# Patient Record
Sex: Female | Born: 1965 | Race: Black or African American | Hispanic: No | Smoking: Never smoker
Health system: Southern US, Community
[De-identification: ages and names within clinical notes are randomized; demographics above are authoritative.]

## PROBLEM LIST (undated history)

## (undated) DIAGNOSIS — E119 Type 2 diabetes mellitus without complications: Secondary | ICD-10-CM

## (undated) DIAGNOSIS — M1990 Unspecified osteoarthritis, unspecified site: Secondary | ICD-10-CM

## (undated) DIAGNOSIS — R600 Localized edema: Secondary | ICD-10-CM

## (undated) DIAGNOSIS — M25569 Pain in unspecified knee: Secondary | ICD-10-CM

## (undated) DIAGNOSIS — M2550 Pain in unspecified joint: Secondary | ICD-10-CM

## (undated) DIAGNOSIS — I10 Essential (primary) hypertension: Secondary | ICD-10-CM

## (undated) DIAGNOSIS — M25559 Pain in unspecified hip: Secondary | ICD-10-CM

## (undated) DIAGNOSIS — M549 Dorsalgia, unspecified: Secondary | ICD-10-CM

## (undated) DIAGNOSIS — E785 Hyperlipidemia, unspecified: Secondary | ICD-10-CM

## (undated) HISTORY — DX: Pain in unspecified hip: M25.559

## (undated) HISTORY — DX: Pain in unspecified knee: M25.569

## (undated) HISTORY — DX: Dorsalgia, unspecified: M54.9

## (undated) HISTORY — PX: APPENDECTOMY: SHX54

## (undated) HISTORY — PX: VAGINAL HYSTERECTOMY: SUR661

## (undated) HISTORY — PX: ABDOMINAL HYSTERECTOMY: SHX81

## (undated) HISTORY — DX: Pain in unspecified joint: M25.50

## (undated) HISTORY — PX: ANKLE FUSION: SHX881

## (undated) HISTORY — DX: Unspecified osteoarthritis, unspecified site: M19.90

## (undated) HISTORY — PX: TOTAL ABDOMINAL HYSTERECTOMY: SHX209

## (undated) HISTORY — DX: Localized edema: R60.0

---

## 2005-01-20 ENCOUNTER — Ambulatory Visit (HOSPITAL_COMMUNITY): Admission: RE | Admit: 2005-01-20 | Discharge: 2005-01-20 | Payer: Self-pay | Admitting: Family Medicine

## 2005-02-12 ENCOUNTER — Ambulatory Visit (HOSPITAL_COMMUNITY): Admission: RE | Admit: 2005-02-12 | Discharge: 2005-02-12 | Payer: Self-pay | Admitting: Family Medicine

## 2007-03-12 ENCOUNTER — Ambulatory Visit (HOSPITAL_COMMUNITY): Admission: RE | Admit: 2007-03-12 | Discharge: 2007-03-12 | Payer: Self-pay | Admitting: Family Medicine

## 2007-04-14 ENCOUNTER — Ambulatory Visit (HOSPITAL_COMMUNITY): Admission: RE | Admit: 2007-04-14 | Discharge: 2007-04-14 | Payer: Self-pay | Admitting: Family Medicine

## 2009-12-27 ENCOUNTER — Ambulatory Visit (HOSPITAL_COMMUNITY): Admission: RE | Admit: 2009-12-27 | Discharge: 2009-12-27 | Payer: Self-pay | Admitting: Family Medicine

## 2010-12-29 ENCOUNTER — Emergency Department (HOSPITAL_COMMUNITY)
Admission: EM | Admit: 2010-12-29 | Discharge: 2010-12-29 | Disposition: A | Discharge status: Home / Self Care | Attending: Emergency Medicine | Admitting: Emergency Medicine

## 2010-12-29 DIAGNOSIS — E785 Hyperlipidemia, unspecified: Secondary | ICD-10-CM | POA: Insufficient documentation

## 2010-12-29 DIAGNOSIS — L0501 Pilonidal cyst with abscess: Secondary | ICD-10-CM | POA: Insufficient documentation

## 2010-12-29 DIAGNOSIS — Z79899 Other long term (current) drug therapy: Secondary | ICD-10-CM | POA: Insufficient documentation

## 2011-02-26 ENCOUNTER — Other Ambulatory Visit (HOSPITAL_COMMUNITY): Payer: Self-pay | Admitting: Family Medicine

## 2011-02-26 DIAGNOSIS — Z139 Encounter for screening, unspecified: Secondary | ICD-10-CM

## 2011-03-03 ENCOUNTER — Ambulatory Visit (HOSPITAL_COMMUNITY)
Admission: RE | Admit: 2011-03-03 | Discharge: 2011-03-03 | Disposition: A | Discharge status: Home / Self Care | Source: Ambulatory Visit | Attending: Family Medicine | Admitting: Family Medicine

## 2011-03-03 DIAGNOSIS — Z1231 Encounter for screening mammogram for malignant neoplasm of breast: Secondary | ICD-10-CM | POA: Insufficient documentation

## 2011-03-03 DIAGNOSIS — Z139 Encounter for screening, unspecified: Secondary | ICD-10-CM

## 2013-03-01 ENCOUNTER — Other Ambulatory Visit (HOSPITAL_COMMUNITY): Payer: Self-pay | Admitting: Family Medicine

## 2013-03-01 DIAGNOSIS — Z139 Encounter for screening, unspecified: Secondary | ICD-10-CM

## 2013-03-07 ENCOUNTER — Ambulatory Visit (HOSPITAL_COMMUNITY)
Admission: RE | Admit: 2013-03-07 | Discharge: 2013-03-07 | Disposition: A | Discharge status: Home / Self Care | Source: Ambulatory Visit | Attending: Family Medicine | Admitting: Family Medicine

## 2013-03-07 DIAGNOSIS — Z139 Encounter for screening, unspecified: Secondary | ICD-10-CM

## 2013-03-07 DIAGNOSIS — Z1231 Encounter for screening mammogram for malignant neoplasm of breast: Secondary | ICD-10-CM | POA: Insufficient documentation

## 2014-06-04 ENCOUNTER — Encounter (HOSPITAL_COMMUNITY): Payer: Self-pay | Admitting: Emergency Medicine

## 2014-06-04 ENCOUNTER — Emergency Department (HOSPITAL_COMMUNITY)
Admission: EM | Admit: 2014-06-04 | Discharge: 2014-06-04 | Disposition: A | Discharge status: Home / Self Care | Attending: Emergency Medicine | Admitting: Emergency Medicine

## 2014-06-04 DIAGNOSIS — L03311 Cellulitis of abdominal wall: Secondary | ICD-10-CM

## 2014-06-04 DIAGNOSIS — Z79899 Other long term (current) drug therapy: Secondary | ICD-10-CM | POA: Insufficient documentation

## 2014-06-04 DIAGNOSIS — R110 Nausea: Secondary | ICD-10-CM | POA: Diagnosis not present

## 2014-06-04 DIAGNOSIS — Z792 Long term (current) use of antibiotics: Secondary | ICD-10-CM | POA: Insufficient documentation

## 2014-06-04 DIAGNOSIS — N644 Mastodynia: Secondary | ICD-10-CM | POA: Diagnosis present

## 2014-06-04 DIAGNOSIS — I10 Essential (primary) hypertension: Secondary | ICD-10-CM | POA: Insufficient documentation

## 2014-06-04 HISTORY — DX: Essential (primary) hypertension: I10

## 2014-06-04 MED ORDER — OXYCODONE-ACETAMINOPHEN 5-325 MG PO TABS: 1.0000 | ORAL_TABLET | ORAL | Status: DC | PRN

## 2014-06-04 MED ORDER — IBUPROFEN 800 MG PO TABS
800.0000 mg | ORAL_TABLET | Freq: Once | ORAL | Status: AC
  Administered 2014-06-04: 800 mg via ORAL
  Filled 2014-06-04: qty 1

## 2014-06-04 MED ORDER — DOXYCYCLINE HYCLATE 100 MG PO CAPS: 100.0000 mg | ORAL_CAPSULE | Freq: Two times a day (BID) | ORAL | Status: DC

## 2014-06-04 NOTE — ED Notes (Signed)
Pt c/o bump to right breast, pt states the area is red and painful

## 2014-06-04 NOTE — Discharge Instructions (Signed)

## 2014-06-04 NOTE — ED Provider Notes (Signed)
CSN: [id number]     Arrival date & time 06/04/14  [id number] History   First MD Initiated Contact with Patient 06/04/14 0541     Chief Complaint  Patient presents with  . Abscess      Patient is a 48 y.o. female presenting with abscess. The history is provided by the patient.  Abscess Abscess location: right breast. Abscess quality: induration, painful and redness   Duration:  1 week Progression:  Worsening Pain details:    Severity:  Moderate   Timing:  Constant   Progression:  Worsening Chronicity:  New Context: not diabetes   Relieved by:  Nothing Exacerbated by: palpation. Associated symptoms: nausea   Associated symptoms: no vomiting   Patient reports abscess just under right breast for past week No nipple discharge or discharge from abscess are reported She reports h/o abscess in past that required surgical I&D She denies h/o diabetes and reports having labs checked last week by PCP that did not reveal diabetes    Past Medical History  Diagnosis Date  . Hypertension    Past Surgical History  Procedure Laterality Date  . Abdominal hysterectomy    . Ankle fusion Right    No family history on file. History  Substance Use Topics  . Smoking status: Never Smoker   . Smokeless tobacco: Not on file  . Alcohol Use: No   OB History   Grav Para Term Preterm Abortions TAB SAB Ect Mult Living                 Review of Systems  Gastrointestinal: Positive for nausea. Negative for vomiting.  Skin: Positive for color change.      Allergies  Review of patient's allergies indicates no known allergies.  Home Medications   Prior to Admission medications   Medication Sig Start Date End Date Taking? Authorizing Provider  valsartan-hydrochlorothiazide (DIOVAN-HCT) 160-12.5 MG per tablet Take 1 tablet by mouth daily.   Yes Historical Provider, MD  doxycycline (VIBRAMYCIN) 100 MG capsule Take 1 capsule (100 mg total) by mouth 2 (two) times daily. 06/04/14   Joya Gaskinsonald W  Egon Dittus, MD  oxyCODONE-acetaminophen (PERCOCET/ROXICET) 5-325 MG per tablet Take 1 tablet by mouth every 4 (four) hours as needed for severe pain. 06/04/14   Joya Gaskinsonald W Matias Thurman, MD   BP 127/97  Pulse 120  Temp(Src) 99 F (37.2 C)  Resp 22  Ht 5\' 6"  (1.676 m)  Wt 120 lb (54.432 kg)  BMI 19.38 kg/m2  SpO2 98% Physical Exam CONSTITUTIONAL: Well developed/well nourished HEAD: Normocephalic/atraumatic EYES: EOMI ENMT: Mucous membranes moist NECK: supple no meningeal signs CV: S1/S2 noted, no murmurs/rubs/gallops noted LUNGS: Lungs are clear to auscultation bilaterally, no apparent distress ABDOMEN: soft, nontender, no rebound or guarding NEURO: Pt is awake/alert, moves all extremitiesx4 EXTREMITIES: pulses normal, full ROM SKIN: area of induration/erthema in the area below right breast in right upper quadrant.  No crepitus.  No fluctuance.  No drainage.  No erythematous streaking noted. - female chaperone present for exam PSYCH: no abnormalities of mood noted  ED Course  Procedures   EMERGENCY DEPARTMENT US SOFT TISSUE INTERPRETATION "Study: Limited Ultrasound of the noted body part in comments below"  INDICATIONS: Soft tissue infection views of the body part are obtained with a multi-frequency linear probe  PERFORMED BY:  Myself  IMAGES ARCHIVED?: Yes  SIDE:Right   BODY PART:Abdominal wall  FINDINGS: No abcess noted  LIMITATIONS:  Body Habitus  INTERPRETATION:  No abcess noted  COMMENT:  Female chaperone present  for exam  Will start on oral antibiotics, pain control, advised warm compresses and may need to return for drainage but at this time no convincing signs of abscess to drain Pt is well appearing/nontoxic and appropriate for outpatient management    MDM   Final diagnoses:  Cellulitis of abdominal wall    Nursing notes including past medical history and social history reviewed and considered in documentation     Joya Gaskinsonald W Shanta Hartner, MD 06/04/14  [phone]

## 2015-03-21 ENCOUNTER — Other Ambulatory Visit (HOSPITAL_COMMUNITY): Payer: Self-pay | Admitting: Family Medicine

## 2015-03-21 DIAGNOSIS — Z1231 Encounter for screening mammogram for malignant neoplasm of breast: Secondary | ICD-10-CM

## 2015-03-28 ENCOUNTER — Ambulatory Visit (HOSPITAL_COMMUNITY)
Admission: RE | Admit: 2015-03-28 | Discharge: 2015-03-28 | Disposition: A | Discharge status: Home / Self Care | Source: Ambulatory Visit | Attending: Family Medicine | Admitting: Family Medicine

## 2015-03-28 DIAGNOSIS — Z1231 Encounter for screening mammogram for malignant neoplasm of breast: Secondary | ICD-10-CM | POA: Diagnosis not present

## 2016-03-11 ENCOUNTER — Other Ambulatory Visit (HOSPITAL_COMMUNITY): Payer: Self-pay | Admitting: Family Medicine

## 2016-03-11 DIAGNOSIS — Z1231 Encounter for screening mammogram for malignant neoplasm of breast: Secondary | ICD-10-CM

## 2016-03-20 ENCOUNTER — Emergency Department (HOSPITAL_COMMUNITY)

## 2016-03-20 ENCOUNTER — Encounter (HOSPITAL_COMMUNITY): Payer: Self-pay | Admitting: Emergency Medicine

## 2016-03-20 ENCOUNTER — Inpatient Hospital Stay (HOSPITAL_COMMUNITY)
Admission: EM | Admit: 2016-03-20 | Discharge: 2016-03-24 | DRG: 418 | Disposition: A | Discharge status: Home / Self Care | Attending: Family Medicine | Admitting: Family Medicine

## 2016-03-20 DIAGNOSIS — I10 Essential (primary) hypertension: Secondary | ICD-10-CM | POA: Diagnosis not present

## 2016-03-20 DIAGNOSIS — D72829 Elevated white blood cell count, unspecified: Secondary | ICD-10-CM | POA: Diagnosis not present

## 2016-03-20 DIAGNOSIS — Z7984 Long term (current) use of oral hypoglycemic drugs: Secondary | ICD-10-CM | POA: Diagnosis not present

## 2016-03-20 DIAGNOSIS — K819 Cholecystitis, unspecified: Secondary | ICD-10-CM | POA: Diagnosis present

## 2016-03-20 DIAGNOSIS — E0801 Diabetes mellitus due to underlying condition with hyperosmolarity with coma: Secondary | ICD-10-CM

## 2016-03-20 DIAGNOSIS — E1165 Type 2 diabetes mellitus with hyperglycemia: Secondary | ICD-10-CM | POA: Diagnosis not present

## 2016-03-20 DIAGNOSIS — Z794 Long term (current) use of insulin: Secondary | ICD-10-CM

## 2016-03-20 DIAGNOSIS — E1159 Type 2 diabetes mellitus with other circulatory complications: Secondary | ICD-10-CM | POA: Diagnosis present

## 2016-03-20 DIAGNOSIS — E785 Hyperlipidemia, unspecified: Secondary | ICD-10-CM | POA: Diagnosis present

## 2016-03-20 DIAGNOSIS — K8000 Calculus of gallbladder with acute cholecystitis without obstruction: Secondary | ICD-10-CM | POA: Diagnosis not present

## 2016-03-20 DIAGNOSIS — K219 Gastro-esophageal reflux disease without esophagitis: Secondary | ICD-10-CM | POA: Diagnosis present

## 2016-03-20 DIAGNOSIS — Z6841 Body Mass Index (BMI) 40.0 and over, adult: Secondary | ICD-10-CM

## 2016-03-20 DIAGNOSIS — E0800 Diabetes mellitus due to underlying condition with hyperosmolarity without nonketotic hyperglycemic-hyperosmolar coma (NKHHC): Secondary | ICD-10-CM

## 2016-03-20 DIAGNOSIS — E119 Type 2 diabetes mellitus without complications: Secondary | ICD-10-CM

## 2016-03-20 DIAGNOSIS — K810 Acute cholecystitis: Secondary | ICD-10-CM | POA: Diagnosis present

## 2016-03-20 HISTORY — DX: Type 2 diabetes mellitus without complications: E11.9

## 2016-03-20 HISTORY — DX: Hyperlipidemia, unspecified: E78.5

## 2016-03-20 LAB — CBC WITH DIFFERENTIAL/PLATELET
BASOS ABS: 0 10*3/uL (ref 0.0–0.1)
BASOS PCT: 0 %
EOS PCT: 0 %
Eosinophils Absolute: 0 10*3/uL (ref 0.0–0.7)
HCT: 36.4 % (ref 36.0–46.0)
Hemoglobin: 11.9 g/dL — ABNORMAL LOW (ref 12.0–15.0)
LYMPHS PCT: 11 %
Lymphs Abs: 1.7 10*3/uL (ref 0.7–4.0)
MCH: 30.3 pg (ref 26.0–34.0)
MCHC: 32.7 g/dL (ref 30.0–36.0)
MCV: 92.6 fL (ref 78.0–100.0)
Monocytes Absolute: 1.4 10*3/uL — ABNORMAL HIGH (ref 0.1–1.0)
Monocytes Relative: 9 %
NEUTROS ABS: 12.1 10*3/uL — AB (ref 1.7–7.7)
Neutrophils Relative %: 80 %
PLATELETS: 198 10*3/uL (ref 150–400)
RBC: 3.93 MIL/uL (ref 3.87–5.11)
RDW: 13.4 % (ref 11.5–15.5)
WBC: 15.3 10*3/uL — AB (ref 4.0–10.5)

## 2016-03-20 LAB — GLUCOSE, CAPILLARY: GLUCOSE-CAPILLARY: 200 mg/dL — AB (ref 65–99)

## 2016-03-20 LAB — COMPREHENSIVE METABOLIC PANEL
ALT: 19 U/L (ref 14–54)
AST: 17 U/L (ref 15–41)
Albumin: 3.9 g/dL (ref 3.5–5.0)
Alkaline Phosphatase: 48 U/L (ref 38–126)
Anion gap: 9 (ref 5–15)
BUN: 9 mg/dL (ref 6–20)
CHLORIDE: 96 mmol/L — AB (ref 101–111)
CO2: 25 mmol/L (ref 22–32)
CREATININE: 0.6 mg/dL (ref 0.44–1.00)
Calcium: 8.6 mg/dL — ABNORMAL LOW (ref 8.9–10.3)
Glucose, Bld: 218 mg/dL — ABNORMAL HIGH (ref 65–99)
POTASSIUM: 3.2 mmol/L — AB (ref 3.5–5.1)
Sodium: 130 mmol/L — ABNORMAL LOW (ref 135–145)
TOTAL PROTEIN: 8 g/dL (ref 6.5–8.1)
Total Bilirubin: 0.9 mg/dL (ref 0.3–1.2)

## 2016-03-20 LAB — LIPASE, BLOOD: LIPASE: 17 U/L (ref 11–51)

## 2016-03-20 MED ORDER — ONDANSETRON HCL 4 MG/2ML IJ SOLN: 4.0000 mg | Freq: Four times a day (QID) | INTRAMUSCULAR | Status: DC | PRN

## 2016-03-20 MED ORDER — POTASSIUM CHLORIDE 10 MEQ/100ML IV SOLN
INTRAVENOUS | Status: AC
  Administered 2016-03-21: 10 meq
  Filled 2016-03-20: qty 100

## 2016-03-20 MED ORDER — PIPERACILLIN-TAZOBACTAM 3.375 G IVPB 30 MIN
3.3750 g | Freq: Once | INTRAVENOUS | Status: AC
  Administered 2016-03-20: 3.375 g via INTRAVENOUS
  Filled 2016-03-20: qty 50

## 2016-03-20 MED ORDER — POTASSIUM CHLORIDE 10 MEQ/100ML IV SOLN
10.0000 meq | INTRAVENOUS | Status: DC
  Administered 2016-03-20 (×2): 10 meq via INTRAVENOUS
  Filled 2016-03-20: qty 100

## 2016-03-20 MED ORDER — PIPERACILLIN-TAZOBACTAM 3.375 G IVPB
3.3750 g | Freq: Three times a day (TID) | INTRAVENOUS | Status: DC
  Administered 2016-03-21 – 2016-03-24 (×11): 3.375 g via INTRAVENOUS
  Filled 2016-03-20 (×11): qty 50

## 2016-03-20 MED ORDER — POTASSIUM CHLORIDE IN NACL 20-0.9 MEQ/L-% IV SOLN: INTRAVENOUS | Status: AC

## 2016-03-20 MED ORDER — ONDANSETRON HCL 4 MG PO TABS
4.0000 mg | ORAL_TABLET | Freq: Four times a day (QID) | ORAL | Status: DC | PRN
  Administered 2016-03-24: 4 mg via ORAL
  Filled 2016-03-20: qty 1

## 2016-03-20 MED ORDER — ENOXAPARIN SODIUM 40 MG/0.4ML ~~LOC~~ SOLN
40.0000 mg | Freq: Once | SUBCUTANEOUS | Status: AC
  Administered 2016-03-21: 40 mg via SUBCUTANEOUS
  Filled 2016-03-20: qty 0.4

## 2016-03-20 MED ORDER — SODIUM CHLORIDE 0.9 % IV SOLN
INTRAVENOUS | Status: AC
  Administered 2016-03-21: 03:00:00 via INTRAVENOUS

## 2016-03-20 MED ORDER — KETOROLAC TROMETHAMINE 30 MG/ML IJ SOLN
30.0000 mg | Freq: Four times a day (QID) | INTRAMUSCULAR | Status: DC | PRN
  Filled 2016-03-20: qty 1

## 2016-03-20 MED ORDER — INSULIN ASPART 100 UNIT/ML ~~LOC~~ SOLN
0.0000 [IU] | SUBCUTANEOUS | Status: DC
  Administered 2016-03-20: 2 [IU] via SUBCUTANEOUS
  Administered 2016-03-21 (×2): 3 [IU] via SUBCUTANEOUS

## 2016-03-20 NOTE — ED Notes (Signed)
Patient transported to Ultrasound 

## 2016-03-20 NOTE — Progress Notes (Signed)
ANTIBIOTIC CONSULT NOTE-Preliminary  Pharmacy Consult for Zosyn Indication: Intra-abdominal Infection   No Known Allergies  Patient Measurements: Height: 5\' 6"  (167.6 cm) Weight: 260 lb (117.9 kg) IBW/kg (Calculated) : 59.3  Vital Signs: Temp: 100.8 F (38.2 C) (08/10 2210) Temp Source: Oral (08/10 2210) BP: 133/79 (08/10 2210) Pulse Rate: 93 (08/10 2210)  Labs:  Recent Labs  03/20/16 1840  WBC 15.3*  HGB 11.9*  PLT 198  CREATININE 0.60    Estimated Creatinine Clearance: 111.1 mL/min (by C-G formula based on SCr of 0.8 mg/dL).  No results for input(s): VANCOTROUGH, VANCOPEAK, VANCORANDOM, GENTTROUGH, GENTPEAK, GENTRANDOM, TOBRATROUGH, TOBRAPEAK, TOBRARND, AMIKACINPEAK, AMIKACINTROU, AMIKACIN in the last 72 hours.   Microbiology: No results found for this or any previous visit (from the past 720 hour(s)).  Medical History: Past Medical History:  Diagnosis Date  . Diabetes mellitus without complication (HCC)   . Hyperlipidemia   . Hypertension    Medications:  Prescriptions Prior to Admission  Medication Sig Dispense Refill Last Dose  . aluminum hydroxide-magnesium carbonate (GAVISCON) 95-358 MG/15ML SUSP Take 30 mLs by mouth as needed for indigestion or heartburn.   03/20/2016  . metFORMIN (GLUCOPHAGE) 500 MG tablet Take 500 mg by mouth 2 (two) times daily with a meal.   03/20/2016  . omeprazole (PRILOSEC) 20 MG capsule Take 20 mg by mouth daily.   03/20/2016  . rosuvastatin (CRESTOR) 10 MG tablet Take 10 mg by mouth daily.   03/20/2016  . valsartan-hydrochlorothiazide (DIOVAN-HCT) 160-12.5 MG per tablet Take 1 tablet by mouth daily.   03/20/2016    Assessment: Okay for Protocol, initial dose given in ED.  Surgery likely planned for 03/21/2016.  Goal of Therapy:  Eradicate infection.   Plan:  Preliminary review of pertinent patient information completed.  Protocol will be initiated with Zosyn 3.375gm IV every 8 hours. Follow-up micro data, labs, vitals. Jeani HawkingAnnie  [facility] clinical pharmacist will complete review during morning rounds to assess patient and finalize treatment regimen.  Mady GemmaHayes, Lorcan Shelp R, [facility] HospitalRPH 03/20/2016,11:01 PM

## 2016-03-20 NOTE — ED Notes (Signed)
Report called to Candice [id number], RN.  Pt is stable and ready for transport.

## 2016-03-20 NOTE — H&P (Signed)
History and Physical    Terri [name] ZOX:[id number] DOB: 01-23-1966 DOA: 03/20/2016  PCP: Isabella StallingNDIEGO,RICHARD M, MD  Patient coming from: home  Chief Complaint: abdominal pain  HPI: Terri [name] is a 50 y.o. female with medical history significant of NIDDM, HTN comes in with 2 days of progressive worsening ruq abdominal pain that radiates to her back worse postprandially with associated vomiting today.  Pt thought it was her GERD but it kept getting worse.  Denies any fevers. No diarrhea.  Still has gallbladder.  Pt found to have acute cholecystitis.   Review of Systems: As per HPI otherwise 10 point review of systems negative.   Past Medical History:  Diagnosis Date  . Hypertension     Past Surgical History:  Procedure Laterality Date  . ABDOMINAL HYSTERECTOMY    . ANKLE FUSION Right      reports that she has never smoked. She has never used smokeless tobacco. She reports that she does not drink alcohol or use drugs.  No Known Allergies  No family history on file.  Prior to Admission medications   Medication Sig Start Date End Date Taking? Authorizing Provider  aluminum hydroxide-magnesium carbonate (GAVISCON) 95-358 MG/15ML SUSP Take 30 mLs by mouth as needed for indigestion or heartburn.   Yes Historical Provider, MD  metFORMIN (GLUCOPHAGE) 500 MG tablet Take 500 mg by mouth 2 (two) times daily with a meal.   Yes Historical Provider, MD  omeprazole (PRILOSEC) 20 MG capsule Take 20 mg by mouth daily.   Yes Historical Provider, MD  rosuvastatin (CRESTOR) 10 MG tablet Take 10 mg by mouth daily.   Yes Historical Provider, MD  valsartan-hydrochlorothiazide (DIOVAN-HCT) 160-12.5 MG per tablet Take 1 tablet by mouth daily.   Yes Historical Provider, MD    Physical Exam: Vitals:   03/20/16 1752 03/20/16 1840 03/20/16 1900 03/20/16 2014  BP: 149/93 142/85 155/90 150/88  Pulse: 100 110 89 97  Resp: 20 18  16   Temp: 99.4 F (37.4 C)     TempSrc: Oral     SpO2:  98% 95% 99% 98%  Weight: 117.9 kg (260 lb)     Height: 5\' 6"  (1.676 m)         Constitutional: NAD, calm, comfortable Vitals:   03/20/16 1752 03/20/16 1840 03/20/16 1900 03/20/16 2014  BP: 149/93 142/85 155/90 150/88  Pulse: 100 110 89 97  Resp: 20 18  16   Temp: 99.4 F (37.4 C)     TempSrc: Oral     SpO2: 98% 95% 99% 98%  Weight: 117.9 kg (260 lb)     Height: 5\' 6"  (1.676 m)      Eyes: PERRL, lids and conjunctivae normal ENMT: Mucous membranes are moist. Posterior pharynx clear of any exudate or lesions.Normal dentition.  Neck: normal, supple, no masses, no thyromegaly Respiratory: clear to auscultation bilaterally, no wheezing, no crackles. Normal respiratory effort. No accessory muscle use.  Cardiovascular: Regular rate and rhythm, no murmurs / rubs / gallops. No extremity edema. 2+ pedal pulses. No carotid bruits.  Abdomen: ruq tenderness, no masses palpated. No hepatosplenomegaly. Bowel sounds positive.  Musculoskeletal: no clubbing / cyanosis. No joint deformity upper and lower extremities. Good ROM, no contractures. Normal muscle tone.  Skin: no rashes, lesions, ulcers. No induration Neurologic: CN 2-12 grossly intact. Sensation intact, DTR normal. Strength 5/5 in all 4.  Psychiatric: Normal judgment and insight. Alert and oriented x 3. Normal mood.    Labs on Admission: I have personally reviewed following  labs and imaging studies  CBC:  Recent Labs Lab 03/20/16 1840  WBC 15.3*  NEUTROABS 12.1*  HGB 11.9*  HCT 36.4  MCV 92.6  PLT 198   Basic Metabolic Panel:  Recent Labs Lab 03/20/16 1840  NA 130*  K 3.2*  CL 96*  CO2 25  GLUCOSE 218*  BUN 9  CREATININE 0.60  CALCIUM 8.6*   GFR: Estimated Creatinine Clearance: 111.1 mL/min (by C-G formula based on SCr of 0.8 mg/dL). Liver Function Tests:  Recent Labs Lab 03/20/16 1840  AST 17  ALT 19  ALKPHOS 48  BILITOT 0.9  PROT 8.0  ALBUMIN 3.9    Recent Labs Lab 03/20/16 1840  LIPASE 17     Radiological Exams on Admission: Dg Chest 2 View  Result Date: 03/20/2016 CLINICAL DATA:  Right thoracic pain.  Heartburn. EXAM: CHEST  2 VIEW COMPARISON:  None. FINDINGS: The heart size and mediastinal contours are within normal limits. Both lungs are clear. The visualized skeletal structures are unremarkable. IMPRESSION: No active cardiopulmonary disease. Electronically Signed   By: Ted Mcalpine M.D.   On: 03/20/2016 19:11   US Abdomen Limited  Result Date: 03/20/2016 CLINICAL DATA:  Nausea for 4 days. EXAM: US ABDOMEN LIMITED - RIGHT UPPER QUADRANT COMPARISON:  None. FINDINGS: Gallbladder: Multiple small stones are identified in the gallbladder measuring up to 0.6 cm. There is pericholecystic fluid and gallbladder wall thickening. Sonographer reports negative Murphy's sign. There may also be a 0.8 cm gallbladder polyp. Common bile duct: Diameter: 0.7 cm Liver: The liver demonstrates increased echogenicity without focal lesion. IMPRESSION: Although sonographer of reports negative Murphy's sign, stones, wall thickening and pericholecystic fluid are compatible with acute cholecystitis. Fatty infiltration of the liver. The common bile duct is at the upper limits of normal measuring 0.7 cm but no stone is seen within the duct. Electronically Signed   By: Drusilla Kanner M.D.   On: 03/20/2016 18:35    Assessment/Plan 50 yo female with acute cholecystitis  Principal Problem:   Cholecystitis- dr Lovell Sheehan called and requested medicine admit, keep npo for surgery in the am.  Can likely go home after surgery tomorrow if all goes well.  Ivf.  Prn zofran and toradol ordered.  Zosyn.  abd exam benign and nonacute at this time.  Active Problems:   Hypertension- stable   DM (diabetes mellitus) (HCC)- hold metformin, ssi   obs on medical   DVT prophylaxis:   scd Code Status:  Full code  DAVID,RACHAL A MD [facility]  If 7PM-7AM, please contact  night-coverage [email or web address] Password [email or web address]  03/20/2016, 8:52 PM

## 2016-03-20 NOTE — ED Provider Notes (Addendum)
[facility]-EMERGENCY DEPT Provider Note   CSN: [id number] Arrival date & time: 03/20/16  1746  First Provider Contact:  First MD Initiated Contact with Patient 03/20/16 1755        History   Chief Complaint Chief Complaint  Patient presents with  . Gastroesophageal Reflux  . Back Pain    right    HPI Terri PewBarbara M Bossler is a 50 y.o. female.  HPI Patient presents with upper abdominal pain and vomiting 2 days. States it started after eating a fairly steak. Numerous episodes of vomiting yesterday. Patient states she has not had very much oral intake today. Denies current nausea. She does have upper abdominal pain but this is improved. She also is having right sided thoracic back pain started after vomiting. Denies cough or shortness of breath. Previous history of gastroesophageal reflux disease. She is currently on PPI. States she takes it regularly. Has had appendectomy in the past. No new lower extremity swelling or pain. No urinary symptoms. Past Medical History:  Diagnosis Date  . Hypertension     There are no active problems to display for this patient.   Past Surgical History:  Procedure Laterality Date  . ABDOMINAL HYSTERECTOMY    . ANKLE FUSION Right     OB History    No data available       Home Medications    Prior to Admission medications   Medication Sig Start Date End Date Taking? Authorizing Provider  aluminum hydroxide-magnesium carbonate (GAVISCON) 95-358 MG/15ML SUSP Take 30 mLs by mouth as needed for indigestion or heartburn.   Yes Historical Provider, MD  metFORMIN (GLUCOPHAGE) 500 MG tablet Take 500 mg by mouth 2 (two) times daily with a meal.   Yes Historical Provider, MD  omeprazole (PRILOSEC) 20 MG capsule Take 20 mg by mouth daily.   Yes Historical Provider, MD  rosuvastatin (CRESTOR) 10 MG tablet Take 10 mg by mouth daily.   Yes Historical Provider, MD  valsartan-hydrochlorothiazide (DIOVAN-HCT) 160-12.5 MG per tablet Take 1 tablet by mouth daily.    Yes Historical Provider, MD    Family History No family history on file.  Social History Social History  Substance Use Topics  . Smoking status: Never Smoker  . Smokeless tobacco: Never Used  . Alcohol use No     Allergies   Review of patient's allergies indicates no known allergies.   Review of Systems Review of Systems  Constitutional: Negative for chills and fever.  Respiratory: Negative for cough and shortness of breath.   Cardiovascular: Negative for chest pain.  Gastrointestinal: Positive for abdominal pain, nausea and vomiting. Negative for blood in stool, constipation and diarrhea.  Genitourinary: Negative for dysuria, flank pain, frequency and hematuria.  Musculoskeletal: Positive for back pain and myalgias. Negative for neck pain and neck stiffness.  Skin: Negative for rash and wound.  Neurological: Negative for dizziness, weakness, light-headedness, numbness and headaches.  All other systems reviewed and are negative.    Physical Exam Updated Vital Signs BP 155/90   Pulse 89   Temp 99.4 F (37.4 C) (Oral)   Resp 18   Ht 5\' 6"  (1.676 m)   Wt 260 lb (117.9 kg)   SpO2 99%   BMI 41.97 kg/m   Physical Exam  Constitutional: She is oriented to person, place, and time. She appears well-developed and well-nourished.  HENT:  Head: Normocephalic and atraumatic.  Mouth/Throat: Oropharynx is clear and moist.  Eyes: EOM are normal. Pupils are equal, round, and reactive to light.  Neck: Normal range of motion. Neck supple.  Cardiovascular: Normal rate and regular rhythm.   Pulmonary/Chest: Effort normal and breath sounds normal.  Abdominal: Soft. Bowel sounds are normal. There is tenderness (tender to palpation in the epigastric and right upper quadrants.). There is no rebound and no guarding.  Musculoskeletal: Normal range of motion. She exhibits no edema or tenderness.  No midline thoracic or lumbar tenderness. No CVA tenderness bilaterally.  Neurological: She  is alert and oriented to person, place, and time.  Results showed is without deficit. Sensation is fully intact.  Skin: Skin is warm and dry. No rash noted. No erythema.  Psychiatric: She has a normal mood and affect. Her behavior is normal.  Nursing note and vitals reviewed.    ED Treatments / Results  Labs (all labs ordered are listed, but only abnormal results are displayed) Labs Reviewed  COMPREHENSIVE METABOLIC PANEL - Abnormal; Notable for the following:       Result Value   Sodium 130 (*)    Potassium 3.2 (*)    Chloride 96 (*)    Glucose, Bld 218 (*)    Calcium 8.6 (*)    All other components within normal limits  CBC WITH DIFFERENTIAL/PLATELET - Abnormal; Notable for the following:    WBC 15.3 (*)    Hemoglobin 11.9 (*)    Neutro Abs 12.1 (*)    Monocytes Absolute 1.4 (*)    All other components within normal limits  LIPASE, BLOOD    EKG  EKG Interpretation None       Radiology Dg Chest 2 View  Result Date: 03/20/2016 CLINICAL DATA:  Right thoracic pain.  Heartburn. EXAM: CHEST  2 VIEW COMPARISON:  None. FINDINGS: The heart size and mediastinal contours are within normal limits. Both lungs are clear. The visualized skeletal structures are unremarkable. IMPRESSION: No active cardiopulmonary disease. Electronically Signed   By: Ted Mcalpine M.D.   On: 03/20/2016 19:11   US Abdomen Limited  Result Date: 03/20/2016 CLINICAL DATA:  Nausea for 4 days. EXAM: US ABDOMEN LIMITED - RIGHT UPPER QUADRANT COMPARISON:  None. FINDINGS: Gallbladder: Multiple small stones are identified in the gallbladder measuring up to 0.6 cm. There is pericholecystic fluid and gallbladder wall thickening. Sonographer reports negative Murphy's sign. There may also be a 0.8 cm gallbladder polyp. Common bile duct: Diameter: 0.7 cm Liver: The liver demonstrates increased echogenicity without focal lesion. IMPRESSION: Although sonographer of reports negative Murphy's sign, stones, wall  thickening and pericholecystic fluid are compatible with acute cholecystitis. Fatty infiltration of the liver. The common bile duct is at the upper limits of normal measuring 0.7 cm but no stone is seen within the duct. Electronically Signed   By: Drusilla Kanner M.D.   On: 03/20/2016 18:35    Procedures Procedures (including critical care time)  Medications Ordered in ED Medications  piperacillin-tazobactam (ZOSYN) IVPB 3.375 g (not administered)     Initial Impression / Assessment and Plan / ED Course  I have reviewed the triage vital signs and the nursing notes.  Pertinent labs & imaging results that were available during my care of the patient were reviewed by me and considered in my medical decision making (see chart for details).  Clinical Course  Patient's pain is controlled in the emergency department. No further vomiting. Discussed with Dr. Lovell Sheehan. Advised starting Zosyn and given patient nothing by mouth after midnight. Discussed with Dr. Onalee Hua will admit to MedSurg bed.   Final Clinical Impressions(s) / ED Diagnoses  Final diagnoses:  Cholecystitis    New Prescriptions New Prescriptions   No medications on file     Loren Racer, MD 03/20/16 1953    Loren Racer, MD 03/20/16 [id number]

## 2016-03-20 NOTE — ED Triage Notes (Signed)
Having heartburn yesterday but not currently having burning.  C/o right lung pain, rates pain 5/10.

## 2016-03-21 ENCOUNTER — Observation Stay (HOSPITAL_COMMUNITY): Admitting: Anesthesiology

## 2016-03-21 ENCOUNTER — Encounter (HOSPITAL_COMMUNITY): Payer: Self-pay | Admitting: Anesthesiology

## 2016-03-21 ENCOUNTER — Encounter (HOSPITAL_COMMUNITY)
Admission: EM | Disposition: A | Discharge status: Home / Self Care | Payer: Self-pay | Source: Home / Self Care | Attending: Family Medicine

## 2016-03-21 DIAGNOSIS — Z7984 Long term (current) use of oral hypoglycemic drugs: Secondary | ICD-10-CM | POA: Diagnosis not present

## 2016-03-21 DIAGNOSIS — I10 Essential (primary) hypertension: Secondary | ICD-10-CM | POA: Diagnosis present

## 2016-03-21 DIAGNOSIS — K810 Acute cholecystitis: Secondary | ICD-10-CM | POA: Diagnosis present

## 2016-03-21 DIAGNOSIS — E1165 Type 2 diabetes mellitus with hyperglycemia: Secondary | ICD-10-CM | POA: Diagnosis present

## 2016-03-21 DIAGNOSIS — K8000 Calculus of gallbladder with acute cholecystitis without obstruction: Secondary | ICD-10-CM | POA: Diagnosis present

## 2016-03-21 DIAGNOSIS — K219 Gastro-esophageal reflux disease without esophagitis: Secondary | ICD-10-CM | POA: Diagnosis present

## 2016-03-21 DIAGNOSIS — D72829 Elevated white blood cell count, unspecified: Secondary | ICD-10-CM | POA: Diagnosis present

## 2016-03-21 DIAGNOSIS — K819 Cholecystitis, unspecified: Secondary | ICD-10-CM | POA: Diagnosis present

## 2016-03-21 DIAGNOSIS — Z6841 Body Mass Index (BMI) 40.0 and over, adult: Secondary | ICD-10-CM | POA: Diagnosis not present

## 2016-03-21 DIAGNOSIS — E785 Hyperlipidemia, unspecified: Secondary | ICD-10-CM | POA: Diagnosis present

## 2016-03-21 HISTORY — PX: CHOLECYSTECTOMY: SHX55

## 2016-03-21 LAB — BASIC METABOLIC PANEL
Anion gap: 12 (ref 5–15)
BUN: 9 mg/dL (ref 6–20)
CHLORIDE: 96 mmol/L — AB (ref 101–111)
CO2: 27 mmol/L (ref 22–32)
CREATININE: 0.67 mg/dL (ref 0.44–1.00)
Calcium: 8.8 mg/dL — ABNORMAL LOW (ref 8.9–10.3)
GFR calc Af Amer: >60 mL/min (ref >60)
GFR calc non Af Amer: >60 mL/min (ref >60)
GLUCOSE: 226 mg/dL — AB (ref 65–99)
POTASSIUM: 3.5 mmol/L (ref 3.5–5.1)
Sodium: 135 mmol/L (ref 135–145)

## 2016-03-21 LAB — CBC
HEMATOCRIT: 34.7 % — AB (ref 36.0–46.0)
Hemoglobin: 11.3 g/dL — ABNORMAL LOW (ref 12.0–15.0)
MCH: 30.4 pg (ref 26.0–34.0)
MCHC: 32.6 g/dL (ref 30.0–36.0)
MCV: 93.3 fL (ref 78.0–100.0)
Platelets: 198 10*3/uL (ref 150–400)
RBC: 3.72 MIL/uL — ABNORMAL LOW (ref 3.87–5.11)
RDW: 13.7 % (ref 11.5–15.5)
WBC: 15.1 10*3/uL — ABNORMAL HIGH (ref 4.0–10.5)

## 2016-03-21 LAB — GLUCOSE, CAPILLARY
Glucose-Capillary: 232 mg/dL — ABNORMAL HIGH (ref 65–99)
Glucose-Capillary: 217 mg/dL — ABNORMAL HIGH (ref 65–99)
Glucose-Capillary: 190 mg/dL — ABNORMAL HIGH (ref 65–99)
Glucose-Capillary: 242 mg/dL — ABNORMAL HIGH (ref 65–99)
Glucose-Capillary: 243 mg/dL — ABNORMAL HIGH (ref 65–99)
Glucose-Capillary: 247 mg/dL — ABNORMAL HIGH (ref 65–99)

## 2016-03-21 LAB — HEMOGLOBIN AND HEMATOCRIT, BLOOD
HCT: 34.3 % — ABNORMAL LOW (ref 36.0–46.0)
HEMOGLOBIN: 10.7 g/dL — AB (ref 12.0–15.0)

## 2016-03-21 SURGERY — LAPAROSCOPIC CHOLECYSTECTOMY
Anesthesia: General

## 2016-03-21 MED ORDER — SIMETHICONE 80 MG PO CHEW: 40.0000 mg | CHEWABLE_TABLET | Freq: Four times a day (QID) | ORAL | Status: DC | PRN

## 2016-03-21 MED ORDER — ARTIFICIAL TEARS OP OINT
TOPICAL_OINTMENT | OPHTHALMIC | Status: AC
  Filled 2016-03-21: qty 7

## 2016-03-21 MED ORDER — GLYCOPYRROLATE 0.2 MG/ML IJ SOLN
INTRAMUSCULAR | Status: DC | PRN
  Administered 2016-03-21: 0.6 mg via INTRAVENOUS

## 2016-03-21 MED ORDER — GLYCOPYRROLATE 0.2 MG/ML IJ SOLN
INTRAMUSCULAR | Status: AC
  Filled 2016-03-21: qty 1

## 2016-03-21 MED ORDER — DIPHENHYDRAMINE HCL 50 MG/ML IJ SOLN: 25.0000 mg | Freq: Four times a day (QID) | INTRAMUSCULAR | Status: DC | PRN

## 2016-03-21 MED ORDER — METOPROLOL TARTRATE 5 MG/5ML IV SOLN
INTRAVENOUS | Status: AC
  Filled 2016-03-21: qty 5

## 2016-03-21 MED ORDER — ACETAMINOPHEN 325 MG PO TABS
650.0000 mg | ORAL_TABLET | Freq: Four times a day (QID) | ORAL | Status: DC | PRN
  Administered 2016-03-22 – 2016-03-23 (×2): 650 mg via ORAL
  Filled 2016-03-21 (×2): qty 2

## 2016-03-21 MED ORDER — METOPROLOL TARTRATE 5 MG/5ML IV SOLN
INTRAVENOUS | Status: DC | PRN
  Administered 2016-03-21 (×3): 1 mg via INTRAVENOUS
  Administered 2016-03-21: 2 mg via INTRAVENOUS

## 2016-03-21 MED ORDER — PROPOFOL 10 MG/ML IV BOLUS
INTRAVENOUS | Status: AC
  Filled 2016-03-21: qty 20

## 2016-03-21 MED ORDER — DIPHENHYDRAMINE HCL 25 MG PO CAPS: 25.0000 mg | ORAL_CAPSULE | Freq: Four times a day (QID) | ORAL | Status: DC | PRN

## 2016-03-21 MED ORDER — POVIDONE-IODINE 10 % OINT PACKET
TOPICAL_OINTMENT | CUTANEOUS | Status: DC | PRN
  Administered 2016-03-21: 1 via TOPICAL

## 2016-03-21 MED ORDER — ROCURONIUM BROMIDE 50 MG/5ML IV SOLN
INTRAVENOUS | Status: AC
  Filled 2016-03-21: qty 1

## 2016-03-21 MED ORDER — LIDOCAINE HCL (PF) 1 % IJ SOLN
INTRAMUSCULAR | Status: AC
  Filled 2016-03-21: qty 5

## 2016-03-21 MED ORDER — SEVOFLURANE IN SOLN
RESPIRATORY_TRACT | Status: AC
  Filled 2016-03-21: qty 250

## 2016-03-21 MED ORDER — FENTANYL CITRATE (PF) 100 MCG/2ML IJ SOLN
INTRAMUSCULAR | Status: AC
  Filled 2016-03-21: qty 2

## 2016-03-21 MED ORDER — CHLORHEXIDINE GLUCONATE CLOTH 2 % EX PADS
6.0000 | MEDICATED_PAD | Freq: Once | CUTANEOUS | Status: AC
  Administered 2016-03-21: 6 via TOPICAL

## 2016-03-21 MED ORDER — HYDROMORPHONE HCL 1 MG/ML IJ SOLN: 0.2500 mg | INTRAMUSCULAR | Status: DC | PRN

## 2016-03-21 MED ORDER — NEOSTIGMINE METHYLSULFATE 10 MG/10ML IV SOLN
INTRAVENOUS | Status: AC
  Filled 2016-03-21: qty 1

## 2016-03-21 MED ORDER — MIDAZOLAM HCL 2 MG/2ML IJ SOLN
INTRAMUSCULAR | Status: AC
  Filled 2016-03-21: qty 2

## 2016-03-21 MED ORDER — KETOROLAC TROMETHAMINE 30 MG/ML IJ SOLN
30.0000 mg | Freq: Once | INTRAMUSCULAR | Status: AC
  Administered 2016-03-21: 30 mg via INTRAVENOUS

## 2016-03-21 MED ORDER — DEXAMETHASONE SODIUM PHOSPHATE 4 MG/ML IJ SOLN
4.0000 mg | Freq: Once | INTRAMUSCULAR | Status: AC
  Administered 2016-03-21: 4 mg via INTRAVENOUS

## 2016-03-21 MED ORDER — BUPIVACAINE HCL (PF) 0.5 % IJ SOLN
INTRAMUSCULAR | Status: AC
  Filled 2016-03-21: qty 30

## 2016-03-21 MED ORDER — PROPOFOL 10 MG/ML IV BOLUS
INTRAVENOUS | Status: DC | PRN
  Administered 2016-03-21: 200 mg via INTRAVENOUS

## 2016-03-21 MED ORDER — NEOSTIGMINE METHYLSULFATE 10 MG/10ML IV SOLN
INTRAVENOUS | Status: DC | PRN
  Administered 2016-03-21: 4 mg via INTRAVENOUS

## 2016-03-21 MED ORDER — ACETAMINOPHEN 650 MG RE SUPP: 650.0000 mg | Freq: Four times a day (QID) | RECTAL | Status: DC | PRN

## 2016-03-21 MED ORDER — VALSARTAN-HYDROCHLOROTHIAZIDE 160-12.5 MG PO TABS: 1.0000 | ORAL_TABLET | Freq: Every day | ORAL | Status: DC

## 2016-03-21 MED ORDER — DEXAMETHASONE SODIUM PHOSPHATE 4 MG/ML IJ SOLN
INTRAMUSCULAR | Status: AC
  Filled 2016-03-21: qty 1

## 2016-03-21 MED ORDER — HEMOSTATIC AGENTS (NO CHARGE) OPTIME
TOPICAL | Status: DC | PRN
  Administered 2016-03-21 (×2): 1 via TOPICAL

## 2016-03-21 MED ORDER — LORAZEPAM 2 MG/ML IJ SOLN: 1.0000 mg | INTRAMUSCULAR | Status: DC | PRN

## 2016-03-21 MED ORDER — ONDANSETRON HCL 4 MG/2ML IJ SOLN
INTRAMUSCULAR | Status: AC
  Filled 2016-03-21: qty 2

## 2016-03-21 MED ORDER — POVIDONE-IODINE 10 % EX OINT
TOPICAL_OINTMENT | CUTANEOUS | Status: AC
  Filled 2016-03-21: qty 1

## 2016-03-21 MED ORDER — SODIUM CHLORIDE 0.9 % IR SOLN
Status: DC | PRN
  Administered 2016-03-21 (×2): 3000 mL
  Administered 2016-03-21: 1000 mL

## 2016-03-21 MED ORDER — ROCURONIUM 10MG/ML (10ML) SYRINGE FOR MEDFUSION PUMP - OPTIME
INTRAVENOUS | Status: DC | PRN
  Administered 2016-03-21 (×2): 10 mg via INTRAVENOUS
  Administered 2016-03-21: 22 mg via INTRAVENOUS
  Administered 2016-03-21: 8 mg via INTRAVENOUS
  Administered 2016-03-21 (×2): 10 mg via INTRAVENOUS

## 2016-03-21 MED ORDER — INSULIN ASPART 100 UNIT/ML ~~LOC~~ SOLN: 0.0000 [IU] | SUBCUTANEOUS | Status: DC

## 2016-03-21 MED ORDER — GLYCOPYRROLATE 0.2 MG/ML IJ SOLN
INTRAMUSCULAR | Status: AC
  Filled 2016-03-21: qty 4

## 2016-03-21 MED ORDER — INSULIN ASPART 100 UNIT/ML ~~LOC~~ SOLN
0.0000 [IU] | SUBCUTANEOUS | Status: DC
  Administered 2016-03-21 – 2016-03-22 (×2): 3 [IU] via SUBCUTANEOUS
  Administered 2016-03-22: 5 [IU] via SUBCUTANEOUS
  Administered 2016-03-22: 3 [IU] via SUBCUTANEOUS

## 2016-03-21 MED ORDER — HYDROMORPHONE HCL 1 MG/ML IJ SOLN
1.0000 mg | INTRAMUSCULAR | Status: DC | PRN
  Administered 2016-03-22 – 2016-03-23 (×2): 1 mg via INTRAVENOUS
  Filled 2016-03-21 (×3): qty 1

## 2016-03-21 MED ORDER — SUCCINYLCHOLINE CHLORIDE 20 MG/ML IJ SOLN
INTRAMUSCULAR | Status: AC
  Filled 2016-03-21: qty 1

## 2016-03-21 MED ORDER — MIDAZOLAM HCL 2 MG/2ML IJ SOLN
1.0000 mg | INTRAMUSCULAR | Status: DC | PRN
  Administered 2016-03-21: 2 mg via INTRAVENOUS

## 2016-03-21 MED ORDER — BUPIVACAINE HCL (PF) 0.5 % IJ SOLN
INTRAMUSCULAR | Status: DC | PRN
  Administered 2016-03-21: 10 mL

## 2016-03-21 MED ORDER — ENOXAPARIN SODIUM 40 MG/0.4ML ~~LOC~~ SOLN
40.0000 mg | SUBCUTANEOUS | Status: DC
  Administered 2016-03-22 – 2016-03-23 (×2): 40 mg via SUBCUTANEOUS
  Filled 2016-03-21 (×3): qty 0.4

## 2016-03-21 MED ORDER — POTASSIUM CHLORIDE 10 MEQ/100ML IV SOLN
INTRAVENOUS | Status: AC
Start: 2016-03-21 — End: 2016-03-21
  Filled 2016-03-21: qty 100

## 2016-03-21 MED ORDER — SUCCINYLCHOLINE 20MG/ML (10ML) SYRINGE FOR MEDFUSION PUMP - OPTIME
INTRAMUSCULAR | Status: DC | PRN
  Administered 2016-03-21: 120 mg via INTRAVENOUS

## 2016-03-21 MED ORDER — FENTANYL CITRATE (PF) 250 MCG/5ML IJ SOLN
INTRAMUSCULAR | Status: AC
  Filled 2016-03-21: qty 5

## 2016-03-21 MED ORDER — CHLORHEXIDINE GLUCONATE CLOTH 2 % EX PADS: 6.0000 | MEDICATED_PAD | Freq: Once | CUTANEOUS | Status: DC

## 2016-03-21 MED ORDER — LIDOCAINE HCL (CARDIAC) 10 MG/ML IV SOLN
INTRAVENOUS | Status: DC | PRN
  Administered 2016-03-21: 40 mg via INTRAVENOUS

## 2016-03-21 MED ORDER — OXYCODONE-ACETAMINOPHEN 5-325 MG PO TABS: 1.0000 | ORAL_TABLET | ORAL | Status: DC | PRN

## 2016-03-21 MED ORDER — IRBESARTAN 150 MG PO TABS
150.0000 mg | ORAL_TABLET | Freq: Every day | ORAL | Status: DC
  Administered 2016-03-22 – 2016-03-24 (×3): 150 mg via ORAL
  Filled 2016-03-21 (×3): qty 1

## 2016-03-21 MED ORDER — ONDANSETRON HCL 4 MG/2ML IJ SOLN
4.0000 mg | Freq: Once | INTRAMUSCULAR | Status: AC
  Administered 2016-03-21: 4 mg via INTRAVENOUS

## 2016-03-21 MED ORDER — HYDROCHLOROTHIAZIDE 12.5 MG PO CAPS
12.5000 mg | ORAL_CAPSULE | Freq: Every day | ORAL | Status: DC
  Administered 2016-03-22 – 2016-03-24 (×3): 12.5 mg via ORAL
  Filled 2016-03-21 (×3): qty 1

## 2016-03-21 MED ORDER — FENTANYL CITRATE (PF) 100 MCG/2ML IJ SOLN
INTRAMUSCULAR | Status: DC | PRN
  Administered 2016-03-21 (×4): 50 ug via INTRAVENOUS
  Administered 2016-03-21: 100 ug via INTRAVENOUS
  Administered 2016-03-21 (×5): 50 ug via INTRAVENOUS

## 2016-03-21 MED ORDER — LACTATED RINGERS IV SOLN
INTRAVENOUS | Status: DC
  Administered 2016-03-21 (×4): via INTRAVENOUS

## 2016-03-21 SURGICAL SUPPLY — 55 items
APL SRG 38 LTWT LNG FL B (MISCELLANEOUS) ×1
APPLICATOR ARISTA FLEXITIP XL (MISCELLANEOUS) ×1 IMPLANT
APPLIER CLIP LAPSCP 10X32 DD (CLIP) ×2 IMPLANT
BAG HAMPER (MISCELLANEOUS) ×2 IMPLANT
BAG SPEC RTRVL LRG 6X4 10 (ENDOMECHANICALS) ×2
CATH ROBINSON RED A/P 16FR (CATHETERS) ×1 IMPLANT
CHLORAPREP W/TINT 26ML (MISCELLANEOUS) ×2 IMPLANT
CLOTH BEACON ORANGE TIMEOUT ST (SAFETY) ×2 IMPLANT
COVER LIGHT HANDLE STERIS (MISCELLANEOUS) ×4 IMPLANT
CUTTER FLEX LINEAR 45M (STAPLE) ×1 IMPLANT
DECANTER SPIKE VIAL GLASS SM (MISCELLANEOUS) ×2 IMPLANT
ELECT REM PT RETURN 9FT ADLT (ELECTROSURGICAL) ×2
ELECTRODE REM PT RTRN 9FT ADLT (ELECTROSURGICAL) ×1 IMPLANT
EVACUATOR DRAINAGE 10X20 100CC (DRAIN) IMPLANT
EVACUATOR SILICONE 100CC (DRAIN) ×2
FILTER SMOKE EVAC LAPAROSHD (FILTER) ×2 IMPLANT
FORMALIN 10 PREFIL 120ML (MISCELLANEOUS) ×2 IMPLANT
GAUZE SPONGE 4X4 12PLY STRL (GAUZE/BANDAGES/DRESSINGS) ×1 IMPLANT
GLOVE BIOGEL PI IND STRL 7.0 (GLOVE) ×1 IMPLANT
GLOVE BIOGEL PI INDICATOR 7.0 (GLOVE) ×3
GLOVE ECLIPSE 6.5 STRL STRAW (GLOVE) ×2 IMPLANT
GLOVE SURG SS PI 7.5 STRL IVOR (GLOVE) ×2 IMPLANT
GOWN STRL REUS W/ TWL XL LVL3 (GOWN DISPOSABLE) ×1 IMPLANT
GOWN STRL REUS W/TWL LRG LVL3 (GOWN DISPOSABLE) ×4 IMPLANT
GOWN STRL REUS W/TWL XL LVL3 (GOWN DISPOSABLE) ×2
HEMOSTAT ARISTA ABSORB 3G PWDR (MISCELLANEOUS) ×1 IMPLANT
HEMOSTAT SNOW SURGICEL 2X4 (HEMOSTASIS) ×3 IMPLANT
INST SET LAPROSCOPIC AP (KITS) ×2 IMPLANT
IV NS IRRIG 3000ML ARTHROMATIC (IV SOLUTION) ×2 IMPLANT
KIT ROOM TURNOVER APOR (KITS) ×2 IMPLANT
MANIFOLD NEPTUNE II (INSTRUMENTS) ×2 IMPLANT
NDL INSUFFLATION 14GA 120MM (NEEDLE) ×1 IMPLANT
NEEDLE INSUFFLATION 14GA 120MM (NEEDLE) ×2 IMPLANT
NS IRRIG 1000ML POUR BTL (IV SOLUTION) ×2 IMPLANT
PACK LAP CHOLE LZT030E (CUSTOM PROCEDURE TRAY) ×2 IMPLANT
PAD ARMBOARD 7.5X6 YLW CONV (MISCELLANEOUS) ×2 IMPLANT
PENCIL HANDSWITCHING (ELECTRODE) ×1 IMPLANT
POUCH SPECIMEN RETRIEVAL 10MM (ENDOMECHANICALS) ×3 IMPLANT
RELOAD STAPLE 45 3.5 BLU ETS (ENDOMECHANICALS) IMPLANT
RELOAD STAPLE TA45 3.5 REG BLU (ENDOMECHANICALS) ×4 IMPLANT
SET BASIN LINEN APH (SET/KITS/TRAYS/PACK) ×2 IMPLANT
SET TUBE IRRIG SUCTION NO TIP (IRRIGATION / IRRIGATOR) ×1 IMPLANT
SLEEVE ENDOPATH XCEL 5M (ENDOMECHANICALS) ×2 IMPLANT
SPONGE DRAIN TRACH 4X4 STRL 2S (GAUZE/BANDAGES/DRESSINGS) ×1 IMPLANT
SPONGE LAP 18X18 X RAY DECT (DISPOSABLE) ×1 IMPLANT
STAPLER VISISTAT (STAPLE) ×2 IMPLANT
SUT ETHILON 3 0 FSL (SUTURE) ×1 IMPLANT
SUT VICRYL 0 UR6 27IN ABS (SUTURE) ×2 IMPLANT
TROCAR ENDO BLADELESS 11MM (ENDOMECHANICALS) ×2 IMPLANT
TROCAR XCEL NON-BLD 5MMX100MML (ENDOMECHANICALS) ×2 IMPLANT
TROCAR XCEL UNIV SLVE 11M 100M (ENDOMECHANICALS) ×2 IMPLANT
TUBE CONNECTING 12X1/4 (SUCTIONS) ×2 IMPLANT
TUBING INSUFFLATION (TUBING) ×2 IMPLANT
WARMER LAPAROSCOPE (MISCELLANEOUS) ×2 IMPLANT
YANKAUER SUCT BULB TIP 10FT TU (MISCELLANEOUS) ×1 IMPLANT

## 2016-03-21 NOTE — Progress Notes (Signed)
Inpatient Diabetes Program Recommendations  AACE/ADA: New Consensus Statement on Inpatient Glycemic Control (2015)  Target Ranges:  Prepandial:   less than 140 mg/dL      Peak postprandial:   less than 180 mg/dL (1-2 hours)      Critically ill patients:  140 - 180 mg/dL   Results for Nance PewDICKERSON, Dulcey M (MRN [id number]) as of 03/21/2016 08:16  Ref. Range 03/20/2016 22:57 03/21/2016 04:27 03/21/2016 07:37  Glucose-Capillary Latest Ref Range: 65 - 99 mg/dL 409200 (H)  Novolog 2 units 232 (H)  Novolog 3 units 217 (H)   Review of Glycemic Control  Diabetes history: DM2 Outpatient Diabetes medications: Metformin 500 mg BID Current orders for Inpatient glycemic control: Novolog 0-9 units Q4H  Inpatient Diabetes Program Recommendations: Insulin - Basal: Please consider ordering low dose basal insulin. Recommend starting with Lantus 10 units Q24H. HgbA1C: Please add an A1C on to blood in lab to evaluate glycemic control over the past 2-3 months.  Thanks, Orlando PennerMarie Mailynn Everly, RN, MSN, CDE Diabetes Coordinator Inpatient Diabetes Program [phone] (Team Pager from 8am to 5pm) [phone] ([facility] office) [phone] [facility] office) [phone] [facility] office)

## 2016-03-21 NOTE — Care Management Note (Signed)
Case Management Note  Patient Details  Name: Nance PewBarbara M Streeper MRN: [id number] Date of Birth: 03-Feb-1966  Pt reviewed for needs. She is admitted for cholecystitis and is undergoing lap chole today. Pt is from home, lives alone and is ind with ADL's. Pt has PCP, Oval Linseyichard Dondiego, and insurance with drug coverage. Anticipate pt will return home with self care in next 24 hrs. No CM needs anticipated.   Expected Discharge Date:    03/22/2016              Expected Discharge Plan:  Home/Self Care  In-House Referral:  NA  Discharge planning Services  CM Consult  Post Acute Care Choice:  NA Choice offered to:  NA  DME Arranged:    DME Agency:     HH Arranged:    HH Agency:     Status of Service:  Completed, signed off  If discussed at [employer] Length of Stay Meetings, dates discussed:    Additional Comments:  Malcolm MetroChildress, Paetyn Pietrzak Demske, RN 03/21/2016, 2:54 PM

## 2016-03-21 NOTE — Transfer of Care (Signed)
Immediate Anesthesia Transfer of Care Note  Patient: Terri PewBarbara M Wubben  Procedure(s) Performed: Procedure(s): LAPAROSCOPIC CHOLECYSTECTOMY (N/A)  Patient Location: PACU  Anesthesia Type:General  Level of Consciousness: awake and alert   Airway & Oxygen Therapy: Patient Spontanous Breathing and Patient connected to face mask oxygen  Post-op Assessment: Report given to RN  Post vital signs: Reviewed and stable  Last Vitals:  Vitals:   03/21/16 1220 03/21/16 1225  BP: 131/74 (!) 141/75  Pulse:    Resp: (!) 21 (!) 21  Temp:      Last Pain:  Vitals:   03/21/16 1210  TempSrc:   PainSc: Asleep      Patients Stated Pain Goal: 2 (03/21/16 1030)  Complications: No apparent anesthesia complications

## 2016-03-21 NOTE — Progress Notes (Signed)
Pt back from OR. VSS. Incision intact. Lesly Dukesachel J Everett, RN

## 2016-03-21 NOTE — Consult Note (Signed)
Reason for Consult: Cholecystitis, cholelithiasis Referring Physician: Dr. Lorriane Shire  Terri [name] is an 50 y.o. female.  HPI: Patient is a 50 year old black female who presented emergency room with worsening right upper quadrant abdominal pain, nausea, vomiting after having a fatty meal. This has been going on for several days. This was her first episode. She denies any fever, chills, or jaundice. The pain was persistent which required emergency room visits and IV pain medications. Ultrasound the gallbladder reveals cholecystitis with cholelithiasis. Common bile duct show no evidence of choledocholithiasis. Her white blood cell count was noted to be elevated, though she was admitted to the hospital for further evaluation and treatment.  Past Medical History:  Diagnosis Date  . Diabetes mellitus without complication ([place])   . Hyperlipidemia   . Hypertension     Past Surgical History:  Procedure Laterality Date  . ABDOMINAL HYSTERECTOMY    . ANKLE FUSION Right     No family history on file.  Social History:  reports that she has never smoked. She has never used smokeless tobacco. She reports that she does not drink alcohol or use drugs.  Allergies: No Known Allergies  Medications:  Scheduled: . sodium chloride   Intravenous STAT  . Chlorhexidine Gluconate Cloth  6 each Topical Once  . enoxaparin (LOVENOX) injection  40 mg Subcutaneous Once  . insulin aspart  0-9 Units Subcutaneous Q4H  . piperacillin-tazobactam (ZOSYN)  IV  3.375 g Intravenous Q8H  . potassium chloride       Continuous: . 0.9 % NaCl with KCl 20 mEq / L      Results for orders placed or performed during the hospital encounter of 03/20/16 (from the past 48 hour(s))  Comprehensive metabolic panel     Status: Abnormal   Collection Time: 03/20/16  6:40 PM  Result Value Ref Range   Sodium 130 (L) 135 - 145 mmol/L   Potassium 3.2 (L) 3.5 - 5.1 mmol/L   Chloride 96 (L) 101 - 111 mmol/L   CO2 25 22 - 32  mmol/L   Glucose, Bld 218 (H) 65 - 99 mg/dL   BUN 9 6 - 20 mg/dL   Creatinine, Ser 0.60 0.44 - 1.00 mg/dL   Calcium 8.6 (L) 8.9 - 10.3 mg/dL   Total Protein 8.0 6.5 - 8.1 g/dL   Albumin 3.9 3.5 - 5.0 g/dL   AST 17 15 - 41 U/L   ALT 19 14 - 54 U/L   Alkaline Phosphatase 48 38 - 126 U/L   Total Bilirubin 0.9 0.3 - 1.2 mg/dL   GFR calc non Af Amer >60 >60 mL/min   GFR calc Af Amer >60 >60 mL/min    Comment: (NOTE) The eGFR has been calculated using the CKD EPI equation. This calculation has not been validated in all clinical situations. eGFR's persistently <60 mL/min signify possible Chronic Kidney Disease.    Anion gap 9 5 - 15  Lipase, blood     Status: None   Collection Time: 03/20/16  6:40 PM  Result Value Ref Range   Lipase 17 11 - 51 U/L  CBC with Differential     Status: Abnormal   Collection Time: 03/20/16  6:40 PM  Result Value Ref Range   WBC 15.3 (H) 4.0 - 10.5 K/uL   RBC 3.93 3.87 - 5.11 MIL/uL   Hemoglobin 11.9 (L) 12.0 - 15.0 g/dL   HCT 36.4 36.0 - 46.0 %   MCV 92.6 78.0 - 100.0 fL   MCH  30.3 26.0 - 34.0 pg   MCHC 32.7 30.0 - 36.0 g/dL   RDW 23.7 16.2 - 17.1 %   Platelets 198 150 - 400 K/uL   Neutrophils Relative % 80 %   Neutro Abs 12.1 (H) 1.7 - 7.7 K/uL   Lymphocytes Relative 11 %   Lymphs Abs 1.7 0.7 - 4.0 K/uL   Monocytes Relative 9 %   Monocytes Absolute 1.4 (H) 0.1 - 1.0 K/uL   Eosinophils Relative 0 %   Eosinophils Absolute 0.0 0.0 - 0.7 K/uL   Basophils Relative 0 %   Basophils Absolute 0.0 0.0 - 0.1 K/uL  Glucose, capillary     Status: Abnormal   Collection Time: 03/20/16 10:57 PM  Result Value Ref Range   Glucose-Capillary 200 (H) 65 - 99 mg/dL   Comment 1 Notify RN    Comment 2 Document in Chart   Glucose, capillary     Status: Abnormal   Collection Time: 03/21/16  4:27 AM  Result Value Ref Range   Glucose-Capillary 232 (H) 65 - 99 mg/dL   Comment 1 Notify RN    Comment 2 Document in Chart   Basic metabolic panel     Status: Abnormal    Collection Time: 03/21/16  5:49 AM  Result Value Ref Range   Sodium 135 135 - 145 mmol/L   Potassium 3.5 3.5 - 5.1 mmol/L   Chloride 96 (L) 101 - 111 mmol/L   CO2 27 22 - 32 mmol/L   Glucose, Bld 226 (H) 65 - 99 mg/dL   BUN 9 6 - 20 mg/dL   Creatinine, Ser 2.68 0.44 - 1.00 mg/dL   Calcium 8.8 (L) 8.9 - 10.3 mg/dL   GFR calc non Af Amer >60 >60 mL/min   GFR calc Af Amer >60 >60 mL/min    Comment: (NOTE) The eGFR has been calculated using the CKD EPI equation. This calculation has not been validated in all clinical situations. eGFR's persistently <60 mL/min signify possible Chronic Kidney Disease.    Anion gap 12 5 - 15  CBC     Status: Abnormal   Collection Time: 03/21/16  5:49 AM  Result Value Ref Range   WBC 15.1 (H) 4.0 - 10.5 K/uL   RBC 3.72 (L) 3.87 - 5.11 MIL/uL   Hemoglobin 11.3 (L) 12.0 - 15.0 g/dL   HCT 08.2 (L) 26.3 - 26.4 %   MCV 93.3 78.0 - 100.0 fL   MCH 30.4 26.0 - 34.0 pg   MCHC 32.6 30.0 - 36.0 g/dL   RDW 76.4 15.9 - 76.8 %   Platelets 198 150 - 400 K/uL  Glucose, capillary     Status: Abnormal   Collection Time: 03/21/16  7:37 AM  Result Value Ref Range   Glucose-Capillary 217 (H) 65 - 99 mg/dL    Dg Chest 2 View  Result Date: 03/20/2016 CLINICAL DATA:  Right thoracic pain.  Heartburn. EXAM: CHEST  2 VIEW COMPARISON:  None. FINDINGS: The heart size and mediastinal contours are within normal limits. Both lungs are clear. The visualized skeletal structures are unremarkable. IMPRESSION: No active cardiopulmonary disease. Electronically Signed   By: Ted Mcalpine M.D.   On: 03/20/2016 19:11   US Abdomen Limited  Result Date: 03/20/2016 CLINICAL DATA:  Nausea for 4 days. EXAM: US ABDOMEN LIMITED - RIGHT UPPER QUADRANT COMPARISON:  None. FINDINGS: Gallbladder: Multiple small stones are identified in the gallbladder measuring up to 0.6 cm. There is pericholecystic fluid and gallbladder wall thickening. Sonographer reports  negative Murphy's sign. There may  also be a 0.8 cm gallbladder polyp. Common bile duct: Diameter: 0.7 cm Liver: The liver demonstrates increased echogenicity without focal lesion. IMPRESSION: Although sonographer of reports negative Murphy's sign, stones, wall thickening and pericholecystic fluid are compatible with acute cholecystitis. Fatty infiltration of the liver. The common bile duct is at the upper limits of normal measuring 0.7 cm but no stone is seen within the duct. Electronically Signed   By: Inge Rise M.D.   On: 03/20/2016 18:35    ROS:  Pertinent items noted in HPI and remainder of comprehensive ROS otherwise negative.  Blood pressure (!) 154/85, pulse 97, temperature 100.3 F (37.9 C), temperature source Oral, resp. rate 20, height _0  (1.676 m), weight 117.9 kg (260 lb), SpO2 95 %. Physical Exam: Pleasant black female in no acute distress. Head is normocephalic, atraumatic. Neck is supple without lymphadenopathy or carotid bruits. Lungs clear to auscultation with equal breath sounds bilaterally. Heart examination reveals regular rate and rhythm without S3, S4, murmurs. The abdomen is soft with tenderness noted in the right upper quadrant to deep palpation. No hepatosplenomegaly, masses appreciated, though examination is limited secondary to body habitus. No rigidity is noted.  Assessment/Plan: Impression: Acute cholecystitis, cholelithiasis Plan: Patient be taken to the operating room today for laparoscopic cholecystectomy. The risks and benefits of the procedure including bleeding, infection, hepatobiliary injury, the possibility of an open procedure were fully explained to the patient, who gave informed consent.  Chistine Dematteo A 03/21/2016, 10:11 AM

## 2016-03-21 NOTE — Progress Notes (Signed)
Appreciated admission notes and labs anesthesiology evaluation as well as surgical consultation. Patient is currently down undergoing cholecystectomy laparoscopically Terri [name]:[id number] DOB: 1966/02/11 DOA: 03/20/2016 PCP: Isabella StallingNDIEGO,Kyri Dai M, MD   Physical Exam: Blood pressure (!) 141/75, pulse 97, temperature 99.5 F (37.5 C), temperature source Oral, resp. rate (!) 21, height 5\' 6"  (1.676 [name]), weight 117.9 kg (260 lb), SpO2 95 %. Patient currently in operating room as I dictate this chart   Investigations:  No results found for this or any previous visit (from the past 240 hour(s)).   Basic Metabolic Panel:  Recent Labs  81/19/1407/05/28 1840 03/21/16 0549  NA 130* 135  K 3.2* 3.5  CL 96* 96*  CO2 25 27  GLUCOSE 218* 226*  BUN 9 9  CREATININE 0.60 0.67  CALCIUM 8.6* 8.8*   Liver Function Tests:  Recent Labs  03/20/16 1840  AST 17  ALT 19  ALKPHOS 48  BILITOT 0.9  PROT 8.0  ALBUMIN 3.9     CBC:  Recent Labs  03/20/16 1840 03/21/16 0549  WBC 15.3* 15.1*  NEUTROABS 12.1*  --   HGB 11.9* 11.3*  HCT 36.4 34.7*  MCV 92.6 93.3  PLT 198 198    Dg Chest 2 View  Result Date: 03/20/2016 CLINICAL DATA:  Right thoracic pain.  Heartburn. EXAM: CHEST  2 VIEW COMPARISON:  None. FINDINGS: The heart size and mediastinal contours are within normal limits. Both lungs are clear. The visualized skeletal structures are unremarkable. IMPRESSION: No active cardiopulmonary disease. Electronically Signed   By: Ted Mcalpineobrinka  Dimitrova [name].D.   On: 03/20/2016 19:11   [place] Abdomen Limited  Result Date: 03/20/2016 CLINICAL DATA:  Nausea for 4 days. EXAM: US ABDOMEN LIMITED - RIGHT UPPER QUADRANT COMPARISON:  None. FINDINGS: Gallbladder: Multiple small stones are identified in the gallbladder measuring up to 0.6 cm. There is pericholecystic fluid and gallbladder wall thickening. Sonographer reports negative Murphy's sign. There may also be a 0.8 cm gallbladder polyp. Common bile duct:  Diameter: 0.7 cm Liver: The liver demonstrates increased echogenicity without focal lesion. IMPRESSION: Although sonographer of reports negative Murphy's sign, stones, wall thickening and pericholecystic fluid are compatible with acute cholecystitis. Fatty infiltration of the liver. The common bile duct is at the upper limits of normal measuring 0.7 cm but no stone is seen within the duct. Electronically Signed   By: Drusilla Kannerhomas  Dalessio [name].D.   On: 03/20/2016 18:35      Medications:  Impression:  Principal Problem:   Cholecystitis Active Problems:   Essential hypertension   DM (diabetes mellitus) (HCC)     Plan: We will allow surgery to order postoperative analgesia will monitor sugars and place on sliding scale for glycemic control and monitor hemodynamics  Consultants: Surgery anesthesiology   Procedures   Antibiotics:           Time spent: 20 minutes   LOS: 0 days   Terri [name]   03/21/2016, 1:30 PM

## 2016-03-21 NOTE — Anesthesia Preprocedure Evaluation (Signed)
Anesthesia Evaluation  Patient identified by MRN, date of birth, ID band Patient awake    Reviewed: Allergy & Precautions, NPO status , Patient's Chart, lab work & pertinent test results  Airway Mallampati: II  TM Distance: >3 FB Neck ROM: Full    Dental  (+) Teeth Intact   Pulmonary neg pulmonary ROS,    breath sounds clear to auscultation       Cardiovascular hypertension, Pt. on medications  Rhythm:Regular Rate:Normal     Neuro/Psych    GI/Hepatic GERD  Controlled and Medicated,  Endo/Other  diabetes, Well Controlled, Type 2, Oral Hypoglycemic AgentsMorbid obesity  Renal/GU      Musculoskeletal   Abdominal   Peds  Hematology   Anesthesia Other Findings   Reproductive/Obstetrics                             Anesthesia Physical Anesthesia Plan  ASA: II  Anesthesia Plan: General   Post-op Pain Management:    Induction: Intravenous, Rapid sequence and Cricoid pressure planned  Airway Management Planned: Oral ETT  Additional Equipment:   Intra-op Plan:   Post-operative Plan: Extubation in OR  Informed Consent: I have reviewed the patients History and Physical, chart, labs and discussed the procedure including the risks, benefits and alternatives for the proposed anesthesia with the patient or authorized representative who has indicated his/her understanding and acceptance.     Plan Discussed with:   Anesthesia Plan Comments:         Anesthesia Quick Evaluation

## 2016-03-21 NOTE — Anesthesia Postprocedure Evaluation (Signed)
Anesthesia Post Note  Patient: Terri [name]  Procedure(s) Performed: Procedure(s) (LRB): LAPAROSCOPIC CHOLECYSTECTOMY (N/A)  Patient location during evaluation: PACU Anesthesia Type: General Level of consciousness: awake and alert and oriented Pain management: pain level controlled Vital Signs Assessment: post-procedure vital signs reviewed and stable Respiratory status: spontaneous breathing Cardiovascular status: stable Postop Assessment: no signs of nausea or vomiting Anesthetic complications: no Comments: [facility] called to Dr. Jayme CloudGonzalez.    Last Vitals:  Vitals:   03/21/16 1630 03/21/16 1645  BP:    Pulse: (!) 104   Resp:  (!) 26  Temp:      Last Pain:  Vitals:   03/21/16 1645  TempSrc:   PainSc: 0-No pain                 Keyonia Gluth

## 2016-03-21 NOTE — Op Note (Signed)
Patient:  Terri PewBarbara M Levee  DOB:  07/06/1966  MRN:  [id number]   Preop Diagnosis:  Acute cholecystitis, cholelithiasis  Postop Diagnosis:  Same, empyema of gallbladder  Procedure:  Laparoscopic cholecystectomy  Surgeon:  Franky MachoMark Shaquisha Wynn, M.D.  Anes:  Gen. endotracheal  Indications:  Patient is a 50 year old black female who presents with acute cholecystitis secondary to cholelithiasis. The risks and benefits of the procedure including bleeding, infection, hepatobiliary injury, and the possibility of an open procedure were fully explained to the patient, who gave informed consent.  Procedure note:  The patient was placed the supine position. After induction of general endotracheal anesthesia, the abdomen was prepped and draped using the usual sterile technique with DuraPrep. Surgical site confirmation was performed.  A supraumbilical incision was made down to the fascia. A Veress needle was introduced into the abdominal cavity and confirmation of placement was done using the saline drop test. The abdomen was then insufflated to 16 mmHg pressure. An 11 mm trocar was introduced into the abdominal cavity under direct visualization without difficulty. The patient was placed in reverse Trendelenburg position and additional 11 mm trocar was placed the epigastric region 5 mm trochars were placed the right upper quadrant and right flank regions. The liver was inspected and noted within normal limits. The gallbladder was significantly distended, inflamed, with evidence of gangrenous changes along the wall. An incision was made into the fundus of the gallbladder and an empyema was found. This was evacuated to help facilitate mobilization of the gallbladder. The gallbladder was then retracted in a dynamic fashion in order to provide a critical view of the triangle low. This was cleared anteriorly. In addition, a dome down approach was used in a retrograde fashion in order to divide full exposure of the  infundibulum. There was a significant amount of inflammation present. The juncture of the infundibulum and the cystic duct was identified. Care was taken to avoid the common bile duct. A standard Endo GIA was placed across the juncture of the infundibulum and the cystic duct and fired. The gallbladder was then freed away from its remaining attachments using Bovie electrocautery. There was spillage of multiple stones, which were gathered using the large suction equipment. The gallbladder fossa was inspected no abnormal bleeding or bile leakage was noted.  Arista and Surgicel were placed in the gallbladder fossa. A #10 flat Jackson-Pratt drain was placed in the subhepatic space and brought through the 5 mm lateral trocar site. It was secured to skin level using 3-0 nylon interrupted suture. The right upper quadrant was copiously irrigated with normal saline.  The gallbladder was removed through the epigastric trocar site using an Endo Catch bag. All fluid and air were then evacuated from the abdominal cavity prior to removal of the trochars.  All wounds were irrigated with normal saline. All wounds were injected with 0.5% Sensorcaine. The epigastric fascia as well as supraumbilical fascia were reapproximated using 0 Vicryl interrupted sutures. All skin incisions were closed using staples. Betadine ointment and dry sterile dressings were applied.  All tape and needle counts were correct at the end of the procedure. The patient was extubated in the operating room and transferred to PACU in stable condition.  Complications:  None  EBL:  150 mL  Specimen:  Gallbladder  Drains: Jackson-Pratt drain to subhepatic space

## 2016-03-21 NOTE — Anesthesia Procedure Notes (Signed)
Procedure Name: Intubation Date/Time: 03/21/2016 12:49 PM Performed by: Glynn OctaveANIEL, Lamona Eimer E Pre-anesthesia Checklist: Patient identified, Patient being monitored, Timeout performed, Emergency Drugs available and Suction available Patient Re-evaluated:Patient Re-evaluated prior to inductionOxygen Delivery Method: Circle system utilized Preoxygenation: Pre-oxygenation with 100% oxygen Intubation Type: IV induction, Rapid sequence and Cricoid Pressure applied Ventilation: Mask ventilation without difficulty Laryngoscope Size: Mac and 3 Grade View: Grade I Tube type: Oral Tube size: 7.0 mm Number of attempts: 1 Airway Equipment and Method: Stylet Placement Confirmation: ETT inserted through vocal cords under direct vision,  positive ETCO2 and breath sounds checked- equal and bilateral Secured at: 22 cm Tube secured with: Tape Dental Injury: Teeth and Oropharynx as per pre-operative assessment

## 2016-03-22 LAB — CBC
HEMATOCRIT: 30.7 % — AB (ref 36.0–46.0)
HEMOGLOBIN: 9.9 g/dL — AB (ref 12.0–15.0)
MCH: 30.3 pg (ref 26.0–34.0)
MCHC: 32.2 g/dL (ref 30.0–36.0)
MCV: 93.9 fL (ref 78.0–100.0)
Platelets: 214 10*3/uL (ref 150–400)
RBC: 3.27 MIL/uL — AB (ref 3.87–5.11)
RDW: 13.8 % (ref 11.5–15.5)
WBC: 13 10*3/uL — AB (ref 4.0–10.5)

## 2016-03-22 LAB — HEPATIC FUNCTION PANEL
ALT: 54 U/L (ref 14–54)
AST: 70 U/L — AB (ref 15–41)
Albumin: 3 g/dL — ABNORMAL LOW (ref 3.5–5.0)
Alkaline Phosphatase: 51 U/L (ref 38–126)
Bilirubin, Direct: 0.5 mg/dL (ref 0.1–0.5)
Indirect Bilirubin: 0.8 mg/dL (ref 0.3–0.9)
TOTAL PROTEIN: 6.7 g/dL (ref 6.5–8.1)
Total Bilirubin: 1.3 mg/dL — ABNORMAL HIGH (ref 0.3–1.2)

## 2016-03-22 LAB — GLUCOSE, CAPILLARY
GLUCOSE-CAPILLARY: 221 mg/dL — AB (ref 65–99)
GLUCOSE-CAPILLARY: 225 mg/dL — AB (ref 65–99)
GLUCOSE-CAPILLARY: 238 mg/dL — AB (ref 65–99)
GLUCOSE-CAPILLARY: 264 mg/dL — AB (ref 65–99)
Glucose-Capillary: 237 mg/dL — ABNORMAL HIGH (ref 65–99)
Glucose-Capillary: 239 mg/dL — ABNORMAL HIGH (ref 65–99)

## 2016-03-22 LAB — BASIC METABOLIC PANEL
ANION GAP: 9 (ref 5–15)
BUN: 11 mg/dL (ref 6–20)
CHLORIDE: 98 mmol/L — AB (ref 101–111)
CO2: 25 mmol/L (ref 22–32)
Calcium: 7.8 mg/dL — ABNORMAL LOW (ref 8.9–10.3)
Creatinine, Ser: 0.73 mg/dL (ref 0.44–1.00)
Glucose, Bld: 235 mg/dL — ABNORMAL HIGH (ref 65–99)
POTASSIUM: 3.5 mmol/L (ref 3.5–5.1)
SODIUM: 132 mmol/L — AB (ref 135–145)

## 2016-03-22 LAB — MAGNESIUM: MAGNESIUM: 1.8 mg/dL (ref 1.7–2.4)

## 2016-03-22 LAB — PHOSPHORUS: PHOSPHORUS: 2.3 mg/dL — AB (ref 2.5–4.6)

## 2016-03-22 MED ORDER — KCL IN DEXTROSE-NACL 20-5-0.45 MEQ/L-%-% IV SOLN
INTRAVENOUS | Status: DC
  Administered 2016-03-22: 12:00:00 via INTRAVENOUS

## 2016-03-22 MED ORDER — INSULIN ASPART 100 UNIT/ML ~~LOC~~ SOLN
0.0000 [IU] | Freq: Three times a day (TID) | SUBCUTANEOUS | Status: DC
  Administered 2016-03-22 (×2): 7 [IU] via SUBCUTANEOUS
  Administered 2016-03-23: 4 [IU] via SUBCUTANEOUS
  Administered 2016-03-23 (×2): 11 [IU] via SUBCUTANEOUS
  Administered 2016-03-24 (×2): 7 [IU] via SUBCUTANEOUS

## 2016-03-22 MED ORDER — METOPROLOL TARTRATE 25 MG PO TABS
25.0000 mg | ORAL_TABLET | Freq: Two times a day (BID) | ORAL | Status: DC
  Administered 2016-03-22 – 2016-03-24 (×5): 25 mg via ORAL
  Filled 2016-03-22 (×5): qty 1

## 2016-03-22 NOTE — Progress Notes (Signed)
Status post cholecystectomy postop day 2 patient was not insulin dependent prior to admission currently is requiring significant insulin for glycemic control will have to monitor this over the next 24 hours and calculate discharge dosage alert and oriented tolerating diet Terri [name] WUJ:[id number] DOB: 1966/02/26 DOA: 03/20/2016 PCP: Terri StallingNDIEGO,Terri [name], Terri [name]   Physical Exam: Blood pressure 134/69, pulse (!) 118, temperature 100 F (37.8 C), temperature source Oral, resp. rate 20, height 5\' 6"  (1.676 [name]), weight 117.9 kg (260 lb), SpO2 93 %. Lungs clear to A&P no rales wheeze rhonchi heart regular rhythm no murmurs goes rubs abdomen soft right upper quadrant discomfort bowel sounds normoactive   Investigations:  No results found for this or any previous visit (from the past 240 hour(s)).   Basic Metabolic Panel:  Recent Labs  95/62/1307/06/28 0549 03/22/16 0552  NA 135 132*  K 3.5 3.5  CL 96* 98*  CO2 27 25  GLUCOSE 226* 235*  BUN 9 11  CREATININE 0.67 0.73  CALCIUM 8.8* 7.8*  MG  --  1.8  PHOS  --  2.3*   Liver Function Tests:  Recent Labs  03/20/16 1840 03/22/16 0552  AST 17 70*  ALT 19 54  ALKPHOS 48 51  BILITOT 0.9 1.3*  PROT 8.0 6.7  ALBUMIN 3.9 3.0*     CBC:  Recent Labs  03/20/16 1840 03/21/16 0549 03/21/16 1624 03/22/16 0552  WBC 15.3* 15.1*  --  13.0*  NEUTROABS 12.1*  --   --   --   HGB 11.9* 11.3* 10.7* 9.9*  HCT 36.4 34.7* 34.3* 30.7*  MCV 92.6 93.3  --  93.9  PLT 198 198  --  214    Dg Chest 2 View  Result Date: 03/20/2016 CLINICAL DATA:  Right thoracic pain.  Heartburn. EXAM: CHEST  2 VIEW COMPARISON:  None. FINDINGS: The heart size and mediastinal contours are within normal limits. Both lungs are clear. The visualized skeletal structures are unremarkable. IMPRESSION: No active cardiopulmonary disease. Electronically Signed   By: Ted Mcalpineobrinka  Dimitrova [name].D.   On: 03/20/2016 19:11   [place] Abdomen Limited  Result Date: 03/20/2016 CLINICAL DATA:   Nausea for 4 days. EXAM: US ABDOMEN LIMITED - RIGHT UPPER QUADRANT COMPARISON:  None. FINDINGS: Gallbladder: Multiple small stones are identified in the gallbladder measuring up to 0.6 cm. There is pericholecystic fluid and gallbladder wall thickening. Sonographer reports negative Murphy's sign. There may also be a 0.8 cm gallbladder polyp. Common bile duct: Diameter: 0.7 cm Liver: The liver demonstrates increased echogenicity without focal lesion. IMPRESSION: Although sonographer of reports negative Murphy's sign, stones, wall thickening and pericholecystic fluid are compatible with acute cholecystitis. Fatty infiltration of the liver. The common bile duct is at the upper limits of normal measuring 0.7 cm but no stone is seen within the duct. Electronically Signed   By: Drusilla Kannerhomas  Dalessio [name].D.   On: 03/20/2016 18:35      Medications:   Impression:  Principal Problem:   Cholecystitis Active Problems:   Essential hypertension   DM (diabetes mellitus) (HCC)   Acute cholecystitis     Plan: Calculate total insulin requirements and prescribed insulin as an outpatient out of bed to chair 3 times a day  Consultants: Surgery   Procedures   Antibiotics:        Time spent: 30 minutes   LOS: 1 day   Terri [name]   03/22/2016, 11:33 AM

## 2016-03-22 NOTE — Progress Notes (Signed)
Pt's HR elevated at 120'-130's. Asymptomatic. Temp 100.2. Gave tylenol and MD notified regarding HR. MD gave verbal order for Lopressor 25mg  PO BID. RN will continue to monitor. Lesly Dukesachel J Everett, RN

## 2016-03-22 NOTE — Progress Notes (Signed)
Foley D/C. Pt educated about voiding and fluid intake. RN will continue to monitor. Lesly Dukesachel J Everett, RN

## 2016-03-22 NOTE — Anesthesia Postprocedure Evaluation (Addendum)
Anesthesia Post Note  Patient: Terri [name]  Procedure(s) Performed: Procedure(s) (LRB): LAPAROSCOPIC CHOLECYSTECTOMY (N/A)  Patient location during evaluation: Nursing Unit Anesthesia Type: General Level of consciousness: awake and alert and oriented Pain management: pain level controlled Vital Signs Assessment: post-procedure vital signs reviewed and stable Respiratory status: spontaneous breathing Cardiovascular status: stable Postop Assessment: no signs of nausea or vomiting Anesthetic complications: no Comments: Spoke with RN caring for [name].  They are addressing HR issues with pain meds and IS.  She stated she will be contacting hospitalist today to update him on progress.      Last Vitals:  Vitals:   03/22/16 0600 03/22/16 1353  BP: 134/69 132/71  Pulse: (!) 118 (!) 128  Resp: 20 20  Temp: 37.8 C 37.9 C    Last Pain:  Vitals:   03/22/16 1353  TempSrc: Oral  PainSc:                  Tyrik Stetzer

## 2016-03-22 NOTE — Addendum Note (Signed)
Addendum  created 03/22/16 1408 by Moshe SalisburyKaren E Derrall Hicks, CRNA   Sign clinical note

## 2016-03-22 NOTE — Progress Notes (Signed)
1 Day Post-Op  Subjective: Patient feels fatigued. Mild incisional pain that is well controlled.  Objective: Vital signs in last 24 hours: Temp:  [98.5 F (36.9 C)-100 F (37.8 C)] 100 F (37.8 C) (08/12 0600) Pulse Rate:  [104-126] 118 (08/12 0600) Resp:  [17-26] 20 (08/12 0600) BP: (122-161)/(69-108) 134/69 (08/12 0600) SpO2:  [93 %-100 %] 93 % (08/12 0600) Last BM Date: 03/20/16  Intake/Output from previous day: 08/11 0701 - 08/12 0700 In: 4080 [P.O.:480; I.V.:3500; IV Piggyback:100] Out: 1470 [Urine:1200; Drains:120; Blood:150] Intake/Output this shift: No intake/output data recorded.  General appearance: alert, appears stated age and no distress Head: Normocephalic, without obvious abnormality, atraumatic Eyes: No scleral icterus. Resp: clear to auscultation bilaterally Cardio: regular rate and rhythm, S1, S2 normal, no murmur, click, rub or gallop GI: Soft, dressings dry and intact. JP drainage serous in nature. No bile present.  Lab Results:   Recent Labs  03/21/16 0549 03/21/16 1624 03/22/16 0552  WBC 15.1*  --  13.0*  HGB 11.3* 10.7* 9.9*  HCT 34.7* 34.3* 30.7*  PLT 198  --  214   BMET  Recent Labs  03/21/16 0549 03/22/16 0552  NA 135 132*  K 3.5 3.5  CL 96* 98*  CO2 27 25  GLUCOSE 226* 235*  BUN 9 11  CREATININE 0.67 0.73  CALCIUM 8.8* 7.8*   PT/INR No results for input(s): LABPROT, INR in the last 72 hours.  Studies/Results: Dg Chest 2 View  Result Date: 03/20/2016 CLINICAL DATA:  Right thoracic pain.  Heartburn. EXAM: CHEST  2 VIEW COMPARISON:  None. FINDINGS: The heart size and mediastinal contours are within normal limits. Both lungs are clear. The visualized skeletal structures are unremarkable. IMPRESSION: No active cardiopulmonary disease. Electronically Signed   By: Ted Mcalpineobrinka  Dimitrova M.D.   On: 03/20/2016 19:11   [place] Abdomen Limited  Result Date: 03/20/2016 CLINICAL DATA:  Nausea for 4 days. EXAM: US ABDOMEN LIMITED - RIGHT UPPER  QUADRANT COMPARISON:  None. FINDINGS: Gallbladder: Multiple small stones are identified in the gallbladder measuring up to 0.6 cm. There is pericholecystic fluid and gallbladder wall thickening. Sonographer reports negative Murphy's sign. There may also be [name] 0.8 cm gallbladder polyp. Common bile duct: Diameter: 0.7 cm Liver: The liver demonstrates increased echogenicity without focal lesion. IMPRESSION: Although sonographer of reports negative Murphy's sign, stones, wall thickening and pericholecystic fluid are compatible with acute cholecystitis. Fatty infiltration of the liver. The common bile duct is at the upper limits of normal measuring 0.7 cm but no stone is seen within the duct. Electronically Signed   By: Drusilla Kannerhomas  Dalessio M.D.   On: 03/20/2016 18:35    Anti-infectives: Anti-infectives    Start     Dose/Rate Route Frequency Ordered Stop   03/21/16 0300  piperacillin-tazobactam (ZOSYN) IVPB 3.375 g     3.375 g 12.5 mL/hr over 240 Minutes Intravenous Every 8 hours 03/20/16 2301     03/20/16 1945  piperacillin-tazobactam (ZOSYN) IVPB 3.375 g     3.375 g 100 mL/hr over 30 Minutes Intravenous  Once 03/20/16 1944 03/20/16 2032      Assessment/Plan: s/p Procedure(s): LAPAROSCOPIC CHOLECYSTECTOMY Impression: Stable on postoperative day 1. Hemoglobin stable. Liver enzyme tests reviewed. Leukocytosis starting to resolve.  Lan: Will DC Foley. Advance diet as tolerated. Continue IV Zosyn.  LOS: 1 day    Terri [name] 03/22/2016

## 2016-03-22 NOTE — Progress Notes (Signed)
Pt ambulated in hall. 2L [place]. Tolerated well. HR 153. Asymptomatic. RN will continue to monitor. Lesly Dukesachel J Everett, RN

## 2016-03-22 NOTE — Addendum Note (Signed)
Addendum  created 03/22/16 1411 by Moshe SalisburyKaren E Tray Klayman, CRNA   Sign clinical note

## 2016-03-23 LAB — BASIC METABOLIC PANEL
ANION GAP: 10 (ref 5–15)
BUN: 11 mg/dL (ref 6–20)
CHLORIDE: 92 mmol/L — AB (ref 101–111)
CO2: 30 mmol/L (ref 22–32)
Calcium: 8 mg/dL — ABNORMAL LOW (ref 8.9–10.3)
Creatinine, Ser: 0.78 mg/dL (ref 0.44–1.00)
GFR calc Af Amer: >60 mL/min (ref >60)
GLUCOSE: 232 mg/dL — AB (ref 65–99)
POTASSIUM: 3.4 mmol/L — AB (ref 3.5–5.1)
Sodium: 132 mmol/L — ABNORMAL LOW (ref 135–145)

## 2016-03-23 LAB — CBC
HEMATOCRIT: 29.6 % — AB (ref 36.0–46.0)
HEMOGLOBIN: 9.5 g/dL — AB (ref 12.0–15.0)
MCH: 30.1 pg (ref 26.0–34.0)
MCHC: 32.1 g/dL (ref 30.0–36.0)
MCV: 93.7 fL (ref 78.0–100.0)
Platelets: 218 10*3/uL (ref 150–400)
RBC: 3.16 MIL/uL — ABNORMAL LOW (ref 3.87–5.11)
RDW: 13.8 % (ref 11.5–15.5)
WBC: 14.4 10*3/uL — AB (ref 4.0–10.5)

## 2016-03-23 LAB — GLUCOSE, CAPILLARY
GLUCOSE-CAPILLARY: 286 mg/dL — AB (ref 65–99)
GLUCOSE-CAPILLARY: 166 mg/dL — AB (ref 65–99)
GLUCOSE-CAPILLARY: 216 mg/dL — AB (ref 65–99)
Glucose-Capillary: 258 mg/dL — ABNORMAL HIGH (ref 65–99)

## 2016-03-23 LAB — HEPATIC FUNCTION PANEL
ALBUMIN: 2.9 g/dL — AB (ref 3.5–5.0)
ALK PHOS: 53 U/L (ref 38–126)
ALT: 36 U/L (ref 14–54)
AST: 22 U/L (ref 15–41)
BILIRUBIN INDIRECT: 0.5 mg/dL (ref 0.3–0.9)
Bilirubin, Direct: 0.3 mg/dL (ref 0.1–0.5)
TOTAL PROTEIN: 6.8 g/dL (ref 6.5–8.1)
Total Bilirubin: 0.8 mg/dL (ref 0.3–1.2)

## 2016-03-23 MED ORDER — METFORMIN HCL 500 MG PO TABS
500.0000 mg | ORAL_TABLET | Freq: Two times a day (BID) | ORAL | Status: DC
  Administered 2016-03-23 – 2016-03-24 (×3): 500 mg via ORAL
  Filled 2016-03-23 (×3): qty 1

## 2016-03-23 MED ORDER — MAGNESIUM HYDROXIDE 400 MG/5ML PO SUSP: 30.0000 mL | Freq: Three times a day (TID) | ORAL | Status: DC | PRN

## 2016-03-23 MED ORDER — KCL IN DEXTROSE-NACL 40-5-0.45 MEQ/L-%-% IV SOLN
INTRAVENOUS | Status: DC
  Administered 2016-03-23: 11:00:00 via INTRAVENOUS

## 2016-03-23 NOTE — Progress Notes (Signed)
2 Days Post-Op  Subjective: Patient feels much better. Mild incisional pain. Tolerating diet well. Is ambulating.  Objective: Vital signs in last 24 hours: Temp:  [98.5 F (36.9 C)-100.2 F (37.9 C)] 98.5 F (36.9 C) (08/13 0458) Pulse Rate:  [110-128] 110 (08/13 0458) Resp:  [20] 20 (08/13 0458) BP: (120-132)/(71-75) 120/75 (08/13 0458) SpO2:  [92 %-96 %] 92 % (08/13 0458) Last BM Date: 03/20/16  Intake/Output from previous day: 08/12 0701 - 08/13 0700 In: 1611.3 [I.V.:1361.3; IV Piggyback:250] Out: 1780 [Urine:1700; Drains:80] Intake/Output this shift: No intake/output data recorded.  General appearance: alert, cooperative and no distress GI: Soft, incisions healing well. JP drainage sanguinous in nature, decreasing. No bile noted.  Lab Results:   Recent Labs  03/22/16 0552 03/23/16 0555  WBC 13.0* 14.4*  HGB 9.9* 9.5*  HCT 30.7* 29.6*  PLT 214 218   BMET  Recent Labs  03/22/16 0552 03/23/16 0555  NA 132* 132*  K 3.5 3.4*  CL 98* 92*  CO2 25 30  GLUCOSE 235* 232*  BUN 11 11  CREATININE 0.73 0.78  CALCIUM 7.8* 8.0*   PT/INR No results for input(s): LABPROT, INR in the last 72 hours.  Studies/Results: No results found.  Anti-infectives: Anti-infectives    Start     Dose/Rate Route Frequency Ordered Stop   03/21/16 0300  piperacillin-tazobactam (ZOSYN) IVPB 3.375 g     3.375 g 12.5 mL/hr over 240 Minutes Intravenous Every 8 hours 03/20/16 2301     03/20/16 1945  piperacillin-tazobactam (ZOSYN) IVPB 3.375 g     3.375 g 100 mL/hr over 30 Minutes Intravenous  Once 03/20/16 1944 03/20/16 2032      Assessment/Plan: s/p Procedure(s): LAPAROSCOPIC CHOLECYSTECTOMY Impression: Stable on postoperative day 2. Mild sinus tachycardia is improving. She still does have a leukocytosis. Her blood sugars are still mildly elevated. Will add metformin. I have or increased her insulin coverage. Will add milk of magnesia. Adjust IV fluids. Anticipate discharge in  next 24-48 hours.  LOS: 2 days    Teodoro Jeffreys A 03/23/2016

## 2016-03-23 NOTE — Progress Notes (Signed)
Postop day 2 laparoscopic cholecystectomy she is still requiring insulin despite eating minimal of her regular diet she does have some nausea Zofran as ordered we'll calculate total long-acting insulin dosage to give her tomorrow assuming her dietary intake will improve and increase as an outpatient she was never insulin-dependent previously hemoglobin 9.5 expected blood loss potassium 3.4 will observe and repeat tomorrow Nance PewBarbara M Gracey OZH:[id number] DOB: October 25, 1965 DOA: 03/20/2016 PCP: Isabella StallingNDIEGO,Lavergne Hiltunen M, MD   Physical Exam: Blood pressure 120/75, pulse (!) 110, temperature 98.5 F (36.9 C), temperature source Oral, resp. rate 20, height 5\' 6"  (1.676 m), weight 117.9 kg (260 lb), SpO2 92 %. Lungs clear to A&P no rales wheeze rhonchi heart regular rhythm no murmurs gallops or rubs abdomen expected right upper quadrant discomfort bowel sounds normoactive   Investigations:  No results found for this or any previous visit (from the past 240 hour(s)).   Basic Metabolic Panel:  Recent Labs  62/95/2807/07/28 0552 03/23/16 0555  NA 132* 132*  K 3.5 3.4*  CL 98* 92*  CO2 25 30  GLUCOSE 235* 232*  BUN 11 11  CREATININE 0.73 0.78  CALCIUM 7.8* 8.0*  MG 1.8  --   PHOS 2.3*  --    Liver Function Tests:  Recent Labs  03/22/16 0552 03/23/16 0555  AST 70* 22  ALT 54 36  ALKPHOS 51 53  BILITOT 1.3* 0.8  PROT 6.7 6.8  ALBUMIN 3.0* 2.9*     CBC:  Recent Labs  03/20/16 1840  03/22/16 0552 03/23/16 0555  WBC 15.3*  < > 13.0* 14.4*  NEUTROABS 12.1*  --   --   --   HGB 11.9*  < > 9.9* 9.5*  HCT 36.4  < > 30.7* 29.6*  MCV 92.6  < > 93.9 93.7  PLT 198  < > 214 218  < > = values in this interval not displayed.  No results found.    Medications:   Impression:  Principal Problem:   Cholecystitis Active Problems:   Essential hypertension   DM (diabetes mellitus) (HCC)   Acute cholecystitis     Plan: Calculate Lantus dosage for discharge presumably tomorrow if okay with  surgery   Consultants: Surgery and anesthesiology   Procedures   Antibiotics: Zosyn          Time spent: 30 minutes   LOS: 2 days   Beatrice Ziehm M   03/23/2016, 12:50 PM

## 2016-03-24 LAB — CBC
HCT: 29.3 % — ABNORMAL LOW (ref 36.0–46.0)
HEMOGLOBIN: 9.4 g/dL — AB (ref 12.0–15.0)
MCH: 29.7 pg (ref 26.0–34.0)
MCHC: 32.1 g/dL (ref 30.0–36.0)
MCV: 92.4 fL (ref 78.0–100.0)
Platelets: 230 10*3/uL (ref 150–400)
RBC: 3.17 MIL/uL — AB (ref 3.87–5.11)
RDW: 13.9 % (ref 11.5–15.5)
WBC: 13.5 10*3/uL — ABNORMAL HIGH (ref 4.0–10.5)

## 2016-03-24 LAB — GLUCOSE, CAPILLARY
GLUCOSE-CAPILLARY: 241 mg/dL — AB (ref 65–99)
Glucose-Capillary: 247 mg/dL — ABNORMAL HIGH (ref 65–99)

## 2016-03-24 LAB — BASIC METABOLIC PANEL
ANION GAP: 11 (ref 5–15)
BUN: 12 mg/dL (ref 6–20)
CHLORIDE: 92 mmol/L — AB (ref 101–111)
CO2: 29 mmol/L (ref 22–32)
CREATININE: 0.77 mg/dL (ref 0.44–1.00)
Calcium: 7.9 mg/dL — ABNORMAL LOW (ref 8.9–10.3)
GFR calc non Af Amer: >60 mL/min (ref >60)
GLUCOSE: 243 mg/dL — AB (ref 65–99)
Potassium: 3.3 mmol/L — ABNORMAL LOW (ref 3.5–5.1)
Sodium: 132 mmol/L — ABNORMAL LOW (ref 135–145)

## 2016-03-24 LAB — HEPATIC FUNCTION PANEL
ALT: 27 U/L (ref 14–54)
AST: 19 U/L (ref 15–41)
Albumin: 2.8 g/dL — ABNORMAL LOW (ref 3.5–5.0)
Alkaline Phosphatase: 60 U/L (ref 38–126)
Bilirubin, Direct: 0.2 mg/dL (ref 0.1–0.5)
Indirect Bilirubin: 0.5 mg/dL (ref 0.3–0.9)
Total Bilirubin: 0.7 mg/dL (ref 0.3–1.2)
Total Protein: 7 g/dL (ref 6.5–8.1)

## 2016-03-24 MED ORDER — HYDROCODONE-ACETAMINOPHEN 10-325 MG PO TABS: 1.0000 | ORAL_TABLET | Freq: Four times a day (QID) | ORAL | 0 refills | Status: DC | PRN

## 2016-03-24 MED ORDER — INSULIN STARTER KIT- PEN NEEDLES (ENGLISH)
1.0000 | Freq: Once | Status: DC
  Filled 2016-03-24: qty 1

## 2016-03-24 MED ORDER — INSULIN ASPART 100 UNIT/ML ~~LOC~~ SOLN: 0.0000 [IU] | Freq: Three times a day (TID) | SUBCUTANEOUS | 11 refills | Status: DC

## 2016-03-24 MED ORDER — LIVING WELL WITH DIABETES BOOK
Freq: Once | Status: DC
  Filled 2016-03-24: qty 1

## 2016-03-24 NOTE — Progress Notes (Signed)
Patient being d/c home with prescriptions and instructions. IV cath removed and intact. No c/o pain at the site or at this time. Verbalizes understanding. Waiting on insulin kit before d/c home.

## 2016-03-24 NOTE — Progress Notes (Addendum)
Inpatient Diabetes Program Recommendations  AACE/ADA: New Consensus Statement on Inpatient Glycemic Control (2015)  Target Ranges:  Prepandial:   less than 140 mg/dL      Peak postprandial:   less than 180 mg/dL (1-2 hours)      Critically ill patients:  140 - 180 mg/dL   Lab Results  Component Value Date   GLUCAP 241 (H) 03/24/2016    Review of Glycemic ControlResults for Terri [name], Deira M (MRN [id number]) as of 03/24/2016 12:17  Ref. Range 03/23/2016 11:19 03/23/2016 16:39 03/23/2016 21:00 03/24/2016 07:33 03/24/2016 11:40  Glucose-Capillary Latest Ref Range: 65 - 99 mg/dL 409286 (H) 811166 (H) 914216 (H) 247 (H) 241 (H)   Diabetes history: Type 2 diabetes Outpatient Diabetes medications:  Metformin 500 mg bid  Current orders for Inpatient glycemic control:  Novolog resistant tid with meals and HS, Metformin 500 mg bid  Inpatient Diabetes Program Recommendations:   Note that CBG's continue to be elevated.  May consider adding Lantus 20 units daily for discharge.  Thanks, Beryl MeagerJenny Vila Dory, RN, BC-ADM Inpatient Diabetes Coordinator Pager [phone] (8a-5p)  [phone]  Addendum:  Spoke with patient by phone.  Discussed survival skills of diabetes including hypoglycemia and treatment, hyperglycemia, follow-up and monitoring.  Patient states that nurse has also been working with her on insulin administration and teaching.  Discussed the 2 types of insulin that she has been ordered including Lantus (long acting) and Novolog (to be given with meals based on blood sugar level).  Note patient also has f/u scheduled with outpatient diabetes educator.  Discussed with RN also.

## 2016-03-24 NOTE — Care Management Note (Signed)
Case Management Note  Patient Details  Name: Terri [name] MRN: [id number] Date of Birth: [birth year]  Expected Discharge Date:      03/24/2016            Expected Discharge Plan:  Home/Self Care  In-House Referral:  NA  Discharge planning Services  CM Consult  Post Acute Care Choice:  NA Choice offered to:  NA  DME Arranged:    DME Agency:     HH Arranged:    HH Agency:     Status of Service:  Completed, signed off  If discussed at [employer] Length of Stay Meetings, dates discussed:    Additional Comments: Anticipate DC home today. Pt will be going home with IV insulin. Pt has glucometer prior to admission. Pt has PCP and insurance with drug coverage. Pt states her only concern will be her diet and what she is supposed and not supposed to eat. Pt will be provided diet info with her DC instructions. Consults also placed for diabetes coordinator and dietician.   Malcolm Metrohildress, Melitza Metheny Demske, RN 03/24/2016, 11:43 AM

## 2016-03-24 NOTE — Discharge Summary (Signed)
Physician Discharge Summary  Nance PewBarbara M Jhaveri [id number] DOB: 11/04/1965 DOA: 03/20/2016  PCP: Isabella StallingNDIEGO,Tanekia Ryans M, MD  Admit date: 03/20/2016 Discharge date: 03/24/2016   Recommendations for Outpatient Follow-up:  Patient is advised to take 7030 insulin before meals 3 times a day reduce carbohydrates in general to ambulate at home and follow-up with Dr. Lovell SheehanJenkins surgery for removal of drain to follow-up in my office within one week's time to assess hemodynamic control and light glycemic control Discharge Diagnoses:  Principal Problem:   Cholecystitis Active Problems:   Essential hypertension   DM (diabetes mellitus) (HCC)   Acute cholecystitis   Discharge Condition: Good  Filed Weights   03/20/16 1752  Weight: 117.9 kg (260 lb)    History of present illness:  50 year old black female history of type 2 diabetes hypertension hyperlipidemia Center with a two-week history of off-and-on right upper quadrant tenderness she was found to have thickened gallbladder wall with sludge admitted placed on antibiotics empirically and cholecystectomy performed on the following morning cholecystectomy laparoscopically with a drainage tube in place was noted to her to have significant hyperglycemia postop she was placed on sliding scale insulin roof noticed that her insulin requirements were significantly high although new to insulin this was titrated upwards where she had 7030 insulin 20 units before meals 3 times a day 4 to achieve fair glycemic control once this was stabilized was hemodynamically stabilized and was felt by surgery myself was okay for her to be discharged with close follow-up by surgery and myself for the above issues that are mentioned  Hospital Course:  See history of present illness above  Procedures:  Laparoscopic cholecystectomy  Consultations:  Surgery  Discharge Instructions    No Known Allergies    The results of significant diagnostics from this  hospitalization (including imaging, microbiology, ancillary and laboratory) are listed below for reference.    Significant Diagnostic Studies: Dg Chest 2 View  Result Date: 03/20/2016 CLINICAL DATA:  Right thoracic pain.  Heartburn. EXAM: CHEST  2 VIEW COMPARISON:  None. FINDINGS: The heart size and mediastinal contours are within normal limits. Both lungs are clear. The visualized skeletal structures are unremarkable. IMPRESSION: No active cardiopulmonary disease. Electronically Signed   By: Ted Mcalpineobrinka  Dimitrova M.D.   On: 03/20/2016 19:11   [place] Abdomen Limited  Result Date: 03/20/2016 CLINICAL DATA:  Nausea for 4 days. EXAM: US ABDOMEN LIMITED - RIGHT UPPER QUADRANT COMPARISON:  None. FINDINGS: Gallbladder: Multiple small stones are identified in the gallbladder measuring up to 0.6 cm. There is pericholecystic fluid and gallbladder wall thickening. Sonographer reports negative Murphy's sign. There may also be a 0.8 cm gallbladder polyp. Common bile duct: Diameter: 0.7 cm Liver: The liver demonstrates increased echogenicity without focal lesion. IMPRESSION: Although sonographer of reports negative Murphy's sign, stones, wall thickening and pericholecystic fluid are compatible with acute cholecystitis. Fatty infiltration of the liver. The common bile duct is at the upper limits of normal measuring 0.7 cm but no stone is seen within the duct. Electronically Signed   By: Drusilla Kannerhomas  Dalessio M.D.   On: 03/20/2016 18:35    Microbiology: No results found for this or any previous visit (from the past 240 hour(s)).   Labs: Basic Metabolic Panel:  Recent Labs Lab 03/20/16 1840 03/21/16 0549 03/22/16 0552 03/23/16 0555 03/24/16 0650  NA 130* 135 132* 132* 132*  K 3.2* 3.5 3.5 3.4* 3.3*  CL 96* 96* 98* 92* 92*  CO2 25 27 25 30 29   GLUCOSE 218* 226* 235* 232*  243*  BUN 9 9 11 11 12   CREATININE 0.60 0.67 0.73 0.78 0.77  CALCIUM 8.6* 8.8* 7.8* 8.0* 7.9*  MG  --   --  1.8  --   --   PHOS  --   --   2.3*  --   --    Liver Function Tests:  Recent Labs Lab 03/20/16 1840 03/22/16 0552 03/23/16 0555 03/24/16 0650  AST 17 70* 22 19  ALT 19 54 36 27  ALKPHOS 48 51 53 60  BILITOT 0.9 1.3* 0.8 0.7  PROT 8.0 6.7 6.8 7.0  ALBUMIN 3.9 3.0* 2.9* 2.8*    Recent Labs Lab 03/20/16 1840  LIPASE 17   No results for input(s): AMMONIA in the last 168 hours. CBC:  Recent Labs Lab 03/20/16 1840 03/21/16 0549 03/21/16 1624 03/22/16 0552 03/23/16 0555 03/24/16 0650  WBC 15.3* 15.1*  --  13.0* 14.4* 13.5*  NEUTROABS 12.1*  --   --   --   --   --   HGB 11.9* 11.3* 10.7* 9.9* 9.5* 9.4*  HCT 36.4 34.7* 34.3* 30.7* 29.6* 29.3*  MCV 92.6 93.3  --  93.9 93.7 92.4  PLT 198 198  --  214 218 230   Cardiac Enzymes: No results for input(s): CKTOTAL, CKMB, CKMBINDEX, TROPONINI in the last 168 hours. BNP: BNP (last 3 results) No results for input(s): BNP in the last 8760 hours.  ProBNP (last 3 results) No results for input(s): PROBNP in the last 8760 hours.  CBG:  Recent Labs Lab 03/23/16 1119 03/23/16 1639 03/23/16 2100 03/24/16 0733 03/24/16 1140  GLUCAP 286* 166* 216* 247* 241*       Signed:  Jakson Delpilar M  [facility] Pager: [phone] 03/24/2016, 12:14 PM

## 2016-03-27 ENCOUNTER — Encounter (HOSPITAL_COMMUNITY): Payer: Self-pay | Admitting: General Surgery

## 2016-03-31 ENCOUNTER — Ambulatory Visit (HOSPITAL_COMMUNITY)

## 2016-05-15 ENCOUNTER — Encounter: Payer: Self-pay | Admitting: Nutrition

## 2016-05-15 ENCOUNTER — Encounter: Payer: BC Managed Care – PPO | Attending: Family Medicine | Admitting: Nutrition

## 2016-05-15 VITALS — Ht 66.0 in | Wt 256.0 lb

## 2016-05-15 DIAGNOSIS — IMO0002 Reserved for concepts with insufficient information to code with codable children: Secondary | ICD-10-CM

## 2016-05-15 DIAGNOSIS — E119 Type 2 diabetes mellitus without complications: Secondary | ICD-10-CM | POA: Diagnosis not present

## 2016-05-15 DIAGNOSIS — E1165 Type 2 diabetes mellitus with hyperglycemia: Secondary | ICD-10-CM

## 2016-05-15 DIAGNOSIS — E118 Type 2 diabetes mellitus with unspecified complications: Secondary | ICD-10-CM

## 2016-05-15 DIAGNOSIS — Z794 Long term (current) use of insulin: Secondary | ICD-10-CM

## 2016-05-15 NOTE — Patient Instructions (Signed)
Goals 1. Follow Plate Method 2. Increase fresh fruits and vegetables 3. Drink only water Cut out snacks between meals.---only veggies for snacks 4. Exercise 30 minutes 3 tiimes per week 5. Eat 2-3 carb choices per meal (30-45 grams- 6 .Lose 1-2 lbs per week Get A1C down to 7%.

## 2016-05-15 NOTE — Progress Notes (Signed)
Diabetes Self-Management Education  Visit Type: First/Initial  Appt. Start Time: 1530 Appt. End Time: 1630  05/15/2016  [name], identified by name and date of birth, is a 50 y.o. female with a diagnosis of Diabetes: Type 2. . LIves by herself. She is a Data processing manager. Eats three meals per day.. Testing 3-4 times per day. Takes 20 units of Lantus at night. 500 mg of Metformin BID. Marland Kitchen Had gallbladder out last month. Never met with RD/CDE before. FBS  110-140  Before lunch; 175 mg/dl and Before supper: 114-160's Bedtime:  A1C  8.5% ?  Starting to exercise.   Eats 2-3 meals per day. Tends to overeat at certain meals and snacks. Wants to get off insulin and lose weight.  Diet is excessive in CHO, protein, fat and low in fresh fruits, low carb vegetables and water intake.  ASSESSMENT  Height 5' 6" (1.676 m), weight 256 lb (116.1 kg). Body mass index is 41.32 kg/m.      Diabetes Self-Management Education - 05/15/16 1654      Initial Visit   Diabetes Type Type 2   Are you currently following a meal plan? No   Are you taking your medications as prescribed? Yes     Health Coping   How would you rate your overall health? Good     Psychosocial Assessment   Patient Belief/Attitude about Diabetes Motivated to manage diabetes   Self-care barriers None   Self-management support Friends;Family;Doctor's office   Other persons present Patient   Patient Concerns Nutrition/Meal planning;Medication;Monitoring;Healthy Lifestyle;Problem Solving;Glycemic Control;Weight Control   Special Needs None   Preferred Learning Style No preference indicated   Learning Readiness Ready   How often do you need to have someone help you when you read instructions, pamphlets, or other written materials from your doctor or pharmacy? 1 - Never   What is the last grade level you completed in school? 18     Pre-Education Assessment   Patient understands the diabetes disease and treatment process.  Needs Instruction   Patient understands incorporating nutritional management into lifestyle. Needs Instruction   Patient undertands incorporating physical activity into lifestyle. Needs Instruction   Patient understands using medications safely. Needs Instruction   Patient understands monitoring blood glucose, interpreting and using results Needs Instruction   Patient understands prevention, detection, and treatment of acute complications. Needs Instruction   Patient understands prevention, detection, and treatment of chronic complications. Needs Instruction   Patient understands how to develop strategies to address psychosocial issues. Needs Instruction   Patient understands how to develop strategies to promote health/change behavior. Needs Instruction     Complications   Last HgB A1C per patient/outside source 8.5 %   How often do you check your blood sugar? 3-4 times/day   Fasting Blood glucose range (mg/dL) 130-179   Postprandial Blood glucose range (mg/dL) 180-200   Number of hypoglycemic episodes per month 0   Number of hyperglycemic episodes per week 15   Can you tell when your blood sugar is high? Yes   What do you do if your blood sugar is high? nothing, or water   Have you had a dilated eye exam in the past 12 months? No   Have you had a dental exam in the past 12 months? Yes   Are you checking your feet? No     Dietary Intake   Breakfast Cinnamon toast or [place] bacon or left overs and juice or diet soda   Snack (morning) fruit, cookies/chips  Lunch pasta salad with chicken, cherry tomatoes, salad and water or crystals ligh   Snack (afternoon) misc   Dinner [employer], large fries   Snack (evening) misc   Beverage(s) diet soda, water, juice     Exercise   Exercise Type Light (walking / raking leaves)   How many days per week to you exercise? 3   How many minutes per day do you exercise? 20   Total minutes per week of exercise 60     Patient Education   Previous  Diabetes Education No   Disease state  Definition of diabetes, type 1 and 2, and the diagnosis of diabetes;Factors that contribute to the development of diabetes;Explored patient's options for treatment of their diabetes   Nutrition management  Role of diet in the treatment of diabetes and the relationship between the three main macronutrients and blood glucose level;Food label reading, portion sizes and measuring food.;Carbohydrate counting;Reviewed blood glucose goals for pre and post meals and how to evaluate the patients' food intake on their blood glucose level.;Meal timing in regards to the patients' current diabetes medication.;Information on hints to eating out and maintain blood glucose control.;Meal options for control of blood glucose level and chronic complications.   Physical activity and exercise  Role of exercise on diabetes management, blood pressure control and cardiac health.;Identified with patient nutritional and/or medication changes necessary with exercise.;Helped patient identify appropriate exercises in relation to his/her diabetes, diabetes complications and other health issue.   Medications Taught/reviewed insulin injection, site rotation, insulin storage and needle disposal.;Reviewed patients medication for diabetes, action, purpose, timing of dose and side effects.;Reviewed medication adjustment guidelines for hyperglycemia and sick days.   Monitoring Taught/evaluated SMBG meter.;Purpose and frequency of SMBG.;Taught/discussed recording of test results and interpretation of SMBG.;Identified appropriate SMBG and/or A1C goals.;Daily foot exams;Interpreting lab values - A1C, lipid, urine microalbumina.   Acute complications Discussed and identified patients' treatment of hyperglycemia.   Chronic complications Relationship between chronic complications and blood glucose control;Lipid levels, blood glucose control and heart disease;Nephropathy, what it is, prevention of, the use of ACE,  ARB's and early detection of through urine microalbumia.;Retinopathy and reason for yearly dilated eye exams;Assessed and discussed foot care and prevention of foot problems   Psychosocial adjustment Worked with patient to identify barriers to care and solutions;Role of stress on diabetes   Personal strategies to promote health Lifestyle issues that need to be addressed for better diabetes care;Helped patient develop diabetes management plan for (enter comment)     Individualized Goals (developed by patient)   Nutrition Follow meal plan discussed;General guidelines for healthy choices and portions discussed;Adjust meds/carbs with exercise as discussed   Physical Activity Exercise 3-5 times per week;30 minutes per day   Medications take my medication as prescribed   Monitoring  test my blood glucose as discussed;send in my blood glucose log as discussed   Reducing Risk examine blood glucose patterns;get labs drawn;increase portions of healthy fats;increase portions of nuts and seeds;do foot checks daily     Post-Education Assessment   Patient understands the diabetes disease and treatment process. Needs Review   Patient understands incorporating nutritional management into lifestyle. Needs Review   Patient undertands incorporating physical activity into lifestyle. Needs Review   Patient understands using medications safely. Needs Review   Patient understands monitoring blood glucose, interpreting and using results Needs Review   Patient understands prevention, detection, and treatment of acute complications. Needs Review   Patient understands prevention, detection, and treatment of chronic complications. Needs Review  Patient understands how to develop strategies to address psychosocial issues. Needs Review   Patient understands how to develop strategies to promote health/change behavior. Needs Review     Outcomes   Future DMSE 4-6 wks   Program Status Completed      Individualized Plan  for Diabetes Self-Management Training:   Learning Objective:  Patient will have a greater understanding of diabetes self-management. Patient education plan is to attend individual and/or group sessions per assessed needs and concerns.   Plan:   Patient Instructions  Goals 1. Follow Plate Method 2. Increase fresh fruits and vegetables 3. Drink only water Cut out snacks between meals.---only veggies for snacks 4. Exercise 30 minutes 3 tiimes per week 5. Eat 2-3 carb choices per meal (30-45 grams- 6 .Lose 1-2 lbs per week Get A1C down to 7%.    Expected Outcomes:     Education material provided: Living Well with Diabetes, Food label handouts, A1C conversion sheet, Meal plan card and My Plate  If problems or questions, patient to contact team via:  Phone and Email  Future DSME appointment: 4-6 wks

## 2016-05-27 ENCOUNTER — Telehealth: Payer: Self-pay | Admitting: Nutrition

## 2016-05-27 NOTE — Telephone Encounter (Signed)
TC to pt to discuss BS log that she dropped off. BS are better. Encouraged her to take bedtime insulin with BS over 100 at bedtime. If less than 100, advised to eat some fruit and protein and retest and make sure BS is> 100 and then take her bedtime insulin. She verbalized understanding. Will see at her next appointment.

## 2016-06-17 ENCOUNTER — Encounter: Payer: BC Managed Care – PPO | Attending: Family Medicine | Admitting: Nutrition

## 2016-06-17 ENCOUNTER — Ambulatory Visit: Payer: BC Managed Care – PPO | Admitting: Nutrition

## 2016-06-17 VITALS — Ht 66.0 in | Wt 249.0 lb

## 2016-06-17 DIAGNOSIS — E1165 Type 2 diabetes mellitus with hyperglycemia: Secondary | ICD-10-CM

## 2016-06-17 DIAGNOSIS — IMO0002 Reserved for concepts with insufficient information to code with codable children: Secondary | ICD-10-CM

## 2016-06-17 DIAGNOSIS — Z794 Long term (current) use of insulin: Secondary | ICD-10-CM

## 2016-06-17 DIAGNOSIS — E118 Type 2 diabetes mellitus with unspecified complications: Secondary | ICD-10-CM

## 2016-06-17 DIAGNOSIS — E119 Type 2 diabetes mellitus without complications: Secondary | ICD-10-CM | POA: Diagnosis not present

## 2016-06-17 NOTE — Progress Notes (Signed)
Diabetes Self-Management Education  Visit Type:  Follow-up  Appt. Start Time: 930 Appt. End Time: 1000 06/18/2016  [name], identified by name and date of birth, is a 50 y.o. female with a diagnosis of Diabetes: Type 2.   She has made a lot of changes with eating more fresh fruits and vegetables, cutting out processed foods, eating meals on time and trying to incorporate exercise. She is trying to eat meals on time and avoid snacking between meals. Making progress.  A1C was 9.5% in 03/2016. Going to do blood work today. 30 day avg meeter 141 mg/dl. Log sheets and meter brought. BS much better.    Dr. Janna Archondiego reduced her lantus to 16 today and still taking 500 mg of Metformin BID. She woud benefit from maximizing her Metformin.  ASSESSMENT  Height 5\' 6"  (1.676 m), weight 249 lb (112.9 kg). Body mass index is 40.19 kg/m.       Diabetes Self-Management Education - 06/17/16 0938      Health Coping   How would you rate your overall health? Good     Psychosocial Assessment   Patient Belief/Attitude about Diabetes Motivated to manage diabetes   Self-care barriers None   Self-management support Family   Patient Concerns Nutrition/Meal planning;Healthy Lifestyle;Glycemic Control;Weight Control   Special Needs None   Preferred Learning Style No preference indicated     Pre-Education Assessment   Patient understands the diabetes disease and treatment process. Needs Review   Patient understands incorporating nutritional management into lifestyle. Needs Review   Patient undertands incorporating physical activity into lifestyle. Needs Review   Patient understands using medications safely. Needs Review   Patient understands monitoring blood glucose, interpreting and using results Needs Review   Patient understands prevention, detection, and treatment of acute complications. Needs Review     Complications   Last HgB A1C per patient/outside source --  30 day avg 141  mg/dl   How often do you check your blood sugar? 3-4 times/day   Fasting Blood glucose range (mg/dL) 09-81170-129   Postprandial Blood glucose range (mg/dL) 914-782;956-213130-179;180-200   Number of hypoglycemic episodes per month 0   Number of hyperglycemic episodes per week 5   What do you do if your blood sugar is high? water, exercise   Are you checking your feet? Yes   How many days per week are you checking your feet? 7     Dietary Intake   Breakfast Boiled egg and toast or steak sandwich, water   Snack (morning) veggies   Lunch chicken salad with spinach salad, vinegareete,  water   Dinner Baked potato, beef cube steak, salad and spinach, water   Snack (evening) veggies sticks   Beverage(s) water,      Exercise   How many days per week to you exercise? 15   How many minutes per day do you exercise? 3   Total minutes per week of exercise 45     Patient Education   Disease state  Explored patient's options for treatment of their diabetes   Nutrition management  Food label reading, portion sizes and measuring food.;Carbohydrate counting;Meal timing in regards to the patients' current diabetes medication.;Information on hints to eating out and maintain blood glucose control.   Physical activity and exercise  Role of exercise on diabetes management, blood pressure control and cardiac health.;Identified with patient nutritional and/or medication changes necessary with exercise.;Helped patient identify appropriate exercises in relation to his/her diabetes, diabetes complications and other health issue.   Medications  Reviewed medication adjustment guidelines for hyperglycemia and sick days.   Monitoring Purpose and frequency of SMBG.;Interpreting lab values - A1C, lipid, urine microalbumina.;Identified appropriate SMBG and/or A1C goals.;Taught/discussed recording of test results and interpretation of SMBG.;Taught/evaluated SMBG meter.     Subsequent Visit   Since your last visit have you continued or begun to take  your medications as prescribed? Yes   Since your last visit have you had your blood pressure checked? Yes   Is your most recent blood pressure lower, unchanged, or higher since your last visit? Lower   Since your last visit have you experienced any weight changes? Loss   Weight Loss (lbs) 7   Since your last visit, are you checking your blood glucose at least once a day? Yes      Learning Objective:  Patient will have a greater understanding of diabetes self-management. Patient education plan is to attend individual and/or group sessions per assessed needs and concerns.   Plan:   Patient Instructions  Goals 1. Continue to follow My Plate 2. Eat fruit with meals insteaad of snacks 3. Increase water intake to 84 oz per day 4. Keep exersing; 30 minutes on treadmill 3 times per week. 5. Increase veggies as snack.       Expected Outcomes:     Education material provided: My Plate and Carbohydrate counting sheet  If problems or questions, patient to contact team via:  Phone and Email  Future DSME appointment: -  2-3 months.  She would benefit from increasing to 1000 mg BID of Metformin and reduce Lantus to 10 units per day.

## 2016-06-17 NOTE — Patient Instructions (Addendum)
Goals 1. Continue to follow My Plate 2. Eat fruit with meals insteaad of snacks 3. Increase water intake to 84 oz per day 4. Keep exersing; 30 minutes on treadmill 3 times per week. 5. Increase veggies as snack.

## 2016-08-21 ENCOUNTER — Ambulatory Visit: Payer: BC Managed Care – PPO | Admitting: Nutrition

## 2016-09-03 ENCOUNTER — Other Ambulatory Visit (HOSPITAL_COMMUNITY): Payer: Self-pay | Admitting: Family Medicine

## 2016-09-03 DIAGNOSIS — Z1231 Encounter for screening mammogram for malignant neoplasm of breast: Secondary | ICD-10-CM

## 2016-09-17 ENCOUNTER — Encounter: Payer: BC Managed Care – PPO | Attending: Family Medicine | Admitting: Nutrition

## 2016-09-17 ENCOUNTER — Ambulatory Visit (HOSPITAL_COMMUNITY)
Admission: RE | Admit: 2016-09-17 | Discharge: 2016-09-17 | Disposition: A | Discharge status: Home / Self Care | Payer: BC Managed Care – PPO | Source: Ambulatory Visit | Attending: Family Medicine | Admitting: Family Medicine

## 2016-09-17 VITALS — Wt 258.0 lb

## 2016-09-17 DIAGNOSIS — Z713 Dietary counseling and surveillance: Secondary | ICD-10-CM | POA: Insufficient documentation

## 2016-09-17 DIAGNOSIS — IMO0002 Reserved for concepts with insufficient information to code with codable children: Secondary | ICD-10-CM

## 2016-09-17 DIAGNOSIS — E118 Type 2 diabetes mellitus with unspecified complications: Secondary | ICD-10-CM

## 2016-09-17 DIAGNOSIS — Z794 Long term (current) use of insulin: Secondary | ICD-10-CM

## 2016-09-17 DIAGNOSIS — E119 Type 2 diabetes mellitus without complications: Secondary | ICD-10-CM | POA: Insufficient documentation

## 2016-09-17 DIAGNOSIS — E1165 Type 2 diabetes mellitus with hyperglycemia: Secondary | ICD-10-CM

## 2016-09-17 DIAGNOSIS — Z1231 Encounter for screening mammogram for malignant neoplasm of breast: Secondary | ICD-10-CM | POA: Diagnosis present

## 2016-09-17 NOTE — Progress Notes (Signed)
Diabetes Self-Management Education  Visit Type:  Follow-up  Appt. Start Time: 815 Appt. End Time: 845 am 09/17/2016  [name], identified by name and date of birth, is a 51 y.o. female with a diagnosis of Diabetes:  .  Saw Dr. Janna Archondiego this am. A1C last visit in November was 6.8% per patient, which is down from 9.5%. Requested labs. Gained 9 lbs. Has been craving salty foods of gravy and fried foods.  AVG BS 137 for last 30 days/ ASSESSMENT  Weight 258 lb (117 kg). Body mass index is 41.64 kg/m.       Diabetes Self-Management Education - 09/17/16 0835      Health Coping   How would you rate your overall health? Good     Psychosocial Assessment   Patient Belief/Attitude about Diabetes Motivated to manage diabetes     Complications   Last HgB A1C per patient/outside source 6 %   How often do you check your blood sugar? 1-2 times/day   Fasting Blood glucose range (mg/dL) 16-10970-129   Postprandial Blood glucose range (mg/dL) 60-454;098-11970-129;130-179   Number of hypoglycemic episodes per month 2   Can you tell when your blood sugar is low? Yes   What do you do if your blood sugar is low? eat   Number of hyperglycemic episodes per week 5   Can you tell when your blood sugar is high? Yes   Have you had a dilated eye exam in the past 12 months? Yes   Have you had a dental exam in the past 12 months? Yes   Are you checking your feet? Yes   How many days per week are you checking your feet? 7     Dietary Intake   Breakfast chicken biscuit   Snack (morning) veggies   Lunch ham sandwich, fruit, water   Snack (afternoon) veggies   Dinner 3 chicken wings   Beverage(s) ater     Exercise   Exercise Type Light (walking / raking leaves)   How many days per week to you exercise? 3   How many minutes per day do you exercise? 30   Total minutes per week of exercise 90     Patient Education   Nutrition management  Food label reading, portion sizes and measuring food.;Carbohydrate  counting;Meal timing in regards to the patients' current diabetes medication.;Information on hints to eating out and maintain blood glucose control.   Physical activity and exercise  Identified with patient nutritional and/or medication changes necessary with exercise.;Helped patient identify appropriate exercises in relation to his/her diabetes, diabetes complications and other health issue.     Individualized Goals (developed by patient)   Nutrition Follow meal plan discussed;General guidelines for healthy choices and portions discussed;Adjust meds/carbs with exercise as discussed   Physical Activity Exercise 3-5 times per week;60 minutes per day   Medications take my medication as prescribed   Monitoring  test my blood glucose as discussed;test blood glucose pre and post meals as discussed   Reducing Risk examine blood glucose patterns;do foot checks daily;get labs drawn     Patient Self-Evaluation of Goals - Patient rates self as meeting previously set goals (% of time)   Nutrition >75%   Physical Activity >75%   Medications >75%   Monitoring >75%   Problem Solving >75%   Health Coping >75%     Post-Education Assessment   Patient understands the diabetes disease and treatment process. Demonstrates understanding / competency   Patient understands incorporating nutritional management into  lifestyle. Demonstrates understanding / competency   Patient undertands incorporating physical activity into lifestyle. Demonstrates understanding / competency   Patient understands using medications safely. Demonstrates understanding / competency   Patient understands monitoring blood glucose, interpreting and using results Demonstrates understanding / competency   Patient understands prevention, detection, and treatment of acute complications. Demonstrates understanding / competency   Patient understands prevention, detection, and treatment of chronic complications. Demonstrates understanding /  competency   Patient understands how to develop strategies to address psychosocial issues. Demonstrates understanding / competency   Patient understands how to develop strategies to promote health/change behavior. Demonstrates understanding / competency     Outcomes   Program Status Completed     Subsequent Visit   Since your last visit have you continued or begun to take your medications as prescribed? Yes   Since your last visit have you had your blood pressure checked? Yes   Is your most recent blood pressure lower, unchanged, or higher since your last visit? Lower   Since your last visit have you experienced any weight changes? Gain   Weight Gain (lbs) 9   Since your last visit, are you checking your blood glucose at least once a day? Yes      Learning Objective:  Patient will have a greater understanding of diabetes self-management. Patient education plan is to attend individual and/or group sessions per assessed needs and concerns.   Plan:   Patient Instructions  Goals 1. Cut out gravy and fried foods 2. Use Mrs.Dash 3. Start swimming at the [facility]  4. Try Knute Neu to 5 K app on phone for increased exercise. 5. Lose 1 lb per week Drink gallon of water per day-6-8 bottles per day.    Expected Outcomes:  Demonstrated interest in learning. Expect positive outcomes  Education material provided: Meal plan card and My Plate  If problems or questions, patient to contact team via:  Phone and Email  Future DSME appointment: - 3-4 months

## 2016-09-17 NOTE — Patient Instructions (Addendum)
Goals 1. Cut out gravy and fried foods 2. Use Mrs.Dash 3. [facility] at the [facility]  4. Try Knute NeuCouch to 5 K app on phone for increased exercise. 5. Lose 1 lb per week Drink gallon of water per day-6-8 bottles per day.

## 2016-09-18 ENCOUNTER — Ambulatory Visit: Payer: BC Managed Care – PPO | Admitting: Nutrition

## 2016-12-15 ENCOUNTER — Ambulatory Visit: Payer: BC Managed Care – PPO | Admitting: Nutrition

## 2017-08-26 ENCOUNTER — Other Ambulatory Visit (HOSPITAL_COMMUNITY): Payer: Self-pay | Admitting: Family Medicine

## 2017-08-26 DIAGNOSIS — Z1231 Encounter for screening mammogram for malignant neoplasm of breast: Secondary | ICD-10-CM

## 2017-09-21 ENCOUNTER — Ambulatory Visit (HOSPITAL_COMMUNITY)
Admission: RE | Admit: 2017-09-21 | Discharge: 2017-09-21 | Disposition: A | Discharge status: Home / Self Care | Payer: BC Managed Care – PPO | Source: Ambulatory Visit | Attending: Family Medicine | Admitting: Family Medicine

## 2017-09-21 DIAGNOSIS — Z1231 Encounter for screening mammogram for malignant neoplasm of breast: Secondary | ICD-10-CM

## 2017-11-28 ENCOUNTER — Emergency Department (HOSPITAL_COMMUNITY)
Admission: EM | Admit: 2017-11-28 | Discharge: 2017-11-28 | Disposition: A | Discharge status: Home / Self Care | Payer: BC Managed Care – PPO | Attending: Emergency Medicine | Admitting: Emergency Medicine

## 2017-11-28 ENCOUNTER — Encounter (HOSPITAL_COMMUNITY): Payer: Self-pay | Admitting: *Deleted

## 2017-11-28 DIAGNOSIS — Z9119 Patient's noncompliance with other medical treatment and regimen: Secondary | ICD-10-CM | POA: Insufficient documentation

## 2017-11-28 DIAGNOSIS — R109 Unspecified abdominal pain: Secondary | ICD-10-CM | POA: Diagnosis not present

## 2017-11-28 DIAGNOSIS — I10 Essential (primary) hypertension: Secondary | ICD-10-CM | POA: Diagnosis not present

## 2017-11-28 DIAGNOSIS — Z794 Long term (current) use of insulin: Secondary | ICD-10-CM | POA: Insufficient documentation

## 2017-11-28 DIAGNOSIS — N390 Urinary tract infection, site not specified: Secondary | ICD-10-CM | POA: Insufficient documentation

## 2017-11-28 DIAGNOSIS — R319 Hematuria, unspecified: Secondary | ICD-10-CM | POA: Insufficient documentation

## 2017-11-28 DIAGNOSIS — Z79899 Other long term (current) drug therapy: Secondary | ICD-10-CM | POA: Insufficient documentation

## 2017-11-28 DIAGNOSIS — E119 Type 2 diabetes mellitus without complications: Secondary | ICD-10-CM | POA: Insufficient documentation

## 2017-11-28 DIAGNOSIS — R300 Dysuria: Secondary | ICD-10-CM | POA: Diagnosis present

## 2017-11-28 LAB — CBC WITH DIFFERENTIAL/PLATELET
Basophils Absolute: 0 10*3/uL (ref 0.0–0.1)
Basophils Relative: 0 %
Eosinophils Absolute: 0.2 10*3/uL (ref 0.0–0.7)
Eosinophils Relative: 3 %
HEMATOCRIT: 40.5 % (ref 36.0–46.0)
Hemoglobin: 12.5 g/dL (ref 12.0–15.0)
LYMPHS ABS: 3.5 10*3/uL (ref 0.7–4.0)
LYMPHS PCT: 54 %
MCH: 29 pg (ref 26.0–34.0)
MCHC: 30.9 g/dL (ref 30.0–36.0)
MCV: 94 fL (ref 78.0–100.0)
Monocytes Absolute: 0.4 10*3/uL (ref 0.1–1.0)
Monocytes Relative: 6 %
NEUTROS ABS: 2.4 10*3/uL (ref 1.7–7.7)
NEUTROS PCT: 37 %
Platelets: 217 10*3/uL (ref 150–400)
RBC: 4.31 MIL/uL (ref 3.87–5.11)
RDW: 13.5 % (ref 11.5–15.5)
WBC: 6.6 10*3/uL (ref 4.0–10.5)

## 2017-11-28 LAB — BASIC METABOLIC PANEL
Anion gap: 11 (ref 5–15)
BUN: 17 mg/dL (ref 6–20)
CO2: 27 mmol/L (ref 22–32)
Calcium: 9.3 mg/dL (ref 8.9–10.3)
Chloride: 101 mmol/L (ref 101–111)
Creatinine, Ser: 0.58 mg/dL (ref 0.44–1.00)
GFR calc Af Amer: >60 mL/min (ref >60)
GFR calc non Af Amer: >60 mL/min (ref >60)
Glucose, Bld: 125 mg/dL — ABNORMAL HIGH (ref 65–99)
POTASSIUM: 3.8 mmol/L (ref 3.5–5.1)
Sodium: 139 mmol/L (ref 135–145)

## 2017-11-28 LAB — URINALYSIS, ROUTINE W REFLEX MICROSCOPIC
Bilirubin Urine: NEGATIVE
Glucose, UA: NEGATIVE mg/dL
Ketones, ur: NEGATIVE mg/dL
NITRITE: NEGATIVE
Protein, ur: 100 mg/dL — AB
SPECIFIC GRAVITY, URINE: 1.027 (ref 1.005–1.030)
SQUAMOUS EPITHELIAL / LPF: NONE SEEN
pH: 6 (ref 5.0–8.0)

## 2017-11-28 MED ORDER — CEPHALEXIN 500 MG PO CAPS: 500.0000 mg | ORAL_CAPSULE | Freq: Four times a day (QID) | ORAL | 0 refills | Status: DC

## 2017-11-28 MED ORDER — CEPHALEXIN 500 MG PO CAPS
500.0000 mg | ORAL_CAPSULE | Freq: Once | ORAL | Status: AC
  Administered 2017-11-28: 500 mg via ORAL
  Filled 2017-11-28: qty 1

## 2017-11-28 MED ORDER — FLUCONAZOLE 150 MG PO TABS: 150.0000 mg | ORAL_TABLET | Freq: Every day | ORAL | 1 refills | Status: DC

## 2017-11-28 NOTE — Discharge Instructions (Addendum)
Your vital signs have been reviewed.  Your blood pressure is slightly above the normal at 143/91.  Please have this rechecked soon.  Your urine test suggest a urinary tract infection.  I have sent a culture to the lab for evaluation.  Please use Keflex with breakfast, lunch, dinner, and at bedtime until all taken.  Please increase fluids.  Your electrolytes and kidney function seem to be within normal limits.  Your glucose is slightly elevated at 125, but otherwise the test is within normal limits.  Your complete blood count does not show an elevation in your white blood cells.  Please see Dr. Janna Archondiego in 7-10 days for recheck of your urine to document resolution of this infection.

## 2017-11-28 NOTE — ED Triage Notes (Signed)
Pt with frequent urination since this morning, denies burning on urination.  Pt with diabetes, cbg 120 this morning.

## 2017-11-28 NOTE — ED Provider Notes (Signed)
[facility] EMERGENCY DEPARTMENT Provider Note   CSN: [id number] Arrival date & time: 11/28/17  1928     History   Chief Complaint Chief Complaint  Patient presents with  . Dysuria    HPI Terri [name] is a 52 y.o. female.  Patient reports history of diabetes mellitus.  She is on metformin during the day, and insulin at night.  Patient acknowledges that she is not been following her diet, and is actually had potato chips and cookies earlier today.  She denies any fever recently.  She has noted some blood in her urine accompanied by a cramping type sensation.  The history is provided by the patient.  Hematuria  This is a new problem. The current episode started 3 to 5 hours ago. The problem has been gradually worsening. Associated symptoms include abdominal pain. Pertinent negatives include no chest pain and no shortness of breath. Associated symptoms comments: Lower abd cramping.. Nothing aggravates the symptoms. Nothing relieves the symptoms. She has tried nothing for the symptoms.    Past Medical History:  Diagnosis Date  . Diabetes mellitus without complication (HCC)   . Hyperlipidemia   . Hypertension     Patient Active Problem List   Diagnosis Date Noted  . Acute cholecystitis 03/21/2016  . Cholecystitis 03/20/2016  . DM (diabetes mellitus) (HCC) 03/20/2016  . Essential hypertension     Past Surgical History:  Procedure Laterality Date  . ABDOMINAL HYSTERECTOMY    . ANKLE FUSION Right   . CHOLECYSTECTOMY N/A 03/21/2016   Procedure: LAPAROSCOPIC CHOLECYSTECTOMY;  Surgeon: Franky Macho, MD;  Location: [facility];  Service: General;  Laterality: N/A;     OB History   None      Home Medications    Prior to Admission medications   Medication Sig Start Date End Date Taking? Authorizing Provider  HYDROcodone-acetaminophen (NORCO) 10-325 MG tablet Take 1 tablet by mouth every 6 (six) hours as needed. 03/24/16   Oval Linsey, MD  insulin aspart (NOVOLOG)  100 UNIT/ML injection Inject 0-20 Units into the skin 3 (three) times daily with meals. 03/24/16   Oval Linsey, MD  insulin glargine (LANTUS) 100 UNIT/ML injection Inject 20 Units into the skin at bedtime.    [provider]  metFORMIN (GLUCOPHAGE) 500 MG tablet Take 500 mg by mouth 2 (two) times daily with a meal.    [provider]  omeprazole (PRILOSEC) 20 MG capsule Take 20 mg by mouth daily.    [provider]  rosuvastatin (CRESTOR) 10 MG tablet Take 10 mg by mouth daily.    [provider]  valsartan-hydrochlorothiazide (DIOVAN-HCT) 160-12.5 MG per tablet Take 1 tablet by mouth daily.    [provider]    Family History History reviewed. No pertinent family history.  Social History Social History   Tobacco Use  . Smoking status: Never Smoker  . Smokeless tobacco: Never Used  Substance Use Topics  . Alcohol use: No  . Drug use: No     Allergies   Patient has no known allergies.   Review of Systems Review of Systems  Constitutional: Negative for activity change, chills and fever.       All ROS Neg except as noted in HPI  HENT: Negative for nosebleeds.   Eyes: Negative for photophobia and discharge.  Respiratory: Negative for cough, shortness of breath and wheezing.   Cardiovascular: Negative for chest pain and palpitations.  Gastrointestinal: Positive for abdominal pain. Negative for blood in stool, nausea and vomiting.  Genitourinary: Positive for hematuria. Negative for dysuria, flank pain and frequency.  Musculoskeletal: Negative for arthralgias, back pain and neck pain.  Skin: Negative.   Neurological: Negative for dizziness, seizures and speech difficulty.  Psychiatric/Behavioral: Negative for confusion and hallucinations.     Physical Exam Updated Vital Signs BP (!) 152/101 (BP Location: Left Arm)   Temp 98.2 F (36.8 C) (Oral)   Ht 5\' 6"  (1.676 m)   Wt 113.4 kg (250 lb)   BMI 40.35 kg/m   Physical  Exam  Constitutional: She is oriented to person, place, and time. She appears well-developed and well-nourished.  Non-toxic appearance.  HENT:  Head: Normocephalic.  Right Ear: Tympanic membrane and external ear normal.  Left Ear: Tympanic membrane and external ear normal.  Eyes: Pupils are equal, round, and reactive to light. EOM and lids are normal.  Neck: Normal range of motion. Neck supple. Carotid bruit is not present.  Cardiovascular: Normal rate, regular rhythm, normal heart sounds, intact distal pulses and normal pulses.  Pulmonary/Chest: Breath sounds normal. No respiratory distress.  Abdominal: Soft. Bowel sounds are normal. There is no tenderness. There is no guarding.  No cvat.  Musculoskeletal: Normal range of motion.  Lymphadenopathy:       Head (right side): No submandibular adenopathy present.       Head (left side): No submandibular adenopathy present.    She has no cervical adenopathy.  Neurological: She is alert and oriented to person, place, and time. She has normal strength. No cranial nerve deficit or sensory deficit.  Skin: Skin is warm and dry.  Psychiatric: She has a normal mood and affect. Her speech is normal.  Nursing note and vitals reviewed.    ED Treatments / Results  Labs (all labs ordered are listed, but only abnormal results are displayed) Labs Reviewed  URINALYSIS, ROUTINE W REFLEX MICROSCOPIC  CBC WITH DIFFERENTIAL/PLATELET  BASIC METABOLIC PANEL    EKG None  Radiology No results found.  Procedures Procedures (including critical care time)  Medications Ordered in ED Medications - No data to display   Initial Impression / Assessment and Plan / ED Course  I have reviewed the triage vital signs and the nursing notes.  Pertinent labs & imaging results that were available during my care of the patient were reviewed by me and considered in my medical decision making (see chart for details).       Final Clinical Impressions(s) / ED  Diagnoses MDm  Vital signs reviewed.  Urinalysis shows large amount of hemoglobin in the urine.  There are moderate leukocyte esterase present, as well as too many to count red blood cells and white blood cells.  A culture the urine has been sent to the lab.  The complete blood count is within normal limits.  The metabolic panel shows the glucose to be elevated at 125.  The BUN, creatinine, and glomerular filtration rate are all within normal limits.  Patient will be treated with Keflex.  The patient is to increase fluids.  The patient is to have her urine rechecked in 7-10 days to ensure resolution of this issue.  The patient acknowledges understanding and is in agreement.   Final diagnoses:  Lower urinary tract infectious disease    ED Discharge Orders        Ordered    cephALEXin (KEFLEX) 500 MG capsule  4 times daily     11/28/17 2103    fluconazole (DIFLUCAN) 150 MG tablet  Daily     11/28/17 2124  Ivery QualeBryant, Jamyla Ard, PA-C 11/29/17 0919    Samuel JesterMcManus, Kathleen, DO 11/29/17 1746

## 2017-12-01 LAB — URINE CULTURE: Culture: >=100000 — AB

## 2017-12-02 ENCOUNTER — Telehealth: Payer: Self-pay | Admitting: Emergency Medicine

## 2017-12-02 NOTE — Telephone Encounter (Signed)
Post ED Visit - Positive Culture Follow-up  Culture report reviewed by antimicrobial stewardship pharmacist:  []  Enzo BiNathan Batchelder, Pharm.D. []  Celedonio MiyamotoJeremy Frens, Pharm.D., BCPS AQ-ID []  Garvin FilaMike Maccia, Pharm.D., BCPS []  Georgina PillionElizabeth Martin, Pharm.D., BCPS []  HollywoodMinh Pham, 17[address] Rainbow BoulevardPharm.D., BCPS, AAHIVP []  Estella HuskMichelle Turner, Pharm.D., BCPS, AAHIVP []  Lysle Pearlachel Rumbarger, PharmD, BCPS []  Blake DivineShannon Parkey, PharmD []  Pollyann SamplesAndy Johnston, PharmD, BCPS Sharin MonsEmily Sinclair PharmD  Positive urine culture Treated with cephalexin and fluconazole, organism sensitive to the same and no further patient follow-up is required at this time.  Berle MullMiller, Daisha Filosa 12/02/2017, 1:39 PM

## 2019-03-21 ENCOUNTER — Encounter (HOSPITAL_COMMUNITY): Payer: BC Managed Care – PPO

## 2019-03-21 ENCOUNTER — Other Ambulatory Visit (HOSPITAL_COMMUNITY): Payer: Self-pay | Admitting: Family Medicine

## 2019-03-21 DIAGNOSIS — Z1231 Encounter for screening mammogram for malignant neoplasm of breast: Secondary | ICD-10-CM

## 2019-03-23 ENCOUNTER — Other Ambulatory Visit: Payer: Self-pay

## 2019-03-23 ENCOUNTER — Other Ambulatory Visit (HOSPITAL_COMMUNITY): Payer: Self-pay | Admitting: Family Medicine

## 2019-03-23 ENCOUNTER — Ambulatory Visit (HOSPITAL_COMMUNITY)
Admission: RE | Admit: 2019-03-23 | Discharge: 2019-03-23 | Disposition: A | Discharge status: Home / Self Care | Payer: BC Managed Care – PPO | Source: Ambulatory Visit | Attending: Family Medicine | Admitting: Family Medicine

## 2019-03-23 DIAGNOSIS — Z1231 Encounter for screening mammogram for malignant neoplasm of breast: Secondary | ICD-10-CM | POA: Insufficient documentation

## 2020-02-28 ENCOUNTER — Other Ambulatory Visit (HOSPITAL_COMMUNITY): Payer: Self-pay | Admitting: Family Medicine

## 2020-02-28 DIAGNOSIS — Z1231 Encounter for screening mammogram for malignant neoplasm of breast: Secondary | ICD-10-CM

## 2020-03-01 ENCOUNTER — Encounter (HOSPITAL_COMMUNITY): Payer: BC Managed Care – PPO

## 2020-03-23 ENCOUNTER — Ambulatory Visit (HOSPITAL_COMMUNITY)
Admission: RE | Admit: 2020-03-23 | Discharge: 2020-03-23 | Disposition: A | Discharge status: Home / Self Care | Payer: BC Managed Care – PPO | Source: Ambulatory Visit | Attending: Family Medicine | Admitting: Family Medicine

## 2020-03-23 ENCOUNTER — Other Ambulatory Visit: Payer: Self-pay

## 2020-03-23 DIAGNOSIS — Z1231 Encounter for screening mammogram for malignant neoplasm of breast: Secondary | ICD-10-CM

## 2021-02-14 ENCOUNTER — Encounter: Payer: Self-pay | Admitting: Nurse Practitioner

## 2021-02-14 ENCOUNTER — Ambulatory Visit (INDEPENDENT_AMBULATORY_CARE_PROVIDER_SITE_OTHER): Payer: BC Managed Care – PPO | Admitting: Nurse Practitioner

## 2021-02-14 ENCOUNTER — Other Ambulatory Visit: Payer: Self-pay

## 2021-02-14 VITALS — BP 156/76 | HR 90 | Temp 97.7°F | Ht 66.0 in | Wt 263.0 lb

## 2021-02-14 DIAGNOSIS — I10 Essential (primary) hypertension: Secondary | ICD-10-CM

## 2021-02-14 DIAGNOSIS — E119 Type 2 diabetes mellitus without complications: Secondary | ICD-10-CM

## 2021-02-14 DIAGNOSIS — Z7689 Persons encountering health services in other specified circumstances: Secondary | ICD-10-CM

## 2021-02-14 DIAGNOSIS — Z794 Long term (current) use of insulin: Secondary | ICD-10-CM | POA: Diagnosis not present

## 2021-02-14 DIAGNOSIS — Z7902 Long term (current) use of antithrombotics/antiplatelets: Secondary | ICD-10-CM

## 2021-02-14 DIAGNOSIS — E785 Hyperlipidemia, unspecified: Secondary | ICD-10-CM | POA: Diagnosis not present

## 2021-02-14 DIAGNOSIS — Z0001 Encounter for general adult medical examination with abnormal findings: Secondary | ICD-10-CM | POA: Insufficient documentation

## 2021-02-14 DIAGNOSIS — E0801 Diabetes mellitus due to underlying condition with hyperosmolarity with coma: Secondary | ICD-10-CM

## 2021-02-14 DIAGNOSIS — Z79811 Long term (current) use of aromatase inhibitors: Secondary | ICD-10-CM

## 2021-02-14 DIAGNOSIS — Z0000 Encounter for general adult medical examination without abnormal findings: Secondary | ICD-10-CM | POA: Insufficient documentation

## 2021-02-14 DIAGNOSIS — Z139 Encounter for screening, unspecified: Secondary | ICD-10-CM

## 2021-02-14 NOTE — Assessment & Plan Note (Signed)
-  obtain records 

## 2021-02-14 NOTE — Assessment & Plan Note (Signed)
-  takes valsartan-HCTZ 160-12.5mg  BP Readings from Last 3 Encounters:  02/14/21 (!) 156/76  11/28/17 (!) 143/91  03/24/16 110/67   -she doesn't check this at home, has BP cuff

## 2021-02-14 NOTE — Progress Notes (Signed)
New Patient Office Visit  Subjective:  Patient ID: Terri [name], female    DOB: [birth year]  Age: 55 y.o. MRN: [id number]  CC:  Chief Complaint  Patient presents with   New Patient (Initial Visit)    Here to establish care. Concerned about weight, has tried keto diet. She is diabetic and would like to get the dexcom or some info regarding this.     HPI Terri [name] presents for new patient visit. Transferring care from Dr. Cindie Laroche. Last physical was years ago. Last labs were drawn on 11/20/20    Past Medical History:  Diagnosis Date   Arthritis    right hip and knee pain; had right ankle fusion   Diabetes mellitus without complication ([place])    Hyperlipidemia    Hypertension     Past Surgical History:  Procedure Laterality Date   ABDOMINAL HYSTERECTOMY     total   ANKLE FUSION Right    APPENDECTOMY     CHOLECYSTECTOMY N/A 03/21/2016   Procedure: LAPAROSCOPIC CHOLECYSTECTOMY;  Surgeon: Aviva Signs, MD;  Location: [facility];  Service: General;  Laterality: N/A;    History reviewed. No pertinent family history.  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  Tobacco Use   Smoking status: Never   Smokeless tobacco: Never  Vaping Use   Vaping Use: Never used  Substance and Sexual Activity   Alcohol use: No   Drug use: No   Sexual activity: Yes    Birth control/protection: Surgical  Other Topics Concern   Not on file  Social History Narrative   3 adult children; youngest is out of town   Social Determinants of Radio broadcast assistant Strain: Not on file  Food Insecurity: Not on file  Transportation Needs: Not on file  Physical Activity: Not on file  Stress: Not on file  Social Connections: Not on file   Intimate Partner Violence: Not on file    ROS Review of Systems  Objective:   Today's Vitals: BP (!) 156/76 (BP Location: Right Arm, Patient Position: Sitting, Cuff Size: Large)   Pulse 90   Temp 97.7 F (36.5 C) (Temporal)   Ht _0  (1.676 m)   Wt 263 lb (119.3 kg)   SpO2 96%   BMI 42.45 kg/m   Physical Exam  Assessment & Plan:   Problem List Items Addressed This Visit       Cardiovascular and Mediastinum   Essential hypertension    -takes valsartan-HCTZ 160-12.19m BP Readings from Last 3 Encounters:  02/14/21 (!) 156/76  11/28/17 (!) 143/91  03/24/16 110/67   -she doesn't check this at home, has BP cuff       Relevant Orders   CBC with Differential/Platelet   Lipid Panel With LDL/HDL Ratio   CMP14+EGFR     Endocrine   DM (diabetes mellitus) ([place]    -A1c 7.1% on 11/20/20 -takes metformin and tresiba -on statin and ARB       Relevant Medications   insulin degludec (TRESIBA FLEXTOUCH) 200 UNIT/ML FlexTouch Pen   Other Relevant Orders   CBC with Differential/Platelet   Lipid Panel With LDL/HDL Ratio   CMP14+EGFR   Hemoglobin A1c  Other   Encounter to establish care - Primary    -obtain records       Relevant Orders   CBC with Differential/Platelet   Lipid Panel With LDL/HDL Ratio   CMP14+EGFR   TSH   Hemoglobin A1c   Hepatitis C antibody   HIV Antibody (routine testing w rflx)   HLD (hyperlipidemia)    -goal LDL < 70 d/t DM -last LDL was 42 in Apr. 2022       Relevant Orders   Lipid Panel With LDL/HDL Ratio   Other Visit Diagnoses     Screening due       Relevant Orders   Hepatitis C antibody   HIV Antibody (routine testing w rflx)       Outpatient Encounter Medications as of 02/14/2021  Medication Sig   insulin degludec (TRESIBA FLEXTOUCH) 200 UNIT/ML FlexTouch Pen Inject 20 Units into the skin at bedtime.   metFORMIN (GLUCOPHAGE) 500 MG tablet Take 500 mg by mouth 2 (two) times daily with a meal.   rosuvastatin  (CRESTOR) 10 MG tablet Take 10 mg by mouth daily.   valsartan-hydrochlorothiazide (DIOVAN-HCT) 160-12.5 MG per tablet Take 1 tablet by mouth daily.   [DISCONTINUED] cephALEXin (KEFLEX) 500 MG capsule Take 1 capsule (500 mg total) by mouth 4 (four) times daily.   [DISCONTINUED] fluconazole (DIFLUCAN) 150 MG tablet Take 1 tablet (150 mg total) by mouth daily.   [DISCONTINUED] HYDROcodone-acetaminophen (NORCO) 10-325 MG tablet Take 1 tablet by mouth every 6 (six) hours as needed.   [DISCONTINUED] insulin aspart (NOVOLOG) 100 UNIT/ML injection Inject 0-20 Units into the skin 3 (three) times daily with meals. (Patient not taking: Reported on 02/14/2021)   [DISCONTINUED] insulin glargine (LANTUS) 100 UNIT/ML injection Inject 20 Units into the skin at bedtime. (Patient not taking: Reported on 02/14/2021)   [DISCONTINUED] omeprazole (PRILOSEC) 20 MG capsule Take 20 mg by mouth daily. (Patient not taking: Reported on 02/14/2021)   No facility-administered encounter medications on file as of 02/14/2021.    Follow-up: Return in about 2 weeks (around 02/28/2021) for Physical Exam.   Noreene Larsson, NP

## 2021-02-14 NOTE — Patient Instructions (Signed)
Please have fasting labs drawn 2-3 days prior to your appointment so we can discuss the results during your office visit.  

## 2021-02-14 NOTE — Assessment & Plan Note (Signed)
-  goal LDL < 70 d/t DM -last LDL was 42 in Apr. 2022

## 2021-02-14 NOTE — Assessment & Plan Note (Signed)
-  A1c 7.1% on 11/20/20 -takes metformin and tresiba -on statin and ARB

## 2021-03-01 LAB — CBC WITH DIFFERENTIAL/PLATELET
Basophils Absolute: 0 10*3/uL (ref 0.0–0.2)
Basos: 0 %
EOS (ABSOLUTE): 0.1 10*3/uL (ref 0.0–0.4)
Eos: 2 %
Hematocrit: 36.5 % (ref 34.0–46.6)
Hemoglobin: 11.9 g/dL (ref 11.1–15.9)
Immature Grans (Abs): 0 10*3/uL (ref 0.0–0.1)
Immature Granulocytes: 0 %
Lymphocytes Absolute: 1.9 10*3/uL (ref 0.7–3.1)
Lymphs: 47 %
MCH: 29 pg (ref 26.6–33.0)
MCHC: 32.6 g/dL (ref 31.5–35.7)
MCV: 89 fL (ref 79–97)
Monocytes Absolute: 0.3 10*3/uL (ref 0.1–0.9)
Monocytes: 8 %
Neutrophils Absolute: 1.7 10*3/uL (ref 1.4–7.0)
Neutrophils: 43 %
Platelets: 168 10*3/uL (ref 150–450)
RBC: 4.11 x10E6/uL (ref 3.77–5.28)
RDW: 12.5 % (ref 11.7–15.4)
WBC: 4 10*3/uL (ref 3.4–10.8)

## 2021-03-01 LAB — LIPID PANEL WITH LDL/HDL RATIO
Cholesterol, Total: 120 mg/dL (ref 100–199)
HDL: 57 mg/dL (ref >39)
LDL Chol Calc (NIH): 48 mg/dL (ref 0–99)
LDL/HDL Ratio: 0.8 ratio (ref 0.0–3.2)
Triglycerides: 71 mg/dL (ref 0–149)
VLDL Cholesterol Cal: 15 mg/dL (ref 5–40)

## 2021-03-01 LAB — HEPATITIS C ANTIBODY: Hep C Virus Ab: <0.1 s/co ratio (ref 0.0–0.9)

## 2021-03-01 LAB — CMP14+EGFR
ALT: 17 IU/L (ref 0–32)
AST: 14 IU/L (ref 0–40)
Albumin/Globulin Ratio: 2 (ref 1.2–2.2)
Albumin: 4.4 g/dL (ref 3.8–4.9)
Alkaline Phosphatase: 45 IU/L (ref 44–121)
BUN/Creatinine Ratio: 28 — ABNORMAL HIGH (ref 9–23)
BUN: 18 mg/dL (ref 6–24)
Bilirubin Total: 0.3 mg/dL (ref 0.0–1.2)
CO2: 22 mmol/L (ref 20–29)
Calcium: 9.5 mg/dL (ref 8.7–10.2)
Chloride: 102 mmol/L (ref 96–106)
Creatinine, Ser: 0.65 mg/dL (ref 0.57–1.00)
Globulin, Total: 2.2 g/dL (ref 1.5–4.5)
Glucose: 140 mg/dL — ABNORMAL HIGH (ref 65–99)
Potassium: 4.4 mmol/L (ref 3.5–5.2)
Sodium: 139 mmol/L (ref 134–144)
Total Protein: 6.6 g/dL (ref 6.0–8.5)
eGFR: 105 mL/min/{1.73_m2} (ref >59)

## 2021-03-01 LAB — HEMOGLOBIN A1C
Est. average glucose Bld gHb Est-mCnc: 166 mg/dL
Hgb A1c MFr Bld: 7.4 % — ABNORMAL HIGH (ref 4.8–5.6)

## 2021-03-01 LAB — HIV ANTIBODY (ROUTINE TESTING W REFLEX): HIV Screen 4th Generation wRfx: NONREACTIVE

## 2021-03-01 LAB — TSH: TSH: 0.825 u[IU]/mL (ref 0.450–4.500)

## 2021-03-01 NOTE — Progress Notes (Signed)
A1c is 7.4%, so we will look at diabetes medications when she comes in for her appointment on 7/28. Otherwise, labs are good.

## 2021-03-07 ENCOUNTER — Encounter: Payer: Self-pay | Admitting: Nurse Practitioner

## 2021-03-07 ENCOUNTER — Ambulatory Visit (INDEPENDENT_AMBULATORY_CARE_PROVIDER_SITE_OTHER): Payer: BC Managed Care – PPO | Admitting: Nurse Practitioner

## 2021-03-07 ENCOUNTER — Other Ambulatory Visit: Payer: Self-pay

## 2021-03-07 VITALS — BP 144/79 | HR 95 | Temp 97.6°F | Ht 66.0 in | Wt 261.0 lb

## 2021-03-07 DIAGNOSIS — Z0001 Encounter for general adult medical examination with abnormal findings: Secondary | ICD-10-CM

## 2021-03-07 DIAGNOSIS — Z139 Encounter for screening, unspecified: Secondary | ICD-10-CM

## 2021-03-07 DIAGNOSIS — I10 Essential (primary) hypertension: Secondary | ICD-10-CM | POA: Diagnosis not present

## 2021-03-07 DIAGNOSIS — E785 Hyperlipidemia, unspecified: Secondary | ICD-10-CM

## 2021-03-07 DIAGNOSIS — Z794 Long term (current) use of insulin: Secondary | ICD-10-CM

## 2021-03-07 DIAGNOSIS — E0801 Diabetes mellitus due to underlying condition with hyperosmolarity with coma: Secondary | ICD-10-CM | POA: Diagnosis not present

## 2021-03-07 MED ORDER — VALSARTAN-HYDROCHLOROTHIAZIDE 160-25 MG PO TABS: 1.0000 | ORAL_TABLET | Freq: Every day | ORAL | 3 refills | Status: DC

## 2021-03-07 NOTE — Assessment & Plan Note (Addendum)
Lab Results  Component Value Date   HGBA1C 7.4 (H) 02/28/2021   -using tresiba and metformin -on statin and ARB -she is using intermittent fasting to lower A1c, and she recently started this -recheck labs in 3 months

## 2021-03-07 NOTE — Assessment & Plan Note (Signed)
Lab Results  Component Value Date   CHOL 120 02/28/2021   HDL 57 02/28/2021   LDLCALC 48 02/28/2021   TRIG 71 02/28/2021   -goal LDL < 70, so she is at goal -no change to rosuvastatin

## 2021-03-07 NOTE — Assessment & Plan Note (Addendum)
BP Readings from Last 3 Encounters:  03/07/21 (!) 144/79  02/14/21 (!) 156/76  11/28/17 (!) 143/91   -BP slightly elevated today; elevated on home BP logs as well -INCREASE DIOVAN HCT to 160/25 from 160/12.5 -check BP daily

## 2021-03-07 NOTE — Patient Instructions (Addendum)
I increased the dose of your blood pressure medicine today. I sent it to the mail order pharmacy, so keep taking your current medication until the new one comes in. We will recheck BP in 6 weeks.  We will meet up for a lab follow-up in about 4 months. Please have fasting labs drawn 2-3 days prior to that appointment so we can discuss the results during your office visit.

## 2021-03-07 NOTE — Progress Notes (Signed)
Established Patient Office Visit  Subjective:  Patient ID: Terri [name], female    DOB: 09/05/1965  Age: 55 y.o. MRN: [id number]  CC:  Chief Complaint  Patient presents with   Annual Exam    CPE    Terri [name] presents for physical exam.  She brought her home BP logs, and SBP is consistently in the 130-150s.  She has resumed intermittent fasting to help with blood sugar.  Past Medical History:  Diagnosis Date   Arthritis    right hip and knee pain; had right ankle fusion   Diabetes mellitus without complication ([place])    Hyperlipidemia    Hypertension     Past Surgical History:  Procedure Laterality Date   ABDOMINAL HYSTERECTOMY     total   ANKLE FUSION Right    APPENDECTOMY     CHOLECYSTECTOMY N/A 03/21/2016   Procedure: LAPAROSCOPIC CHOLECYSTECTOMY;  Surgeon: Aviva Signs, MD;  Location: [facility];  Service: General;  Laterality: N/A;    History reviewed. No pertinent family history.  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  Tobacco Use   Smoking status: Never   Smokeless tobacco: Never  Vaping Use   Vaping Use: Never used  Substance and Sexual Activity   Alcohol use: No   Drug use: No   Sexual activity: Yes    Birth control/protection: Surgical  Other Topics Concern   Not on file  Social History Narrative   3 adult children; youngest is out of town   Social Determinants of Radio broadcast assistant Strain: Not on file  Food Insecurity: Not on file  Transportation Needs: Not on file  Physical Activity: Not on file  Stress: Not on file  Social Connections: Not on file  Intimate Partner Violence: Not on file    Outpatient Medications Prior to Visit  Medication Sig Dispense Refill    insulin degludec (TRESIBA FLEXTOUCH) 200 UNIT/ML FlexTouch Pen Inject 20 Units into the skin at bedtime.     metFORMIN (GLUCOPHAGE) 500 MG tablet Take 500 mg by mouth 2 (two) times daily with a meal.     rosuvastatin (CRESTOR) 10 MG tablet Take 10 mg by mouth daily.     valsartan-hydrochlorothiazide (DIOVAN-HCT) 160-12.5 MG per tablet Take 1 tablet by mouth daily.     No facility-administered medications prior to visit.    No Known Allergies  ROS Review of Systems  Constitutional: Negative.   HENT: Negative.    Eyes: Negative.   Respiratory: Negative.    Cardiovascular: Negative.   Gastrointestinal: Negative.   Endocrine: Negative.   Genitourinary: Negative.   Musculoskeletal: Negative.   Skin: Negative.   Allergic/Immunologic: Negative.   Neurological: Negative.   Hematological: Negative.   Psychiatric/Behavioral: Negative.       Objective:    Physical Exam Constitutional:      Appearance: Normal appearance. She is obese.  HENT:     Head: Normocephalic and atraumatic.     Right Ear: Tympanic membrane, ear canal and external ear normal.     Left Ear: Tympanic membrane, ear canal and external ear normal.     Nose: Nose normal.     Mouth/Throat:     Mouth:  Mucous membranes are moist.     Pharynx: Oropharynx is clear.  Eyes:     Extraocular Movements: Extraocular movements intact.     Conjunctiva/sclera: Conjunctivae normal.     Pupils: Pupils are equal, round, and reactive to light.  Cardiovascular:     Rate and Rhythm: Normal rate and regular rhythm.     Pulses: Normal pulses.     Heart sounds: Normal heart sounds.  Pulmonary:     Effort: Pulmonary effort is normal.     Breath sounds: Normal breath sounds.  Abdominal:     General: Abdomen is flat. Bowel sounds are normal.     Palpations: Abdomen is soft.  Musculoskeletal:        General: Normal range of motion.     Cervical back: Normal range of motion and neck supple.  Skin:    General: Skin is warm and dry.      Capillary Refill: Capillary refill takes less than 2 seconds.  Neurological:     General: No focal deficit present.     Mental Status: She is alert and oriented to person, place, and time.     Cranial Nerves: No cranial nerve deficit.     Sensory: No sensory deficit.     Motor: No weakness.     Coordination: Coordination normal.     Gait: Gait normal.  Psychiatric:        Mood and Affect: Mood normal.        Behavior: Behavior normal.        Thought Content: Thought content normal.        Judgment: Judgment normal.    BP (!) 144/79 (BP Location: Right Arm, Patient Position: Sitting, Cuff Size: Large)   Pulse 95   Temp 97.6 F (36.4 C) (Temporal)   Ht _0  (1.676 m)   Wt 261 lb (118.4 kg)   SpO2 95%   BMI 42.13 kg/m  Wt Readings from Last 3 Encounters:  03/07/21 261 lb (118.4 kg)  02/14/21 263 lb (119.3 kg)  11/28/17 250 lb (113.4 kg)     Health Maintenance Due  Topic Date Due   FOOT EXAM  Never done   COLONOSCOPY (Pts 45-76yr Insurance coverage will need to be confirmed)  Never done    There are no preventive care reminders to display for this patient.  Lab Results  Component Value Date   TSH 0.825 02/28/2021   Lab Results  Component Value Date   WBC 4.0 02/28/2021   HGB 11.9 02/28/2021   HCT 36.5 02/28/2021   MCV 89 02/28/2021   PLT 168 02/28/2021   Lab Results  Component Value Date   NA 139 02/28/2021   K 4.4 02/28/2021   CO2 22 02/28/2021   GLUCOSE 140 (H) 02/28/2021   BUN 18 02/28/2021   CREATININE 0.65 02/28/2021   BILITOT 0.3 02/28/2021   ALKPHOS 45 02/28/2021   AST 14 02/28/2021   ALT 17 02/28/2021   PROT 6.6 02/28/2021   ALBUMIN 4.4 02/28/2021   CALCIUM 9.5 02/28/2021   ANIONGAP 11 11/28/2017   EGFR 105 02/28/2021   Lab Results  Component Value Date   CHOL 120 02/28/2021   Lab Results  Component Value Date   HDL 57 02/28/2021   Lab Results  Component Value Date   LDLCALC 48 02/28/2021   Lab Results  Component Value Date    TRIG 71 02/28/2021   [facility] found [facility]: [facility]  Component Value Date   HGBA1C  7.4 (H) 02/28/2021      Assessment & Plan:   Problem List Items Addressed This Visit       Cardiovascular and Mediastinum   Essential hypertension    BP Readings from Last 3 Encounters:  03/07/21 (!) 144/79  02/14/21 (!) 156/76  11/28/17 (!) 143/91  -BP slightly elevated today; elevated on home BP logs as well -INCREASE DIOVAN HCT to 160/25 from 160/12.5 -check BP daily      Relevant Medications   valsartan-hydrochlorothiazide (DIOVAN-HCT) 160-25 MG tablet   Other Relevant Orders   CBC with Differential/Platelet   CMP14+EGFR     Endocrine   DM (diabetes mellitus) ([place])    Lab Results  Component Value Date   HGBA1C 7.4 (H) 02/28/2021  -using tresiba and metformin -on statin and ARB -she is using intermittent fasting to lower A1c, and she recently started this -recheck labs in 3 months      Relevant Medications   valsartan-hydrochlorothiazide (DIOVAN-HCT) 160-25 MG tablet   Other Relevant Orders   CBC with Differential/Platelet   CMP14+EGFR   Lipid Panel With LDL/HDL Ratio   Hemoglobin A1c     Other   Encounter for general adult medical examination with abnormal findings - Primary   HLD (hyperlipidemia)    Lab Results  Component Value Date   CHOL 120 02/28/2021   HDL 57 02/28/2021   LDLCALC 48 02/28/2021   TRIG 71 02/28/2021  -goal LDL < 70, so she is at goal -no change to rosuvastatin      Relevant Medications   valsartan-hydrochlorothiazide (DIOVAN-HCT) 160-25 MG tablet   Other Relevant Orders   CMP14+EGFR   Lipid Panel With LDL/HDL Ratio   Other Visit Diagnoses     Screening due       Relevant Orders   Cologuard       Meds ordered this encounter  Medications   valsartan-hydrochlorothiazide (DIOVAN-HCT) 160-25 MG tablet    Sig: Take 1 tablet by mouth daily.    Dispense:  90 tablet    Refill:  3     Follow-up: Return in about 6 weeks  (around 04/18/2021) for BP check.    Noreene Larsson, NP

## 2021-03-08 ENCOUNTER — Telehealth: Payer: Self-pay

## 2021-03-08 ENCOUNTER — Other Ambulatory Visit: Payer: Self-pay

## 2021-03-08 DIAGNOSIS — I10 Essential (primary) hypertension: Secondary | ICD-10-CM

## 2021-03-08 MED ORDER — VALSARTAN-HYDROCHLOROTHIAZIDE 160-25 MG PO TABS
1.0000 | ORAL_TABLET | Freq: Every day | ORAL | 3 refills | Status: DC
Start: 2021-03-08 — End: 2021-07-12

## 2021-03-08 NOTE — Telephone Encounter (Signed)
Patient called need this medicine  valsartan-hydrochlorothiazide (DIOVAN-HCT) 160-25 MG tablet to go to  Pharmacy: [facility] [place], [place]

## 2021-03-08 NOTE — Telephone Encounter (Signed)
Rx sent 

## 2021-03-14 ENCOUNTER — Other Ambulatory Visit (HOSPITAL_COMMUNITY): Payer: Self-pay | Admitting: Nurse Practitioner

## 2021-03-14 DIAGNOSIS — Z1231 Encounter for screening mammogram for malignant neoplasm of breast: Secondary | ICD-10-CM

## 2021-03-15 LAB — COLOGUARD

## 2021-03-18 NOTE — Progress Notes (Signed)
Cologuard needs to be completed again. The lab should call with further instructions.

## 2021-03-25 ENCOUNTER — Ambulatory Visit (HOSPITAL_COMMUNITY)
Admission: RE | Admit: 2021-03-25 | Discharge: 2021-03-25 | Disposition: A | Discharge status: Home / Self Care | Payer: BC Managed Care – PPO | Source: Ambulatory Visit | Attending: Nurse Practitioner | Admitting: Nurse Practitioner

## 2021-03-25 ENCOUNTER — Other Ambulatory Visit: Payer: Self-pay

## 2021-03-25 DIAGNOSIS — Z1231 Encounter for screening mammogram for malignant neoplasm of breast: Secondary | ICD-10-CM | POA: Diagnosis present

## 2021-03-26 NOTE — Progress Notes (Signed)
Mammogram is negative, so she can repeat imaging in a year.

## 2021-03-29 LAB — COLOGUARD: Cologuard: NEGATIVE

## 2021-04-01 NOTE — Progress Notes (Signed)
Cologuard is negative.

## 2021-04-18 ENCOUNTER — Ambulatory Visit: Payer: BC Managed Care – PPO | Admitting: Nurse Practitioner

## 2021-04-22 ENCOUNTER — Encounter: Payer: Self-pay | Admitting: Nurse Practitioner

## 2021-04-22 ENCOUNTER — Other Ambulatory Visit: Payer: Self-pay

## 2021-04-22 ENCOUNTER — Ambulatory Visit (INDEPENDENT_AMBULATORY_CARE_PROVIDER_SITE_OTHER): Payer: BC Managed Care – PPO | Admitting: Nurse Practitioner

## 2021-04-22 VITALS — BP 146/83 | HR 88 | Temp 98.2°F | Ht 66.0 in | Wt 262.0 lb

## 2021-04-22 DIAGNOSIS — I10 Essential (primary) hypertension: Secondary | ICD-10-CM

## 2021-04-22 DIAGNOSIS — Z23 Encounter for immunization: Secondary | ICD-10-CM

## 2021-04-22 MED ORDER — AMLODIPINE BESYLATE 5 MG PO TABS: 5.0000 mg | ORAL_TABLET | Freq: Every day | ORAL | 3 refills | Status: DC

## 2021-04-22 NOTE — Progress Notes (Signed)
Acute Office Visit  Subjective:    Patient ID: Terri [name], female    DOB: February 26, 1966, 55 y.o.   MRN: [id number]  Chief Complaint  Patient presents with   Hypertension    Follow up    Hypertension  Patient is in today for BP check. Her blood pressure logs for the last month show BP usually in the low 140s/80-90s.   Past Medical History:  Diagnosis Date   Arthritis    right hip and knee pain; had right ankle fusion   Diabetes mellitus without complication ([place])    Hyperlipidemia    Hypertension     Past Surgical History:  Procedure Laterality Date   ABDOMINAL HYSTERECTOMY     total   ANKLE FUSION Right    APPENDECTOMY     CHOLECYSTECTOMY N/A 03/21/2016   Procedure: LAPAROSCOPIC CHOLECYSTECTOMY;  Surgeon: Aviva Signs, MD;  Location: [facility];  Service: General;  Laterality: N/A;    History reviewed. No pertinent family history.  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  Tobacco Use   Smoking status: Never   Smokeless tobacco: Never  Vaping Use   Vaping Use: Never used  Substance and Sexual Activity   Alcohol use: No   Drug use: No   Sexual activity: Yes    Birth control/protection: Surgical  Other Topics Concern   Not on file  Social History Narrative   3 adult children; youngest is out of town   Social Determinants of Radio broadcast assistant Strain: Not on file  Food Insecurity: Not on file  Transportation Needs: Not on file  Physical Activity: Not on file  Stress: Not on file  Social Connections: Not on file  Intimate Partner Violence: Not on file    Outpatient Medications Prior to Visit  Medication Sig Dispense Refill   insulin degludec (TRESIBA FLEXTOUCH) 200 UNIT/ML FlexTouch Pen Inject  20 Units into the skin at bedtime.     metFORMIN (GLUCOPHAGE) 500 MG tablet Take 500 mg by mouth 2 (two) times daily with a meal.     rosuvastatin (CRESTOR) 10 MG tablet Take 10 mg by mouth daily.     valsartan-hydrochlorothiazide (DIOVAN-HCT) 160-25 MG tablet Take 1 tablet by mouth daily. 90 tablet 3   No facility-administered medications prior to visit.    No Known Allergies  Review of Systems  Constitutional: Negative.   Respiratory: Negative.    Cardiovascular: Negative.   Psychiatric/Behavioral: Negative.        Objective:    Physical Exam Constitutional:      Appearance: Normal appearance. She is obese.  Cardiovascular:     Rate and Rhythm: Normal rate and regular rhythm.     Pulses: Normal pulses.     Heart sounds: Normal heart sounds.  Pulmonary:     Effort: Pulmonary effort is normal.     Breath sounds: Normal breath sounds.  Neurological:     Mental Status: She is alert.  Psychiatric:        Mood and Affect: Mood normal.        Behavior: Behavior normal.        Thought Content: Thought content normal.        Judgment:  Judgment normal.    BP (!) 146/83 (BP Location: Right Arm, Patient Position: Sitting, Cuff Size: Large)   Pulse 88   Temp 98.2 F (36.8 C) (Oral)   Ht $R'5\' 6"'Jw$  (1.676 m)   Wt 262 lb (118.8 kg)   SpO2 94%   BMI 42.29 kg/m  Wt Readings from Last 3 Encounters:  04/22/21 262 lb (118.8 kg)  03/07/21 261 lb (118.4 kg)  02/14/21 263 lb (119.3 kg)    Health Maintenance Due  Topic Date Due   FOOT EXAM  Never done   COLONOSCOPY (Pts 45-3yrs Insurance coverage will need to be confirmed)  Never done   INFLUENZA VACCINE  Never done    There are no preventive care reminders to display for this patient.   Lab Results  Component Value Date   TSH 0.825 02/28/2021   Lab Results  Component Value Date   WBC 4.0 02/28/2021   HGB 11.9 02/28/2021   HCT 36.5 02/28/2021   MCV 89 02/28/2021   PLT 168 02/28/2021   Lab Results  Component Value  Date   NA 139 02/28/2021   K 4.4 02/28/2021   CO2 22 02/28/2021   GLUCOSE 140 (H) 02/28/2021   BUN 18 02/28/2021   CREATININE 0.65 02/28/2021   BILITOT 0.3 02/28/2021   ALKPHOS 45 02/28/2021   AST 14 02/28/2021   ALT 17 02/28/2021   PROT 6.6 02/28/2021   ALBUMIN 4.4 02/28/2021   CALCIUM 9.5 02/28/2021   ANIONGAP 11 11/28/2017   EGFR 105 02/28/2021   Lab Results  Component Value Date   CHOL 120 02/28/2021   Lab Results  Component Value Date   HDL 57 02/28/2021   Lab Results  Component Value Date   LDLCALC 48 02/28/2021   Lab Results  Component Value Date   TRIG 71 02/28/2021   No results found for: [facility] Lab Results  Component Value Date   HGBA1C 7.4 (H) 02/28/2021       Assessment & Plan:   Problem List Items Addressed This Visit       Cardiovascular and Mediastinum   Essential hypertension    -BP slightly elevated despite increasing Diovan BP Readings from Last 3 Encounters:  04/22/21 (!) 146/83  03/07/21 (!) 144/79  02/14/21 (!) 156/76   -Rx. Amlodipine -recheck in 1 month      Relevant Medications   amLODipine (NORVASC) 5 MG tablet     Meds ordered this encounter  Medications   amLODipine (NORVASC) 5 MG tablet    Sig: Take 1 tablet (5 mg total) by mouth daily.    Dispense:  90 tablet    Refill:  [place], NP

## 2021-04-22 NOTE — Assessment & Plan Note (Signed)
-  BP slightly elevated despite increasing Diovan BP Readings from Last 3 Encounters:  04/22/21 (!) 146/83  03/07/21 (!) 144/79  02/14/21 (!) 156/76   -Rx. Amlodipine -recheck in 1 month

## 2021-04-23 ENCOUNTER — Telehealth: Payer: Self-pay

## 2021-04-23 ENCOUNTER — Other Ambulatory Visit: Payer: Self-pay

## 2021-04-23 DIAGNOSIS — I10 Essential (primary) hypertension: Secondary | ICD-10-CM

## 2021-04-23 DIAGNOSIS — Z794 Long term (current) use of insulin: Secondary | ICD-10-CM

## 2021-04-23 DIAGNOSIS — E0801 Diabetes mellitus due to underlying condition with hyperosmolarity with coma: Secondary | ICD-10-CM

## 2021-04-23 DIAGNOSIS — E785 Hyperlipidemia, unspecified: Secondary | ICD-10-CM

## 2021-04-23 MED ORDER — AMLODIPINE BESYLATE 5 MG PO TABS: 5.0000 mg | ORAL_TABLET | Freq: Every day | ORAL | 3 refills | Status: DC

## 2021-04-23 MED ORDER — ROSUVASTATIN CALCIUM 10 MG PO TABS: 10.0000 mg | ORAL_TABLET | Freq: Every day | ORAL | 1 refills | Status: DC

## 2021-04-23 MED ORDER — METFORMIN HCL 500 MG PO TABS: 500.0000 mg | ORAL_TABLET | Freq: Two times a day (BID) | ORAL | 1 refills | Status: DC

## 2021-04-23 NOTE — Telephone Encounter (Signed)
Patient called she states [facility] and it should have went to [facility] ph# [phone]

## 2021-04-23 NOTE — Telephone Encounter (Signed)
Rx's re-sent to [facility].

## 2021-05-23 ENCOUNTER — Telehealth: Payer: Self-pay | Admitting: Nurse Practitioner

## 2021-05-23 ENCOUNTER — Other Ambulatory Visit: Payer: Self-pay

## 2021-05-23 DIAGNOSIS — E1165 Type 2 diabetes mellitus with hyperglycemia: Secondary | ICD-10-CM

## 2021-05-23 MED ORDER — GLUCOSE BLOOD VI STRP: ORAL_STRIP | 12 refills | Status: DC

## 2021-05-23 MED ORDER — TRESIBA FLEXTOUCH 200 UNIT/ML ~~LOC~~ SOPN: 20.0000 [IU] | PEN_INJECTOR | Freq: Every day | SUBCUTANEOUS | 1 refills | Status: DC

## 2021-05-23 NOTE — Telephone Encounter (Signed)
Pt came in office and this was taken care of.  Ordered test strips and insulin at [facility]

## 2021-05-23 NOTE — Telephone Encounter (Signed)
Patient called in for autoscript from [facility]

## 2021-06-24 ENCOUNTER — Ambulatory Visit: Payer: BC Managed Care – PPO | Admitting: Nurse Practitioner

## 2021-06-28 LAB — CBC WITH DIFFERENTIAL/PLATELET
Basophils Absolute: 0 10*3/uL (ref 0.0–0.2)
Basos: 0 %
EOS (ABSOLUTE): 0.1 10*3/uL (ref 0.0–0.4)
Eos: 3 %
Hematocrit: 36 % (ref 34.0–46.6)
Hemoglobin: 11.8 g/dL (ref 11.1–15.9)
Immature Grans (Abs): 0 10*3/uL (ref 0.0–0.1)
Immature Granulocytes: 0 %
Lymphocytes Absolute: 2.5 10*3/uL (ref 0.7–3.1)
Lymphs: 45 %
MCH: 29.5 pg (ref 26.6–33.0)
MCHC: 32.8 g/dL (ref 31.5–35.7)
MCV: 90 fL (ref 79–97)
Monocytes Absolute: 0.6 10*3/uL (ref 0.1–0.9)
Monocytes: 11 %
Neutrophils Absolute: 2.3 10*3/uL (ref 1.4–7.0)
Neutrophils: 41 %
Platelets: 225 10*3/uL (ref 150–450)
RBC: 4 x10E6/uL (ref 3.77–5.28)
RDW: 12.9 % (ref 11.7–15.4)
WBC: 5.4 10*3/uL (ref 3.4–10.8)

## 2021-06-28 LAB — CMP14+EGFR
ALT: 21 IU/L (ref 0–32)
AST: 33 IU/L (ref 0–40)
Albumin/Globulin Ratio: 1.5 (ref 1.2–2.2)
Albumin: 4.4 g/dL (ref 3.8–4.9)
Alkaline Phosphatase: 57 IU/L (ref 44–121)
BUN/Creatinine Ratio: 16 (ref 9–23)
BUN: 12 mg/dL (ref 6–24)
Bilirubin Total: <0.2 mg/dL (ref 0.0–1.2)
CO2: 23 mmol/L (ref 20–29)
Calcium: 9.3 mg/dL (ref 8.7–10.2)
Chloride: 101 mmol/L (ref 96–106)
Creatinine, Ser: 0.73 mg/dL (ref 0.57–1.00)
Globulin, Total: 3 g/dL (ref 1.5–4.5)
Glucose: 149 mg/dL — ABNORMAL HIGH (ref 70–99)
Potassium: 4.8 mmol/L (ref 3.5–5.2)
Sodium: 138 mmol/L (ref 134–144)
Total Protein: 7.4 g/dL (ref 6.0–8.5)
eGFR: 98 mL/min/{1.73_m2} (ref >59)

## 2021-06-28 LAB — LIPID PANEL WITH LDL/HDL RATIO
Cholesterol, Total: 142 mg/dL (ref 100–199)
HDL: 56 mg/dL (ref >39)
LDL Chol Calc (NIH): 50 mg/dL (ref 0–99)
LDL/HDL Ratio: 0.9 ratio (ref 0.0–3.2)
Triglycerides: 226 mg/dL — ABNORMAL HIGH (ref 0–149)
VLDL Cholesterol Cal: 36 mg/dL (ref 5–40)

## 2021-06-28 LAB — HEMOGLOBIN A1C
Est. average glucose Bld gHb Est-mCnc: 163 mg/dL
Hgb A1c MFr Bld: 7.3 % — ABNORMAL HIGH (ref 4.8–5.6)

## 2021-07-08 ENCOUNTER — Ambulatory Visit: Payer: BC Managed Care – PPO | Admitting: Nurse Practitioner

## 2021-07-12 ENCOUNTER — Ambulatory Visit: Payer: BC Managed Care – PPO | Admitting: Nurse Practitioner

## 2021-07-12 ENCOUNTER — Other Ambulatory Visit: Payer: Self-pay

## 2021-07-12 ENCOUNTER — Ambulatory Visit (INDEPENDENT_AMBULATORY_CARE_PROVIDER_SITE_OTHER): Payer: BC Managed Care – PPO | Admitting: Nurse Practitioner

## 2021-07-12 ENCOUNTER — Encounter: Payer: Self-pay | Admitting: Nurse Practitioner

## 2021-07-12 VITALS — BP 159/77 | HR 87 | Resp 18 | Ht 66.0 in | Wt 263.0 lb

## 2021-07-12 DIAGNOSIS — I10 Essential (primary) hypertension: Secondary | ICD-10-CM | POA: Diagnosis not present

## 2021-07-12 DIAGNOSIS — Z794 Long term (current) use of insulin: Secondary | ICD-10-CM

## 2021-07-12 DIAGNOSIS — E785 Hyperlipidemia, unspecified: Secondary | ICD-10-CM

## 2021-07-12 DIAGNOSIS — E0801 Diabetes mellitus due to underlying condition with hyperosmolarity with coma: Secondary | ICD-10-CM | POA: Diagnosis not present

## 2021-07-12 DIAGNOSIS — M1990 Unspecified osteoarthritis, unspecified site: Secondary | ICD-10-CM

## 2021-07-12 DIAGNOSIS — Z6841 Body Mass Index (BMI) 40.0 and over, adult: Secondary | ICD-10-CM | POA: Insufficient documentation

## 2021-07-12 MED ORDER — VALSARTAN-HYDROCHLOROTHIAZIDE 320-25 MG PO TABS: 1.0000 | ORAL_TABLET | Freq: Every day | ORAL | 3 refills | Status: DC

## 2021-07-12 MED ORDER — METFORMIN HCL 500 MG PO TABS: 1000.0000 mg | ORAL_TABLET | Freq: Two times a day (BID) | ORAL | 1 refills | Status: DC

## 2021-07-12 NOTE — Assessment & Plan Note (Addendum)
Wt Readings from Last 3 Encounters:  07/12/21 263 lb (119.3 kg)  04/22/21 262 lb (118.8 kg)  03/07/21 261 lb (118.4 kg)  engage in regular vigorous exercise 30 minutes 5 times a week, portion control, hearthealthy low carb diet. Doing keto diet

## 2021-07-12 NOTE — Progress Notes (Signed)
   Terri [name]     MRN: [id number]      DOB: September 13, 1965   HPI Terri [name] is here for follow up and re-evaluation of chronic medical conditions, medication management and review of any available recent lab and radiology data.  Preventive health is updated, specifically  Cancer screening and Immunization.   Questions or concerns regarding consultations or procedures which the PT has had in the interim are  addressed. The PT denies any adverse reactions to current medications since the last visit.  There are no new concerns.  There are no specific complaints   Monitors BP at home, BP at home ranged from 135/81 to 149/82. No hypoglycemic event at home, monitors blood sugar regularly.   Had negative colo guard test this year.had her feet exam last visit.   ROS Denies recent fever or chills. Denies sinus pressure, nasal congestion, ear pain or sore throat. Denies chest congestion, productive cough or wheezing. Denies chest pains, palpitations and leg swelling Denies abdominal pain, nausea, vomiting,diarrhea or constipation.   Denies dysuria, frequency, hesitancy or incontinence. Has arthritis in the R hip R knee ,left shoulder. No swelling and limitation in mobility. Denies headaches, seizures, numbness, sometimes tingling in the left shoulder. Denies depression, anxiety or insomnia. Denies skin break down or rash.   PE  BP (!) 159/77   Pulse 87   Resp 18   Ht 5\' 6"  (1.676 m)   Wt 263 lb (119.3 kg)   SpO2 95%   BMI 42.45 kg/m   Patient alert and oriented and in no cardiopulmonary distress.  HEENT: No facial asymmetry, EOMI,     Neck supple .  Chest: Clear to auscultation bilaterally.  CVS: S1, S2 no murmurs, no S3.Regular rate.  ABD: Soft non tender.   Ext: No edema  MS: Adequate ROM spine, shoulders, hips and knees.  Skin: Intact, no ulcerations or rash noted.  Psych: Good eye contact, normal affect. Memory intact not anxious or depressed  appearing.  CNS: CN 2-12 intact, power,  normal throughout.no focal deficits noted.   Assessment & Plan

## 2021-07-12 NOTE — Assessment & Plan Note (Signed)
BP Readings from Last 3 Encounters:  07/12/21 (!) 159/77  04/22/21 (!) 146/83  03/07/21 (!) 144/79  avoid salt.  Start Diovan -hct320-25mg  daily Continue amlodipine 5 mg daily

## 2021-07-12 NOTE — Assessment & Plan Note (Signed)
Continue crestor 10mg  daily.  Lipid panel next visit.

## 2021-07-12 NOTE — Assessment & Plan Note (Addendum)
Lab Results  Component Value Date   HGBA1C 7.3 (H) 06/27/2021  A1C not at goal start metfromin 1000mg  BID Continue tresiba 20 units daily at bedtime.  She is doing intermittent fasting and keto diet Avoid sweets/ sugar, do regular vigorous exercise 30 minutes 5 times a week.

## 2021-07-12 NOTE — Patient Instructions (Signed)
Please get your labs done 3-5 days before your next visit. ° ° °It is important that you exercise regularly at least 30 minutes 5 times a week.  °Think about what you will eat, plan ahead. °Choose " clean, green, fresh or frozen" over canned, processed or packaged foods which are more sugary, salty and fatty. °70 to 75% of food eaten should be vegetables and fruit. °Three meals at set times with snacks allowed between meals, but they must be fruit or vegetables. °Aim to eat over a 12 hour period , example 7 am to 7 pm, and STOP after  your last meal of the day. °Drink water,generally about 64 ounces per day, no other drink is as healthy. Fruit juice is best enjoyed in a healthy way, by EATING the fruit. ° °Thanks for choosing [facility], we consider it a privelige to serve you.  °

## 2021-07-13 DIAGNOSIS — M1990 Unspecified osteoarthritis, unspecified site: Secondary | ICD-10-CM | POA: Insufficient documentation

## 2021-07-13 NOTE — Assessment & Plan Note (Signed)
Uses tylenol as needed.

## 2021-07-17 NOTE — Progress Notes (Signed)
Labs are stable, and you will see her in April.

## 2021-08-12 DIAGNOSIS — Z0289 Encounter for other administrative examinations: Secondary | ICD-10-CM

## 2021-08-22 ENCOUNTER — Other Ambulatory Visit: Payer: Self-pay

## 2021-08-22 ENCOUNTER — Encounter (INDEPENDENT_AMBULATORY_CARE_PROVIDER_SITE_OTHER): Payer: Self-pay | Admitting: Bariatrics

## 2021-08-22 ENCOUNTER — Ambulatory Visit (INDEPENDENT_AMBULATORY_CARE_PROVIDER_SITE_OTHER): Payer: BC Managed Care – PPO | Admitting: Bariatrics

## 2021-08-22 VITALS — BP 143/89 | HR 82 | Temp 98.1°F | Ht 66.0 in | Wt 255.0 lb

## 2021-08-22 DIAGNOSIS — E559 Vitamin D deficiency, unspecified: Secondary | ICD-10-CM

## 2021-08-22 DIAGNOSIS — E538 Deficiency of other specified B group vitamins: Secondary | ICD-10-CM

## 2021-08-22 DIAGNOSIS — E0801 Diabetes mellitus due to underlying condition with hyperosmolarity with coma: Secondary | ICD-10-CM

## 2021-08-22 DIAGNOSIS — R5383 Other fatigue: Secondary | ICD-10-CM

## 2021-08-22 DIAGNOSIS — E785 Hyperlipidemia, unspecified: Secondary | ICD-10-CM

## 2021-08-22 DIAGNOSIS — Z1331 Encounter for screening for depression: Secondary | ICD-10-CM | POA: Diagnosis not present

## 2021-08-22 DIAGNOSIS — I10 Essential (primary) hypertension: Secondary | ICD-10-CM | POA: Diagnosis not present

## 2021-08-22 DIAGNOSIS — R0602 Shortness of breath: Secondary | ICD-10-CM | POA: Diagnosis not present

## 2021-08-22 DIAGNOSIS — Z6841 Body Mass Index (BMI) 40.0 and over, adult: Secondary | ICD-10-CM

## 2021-08-22 DIAGNOSIS — Z794 Long term (current) use of insulin: Secondary | ICD-10-CM

## 2021-08-22 NOTE — Progress Notes (Signed)
Chief Complaint:   OBESITY Terri PewBarbara M [name] (MR# [id number]) is a 56 y.o. female who presents for evaluation and treatment of obesity and related comorbidities. Current BMI is Body mass index is 41.16 kg/m. Terri [name] has been struggling with her weight for many years and has been unsuccessful in either losing weight, maintaining weight loss, or reaching her healthy weight goal.  Terri [name] does like to cook and notes that she does not like a lot of preparation.  Terri [name] is currently in the action stage of change and ready to dedicate time achieving and maintaining a healthier weight. Terri [name] is interested in becoming our patient and working on intensive lifestyle modifications including (but not limited to) diet and exercise for weight loss.  Terri [name] habits were reviewed today and are as follows: she thinks her family will eat healthier with her, her desired weight loss is 55 pounds, she has been heavy most of her life, she started gaining weight after the birth of 1st child, her heaviest weight ever was 280 pounds, she is a picky eater and doesn't like to eat healthier foods, she has significant food cravings issues, she snacks frequently in the evenings, she wakes up frequently in the middle of the night to eat, she skips meals frequently, she frequently makes poor food choices, she frequently eats larger portions than normal, she has binge eating behaviors, and she struggles with emotional eating.  Depression Screen Nikkia's Food and Mood (modified PHQ-9) score was 5.  Depression screen PHQ 2/9 08/22/2021  Decreased Interest 1  Down, Depressed, Hopeless 1  PHQ - 2 Score 2  Altered sleeping 0  Tired, decreased energy 0  Change in appetite 1  Feeling bad or failure about yourself  1  Trouble concentrating 1  Moving slowly or fidgety/restless 0  Suicidal thoughts 0  PHQ-9 Score 5  Difficult doing work/chores Not difficult at all   Subjective:   1. Other fatigue Terri [name] reports  daytime somnolence and admits to waking up still tired. Patent has a history of symptoms of morning fatigue. Terri [name] generally gets 6 hours of sleep per night, and states that she has difficulty falling asleep. Snoring is present. Apneic episodes is not present. Epworth Sleepiness Score is 6.   2. SOB (shortness of breath) on exertion Terri [name] notes increasing shortness of breath with exercising and seems to be worsening over time with weight gain. She notes getting out of breath sooner with activity than she used to. This has not gotten worse recently. Terri [name] denies shortness of breath at rest or orthopnea.   3. Essential hypertension Terri [name] is currently taking Valsartan/HCTZ and amlodipine.  4. Hyperlipidemia, unspecified hyperlipidemia type Terri [name] is taking Rosuvastatin. She denies sides effects.  5. 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 Her last A1C was 7.2. Her Post prandial was in the range 130's-160's. Her fasting blood sugar was in the range 102-139.  6. Vitamin D deficiency Terri [name] is currently not on Vitamin D.  7. Vitamin B 12 deficiency Terri [name] is not taking Vitamin B currently.  Assessment/Plan:   1. Other fatigue Terri [name] does feel that her weight is causing her energy to be lower than it should be. Fatigue may be related to obesity, depression or many other causes. Labs will be ordered, and in the meanwhile, Terri [name] will focus on self care including making healthy food choices, increasing physical activity and focusing on stress reduction.  -  EKG 12-Lead - TSH+T4F+T3Free  2. SOB (shortness of breath) on exertion Terri [name] does feel that she gets out of breath more easily that she used to when she exercises. Terri [name] shortness of breath appears to be obesity related and exercise induced. She has agreed to work on weight loss and gradually  increase exercise to treat her exercise induced shortness of breath. Will continue to monitor closely.  - TSH+T4F+T3Free  3. Essential hypertension Terri [name] will continue taking blood pressure at home. She states her blood pressure is controlled and improving. She is working on healthy weight loss and exercise to improve blood pressure control. We will watch for signs of hypotension as she continues her lifestyle modifications.  4. Hyperlipidemia, unspecified hyperlipidemia type Cardiovascular risk and specific lipid/LDL goals reviewed.  Terri [name] will continue her medications. We discussed several lifestyle modifications today and Terri [name] will continue to work on diet, exercise and weight loss efforts. Orders and follow up as documented in patient record.   Counseling Intensive lifestyle modifications are the first line treatment for this issue. Dietary changes: Increase soluble fiber. Decrease simple carbohydrates. Exercise changes: Moderate to vigorous-intensity aerobic activity 150 minutes per week if tolerated. Lipid-lowering medications: see documented in medical record.  5. 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 She is currently on Tresiba and will start the same dose. Her fasting blood sugar, 1-2 post prandial or low. Good blood sugar control is important to decrease the likelihood of diabetic complications such as nephropathy, neuropathy, limb loss, blindness, coronary artery disease, and death. Intensive lifestyle modification including diet, exercise and weight loss are the first line of treatment for diabetes.   6. Vitamin D deficiency Low Vitamin D level contributes to fatigue and are associated with obesity, breast, and colon cancer. We will check Vitamin D today and Terri [name] will follow-up for routine testing of Vitamin D, at least 2-3 times per year to avoid over-replacement.  -  VITAMIN D 25 Hydroxy (Vit-D Deficiency, Fractures)  7. Vitamin B 12 deficiency The diagnosis was reviewed with the patient. Counseling provided today, see below. We will check Vitamin B today. We will continue to monitor. Orders and follow up as documented in patient record.  Counseling The body needs vitamin B12: to make red blood cells; to make DNA; and to help the nerves work properly so they can carry messages from the brain to the body.  The main causes of vitamin B12 deficiency include dietary deficiency, digestive diseases, pernicious anemia, and having a surgery in which part of the stomach or small intestine is removed.  Certain medicines can make it harder for the body to absorb vitamin B12. These medicines include: heartburn medications; some antibiotics; some medications used to treat diabetes, gout, and high cholesterol.  In some cases, there are no symptoms of this condition. If the condition leads to anemia or nerve damage, various symptoms can occur, such as weakness or fatigue, shortness of breath, and numbness or tingling in your hands and feet.   Treatment:  May include taking vitamin B12 supplements.  Avoid alcohol.  Eat lots of healthy foods that contain vitamin B12: Beef, pork, chicken, [place], and organ meats, such as liver.  Seafood: This includes clams, rainbow trout, salmon, tuna, and haddock. Eggs.  Cereal and dairy products that are fortified: This means that vitamin B12 has been added to the food.   - Vitamin B12  8. Depression screen Terri [name] had a negative  depression screening. Depression is commonly associated with obesity and often results in emotional eating behaviors. We will monitor this closely and work on CBT to help improve the non-hunger eating patterns. Referral to Psychology may be required if no improvement is seen as she continues in our clinic.   9. Class 3 severe obesity with serious comorbidity and body mass index (BMI) of 40.0 to 44.9 in adult,  unspecified obesity type (HCC) Terri [name] is currently in the action stage of change and her goal is to continue with weight loss efforts. I recommend Terri [name] begin the structured treatment plan as follows:  She has agreed to the Category 2 Plan.  Terri [name] will continue meal planning. She will decrease protein size. We reviewed labs from 06/27/2021, CMP, Lipids, A1C and glucose.  Exercise goals: No exercise has been prescribed at this time.   Behavioral modification strategies: increasing lean protein intake, decreasing simple carbohydrates, increasing vegetables, increasing water intake, decreasing eating out, no skipping meals, meal planning and cooking strategies, keeping healthy foods in the home, and planning for success.  She was informed of the importance of frequent follow-up visits to maximize her success with intensive lifestyle modifications for her multiple health conditions. She was informed we would discuss her lab results at her next visit unless there is a critical issue that needs to be addressed sooner. Terri [name] agreed to keep her next visit at the agreed upon time to discuss these results.  Objective:   Blood pressure (!) 143/89, pulse 82, temperature 98.1 F (36.7 C), height 5\' 6"  (1.676 m), weight 255 lb (115.7 kg), SpO2 98 %. Body mass index is 41.16 kg/m.  EKG: Normal sinus rhythm, rate 85 bpm.  Indirect Calorimeter completed today shows a VO2 of 223 and a REE of 1541.  Her calculated basal metabolic rate is thus her basal metabolic rate is better than expected.  General: Cooperative, alert, well developed, in no acute distress. HEENT: Conjunctivae and lids unremarkable. Cardiovascular: Regular rhythm.  Lungs: Normal work of breathing. Neurologic: No focal deficits.   Lab Results  Component Value Date   CREATININE 0.73 06/27/2021   BUN 12 06/27/2021   NA 138 06/27/2021   K 4.8 06/27/2021   CL 101 06/27/2021   CO2 23 06/27/2021   Lab Results  Component  Value Date   ALT 21 06/27/2021   AST 33 06/27/2021   ALKPHOS 57 06/27/2021   BILITOT <0.2 06/27/2021   Lab Results  Component Value Date   HGBA1C 7.3 (H) 06/27/2021   HGBA1C 7.4 (H) 02/28/2021   No results found for: INSULIN Lab Results  Component Value Date   TSH 0.825 02/28/2021   Lab Results  Component Value Date   CHOL 142 06/27/2021   HDL 56 06/27/2021   LDLCALC 50 06/27/2021   TRIG 226 (H) 06/27/2021   Lab Results  Component Value Date   WBC 5.4 06/27/2021   HGB 11.8 06/27/2021   HCT 36.0 06/27/2021   MCV 90 06/27/2021   PLT 225 06/27/2021   No results found for: IRON, TIBC, FERRITIN  Attestation Statements:   Reviewed by clinician on day of visit: allergies, medications, problem list, medical history, surgical history, family history, social history, and previous encounter notes.   I, 06/29/2021, RMA, am acting as [employer] for [employer] manager, DO.  I have reviewed the above documentation for accuracy and completeness, and I agree with the above. [employer], DO

## 2021-08-23 LAB — VITAMIN D 25 HYDROXY (VIT D DEFICIENCY, FRACTURES): Vit D, 25-Hydroxy: 42.6 ng/mL (ref 30.0–100.0)

## 2021-08-23 LAB — TSH+T4F+T3FREE
Free T4: 1.2 ng/dL (ref 0.82–1.77)
T3, Free: 3.3 pg/mL (ref 2.0–4.4)
TSH: 0.909 u[IU]/mL (ref 0.450–4.500)

## 2021-08-23 LAB — VITAMIN B12: Vitamin B-12: 378 pg/mL (ref 232–1245)

## 2021-08-26 ENCOUNTER — Encounter (INDEPENDENT_AMBULATORY_CARE_PROVIDER_SITE_OTHER): Payer: Self-pay | Admitting: Bariatrics

## 2021-09-05 ENCOUNTER — Other Ambulatory Visit: Payer: Self-pay

## 2021-09-05 ENCOUNTER — Ambulatory Visit (INDEPENDENT_AMBULATORY_CARE_PROVIDER_SITE_OTHER): Payer: BC Managed Care – PPO | Admitting: Bariatrics

## 2021-09-05 ENCOUNTER — Encounter (INDEPENDENT_AMBULATORY_CARE_PROVIDER_SITE_OTHER): Payer: Self-pay | Admitting: Bariatrics

## 2021-09-05 VITALS — BP 135/85 | HR 82 | Temp 97.9°F | Ht 66.0 in | Wt 253.0 lb

## 2021-09-05 DIAGNOSIS — E538 Deficiency of other specified B group vitamins: Secondary | ICD-10-CM

## 2021-09-05 DIAGNOSIS — E559 Vitamin D deficiency, unspecified: Secondary | ICD-10-CM | POA: Diagnosis not present

## 2021-09-05 DIAGNOSIS — E0801 Diabetes mellitus due to underlying condition with hyperosmolarity with coma: Secondary | ICD-10-CM

## 2021-09-05 DIAGNOSIS — E1169 Type 2 diabetes mellitus with other specified complication: Secondary | ICD-10-CM

## 2021-09-05 DIAGNOSIS — E669 Obesity, unspecified: Secondary | ICD-10-CM | POA: Diagnosis not present

## 2021-09-05 DIAGNOSIS — Z6841 Body Mass Index (BMI) 40.0 and over, adult: Secondary | ICD-10-CM

## 2021-09-05 DIAGNOSIS — Z7984 Long term (current) use of oral hypoglycemic drugs: Secondary | ICD-10-CM

## 2021-09-05 DIAGNOSIS — Z794 Long term (current) use of insulin: Secondary | ICD-10-CM

## 2021-09-05 NOTE — Progress Notes (Signed)
Chief Complaint:   OBESITY Terri [name] is here to discuss her progress with her obesity treatment plan along with follow-up of her obesity related diagnoses. Terri [name] is on the Category 2 Plan and states she is following her eating plan approximately 98% of the time. Terri [name] states she is walking for 15 minutes 2 times per week.  Today's visit was #: 2 Starting weight: 255 lbs Starting date: 08/22/2021 Today's weight: 253 lbs Today's date: 09/05/2021 Total lbs lost to date: 2 lbs Total lbs lost since last in-office visit: 2 lbs  Interim History: Terri [name] is down 2 lbs since her first visit. She states that she did not struggle.  Subjective:   1. 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 Her last A1C was 7.3. Her fasting blood sugars are improving. Before bed her fasting blood sugars are in the range of 123-146.  2. Vitamin D deficiency Terri [name] is currently not taking Vitamin D. Her last Vitamin D level was 42.6.  3. Vitamin B 12 deficiency Terri [name] has low/normal Vitamin B 12 378.  Assessment/Plan:   1. 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 She will continue to check her blood sugar. Good blood sugar control is important to decrease the likelihood of diabetic complications such as nephropathy, neuropathy, limb loss, blindness, coronary artery disease, and death. Intensive lifestyle modification including diet, exercise and weight loss are the first line of treatment for diabetes.   2. Vitamin D deficiency Low Vitamin D level contributes to fatigue and are associated with obesity, breast, and colon cancer. Terri [name] will resume taking Vitamin D and she will follow-up for routine testing of Vitamin D, at least 2-3 times per year to  avoid over-replacement.  3. Vitamin B 12 deficiency The diagnosis was reviewed with the patient. Terri [name] will start B 12 1,000 mg once daily.Counseling provided today, see below. We will continue to monitor. Orders and follow up as documented in patient record.  Counseling The body needs vitamin B12: to make red blood cells; to make DNA; and to help the nerves work properly so they can carry messages from the brain to the body.  The main causes of vitamin B12 deficiency include dietary deficiency, digestive diseases, pernicious anemia, and having a surgery in which part of the stomach or small intestine is removed.  Certain medicines can make it harder for the body to absorb vitamin B12. These medicines include: heartburn medications; some antibiotics; some medications used to treat diabetes, gout, and high cholesterol.  In some cases, there are no symptoms of this condition. If the condition leads to anemia or nerve damage, various symptoms can occur, such as weakness or fatigue, shortness of breath, and numbness or tingling in your hands and feet.   Treatment:  May include taking vitamin B12 supplements.  Avoid alcohol.  Eat lots of healthy foods that contain vitamin B12: Beef, pork, chicken, [place], and organ meats, such as liver.  Seafood: This includes clams, rainbow trout, salmon, tuna, and haddock. Eggs.  Cereal and dairy products that are fortified: This means that vitamin B12 has been added to the food.    4. Obesity, current BMI 41.0 Terri [name] is currently in the action stage of change. As such, her goal is to continue with weight loss efforts. She has agreed to the Category 2 Plan.  Terri [name] will continue meal planning. We reviewed labs from 09/02/2021 Vitamin D, B 12 and  thryroid panel. She will pack her snacks. She will continue meal planning. Handout "On the Road" was provided today.   Exercise goals:  As is.  Behavioral modification strategies: increasing lean protein intake,  decreasing simple carbohydrates, increasing vegetables, increasing water intake, decreasing eating out, no skipping meals, meal planning and cooking strategies, keeping healthy foods in the home, and planning for success.  Terri [name] has agreed to follow-up with our clinic in 2 weeks. She was informed of the importance of frequent follow-up visits to maximize her success with intensive lifestyle modifications for her multiple health conditions.   Objective:   Blood pressure 135/85, pulse 82, temperature 97.9 F (36.6 C), height 5\' 6"  (1.676 m), weight 253 lb (114.8 kg), SpO2 97 %. Body mass index is 40.84 kg/m.  General: Cooperative, alert, well developed, in no acute distress. HEENT: Conjunctivae and lids unremarkable. Cardiovascular: Regular rhythm.  Lungs: Normal work of breathing. Neurologic: No focal deficits.   Lab Results  Component Value Date   CREATININE 0.73 06/27/2021   BUN 12 06/27/2021   NA 138 06/27/2021   K 4.8 06/27/2021   CL 101 06/27/2021   CO2 23 06/27/2021   Lab Results  Component Value Date   ALT 21 06/27/2021   AST 33 06/27/2021   ALKPHOS 57 06/27/2021   BILITOT <0.2 06/27/2021   Lab Results  Component Value Date   HGBA1C 7.3 (H) 06/27/2021   HGBA1C 7.4 (H) 02/28/2021   No results found for: INSULIN Lab Results  Component Value Date   TSH 0.909 08/22/2021   Lab Results  Component Value Date   CHOL 142 06/27/2021   HDL 56 06/27/2021   LDLCALC 50 06/27/2021   TRIG 226 (H) 06/27/2021   Lab Results  Component Value Date   VD25OH 42.6 08/22/2021   Lab Results  Component Value Date   WBC 5.4 06/27/2021   HGB 11.8 06/27/2021   HCT 36.0 06/27/2021   MCV 90 06/27/2021   PLT 225 06/27/2021   No results found for: IRON, TIBC, FERRITIN  Attestation Statements:   Reviewed by clinician on day of visit: allergies, medications, problem list, medical history, surgical history, family history, social history, and previous encounter notes.  I, Lizbeth Bark, RMA, am acting as Location manager for [employer], DO.  I have reviewed the above documentation for accuracy and completeness, and I agree with the above. Jearld Lesch, DO

## 2021-09-09 ENCOUNTER — Encounter (INDEPENDENT_AMBULATORY_CARE_PROVIDER_SITE_OTHER): Payer: Self-pay | Admitting: Bariatrics

## 2021-09-18 ENCOUNTER — Other Ambulatory Visit: Payer: Self-pay

## 2021-09-18 ENCOUNTER — Encounter (INDEPENDENT_AMBULATORY_CARE_PROVIDER_SITE_OTHER): Payer: Self-pay | Admitting: Family Medicine

## 2021-09-18 ENCOUNTER — Ambulatory Visit (INDEPENDENT_AMBULATORY_CARE_PROVIDER_SITE_OTHER): Payer: BC Managed Care – PPO | Admitting: Family Medicine

## 2021-09-18 VITALS — BP 131/79 | HR 81 | Temp 98.0°F | Ht 66.0 in | Wt 254.0 lb

## 2021-09-18 DIAGNOSIS — Z6841 Body Mass Index (BMI) 40.0 and over, adult: Secondary | ICD-10-CM | POA: Diagnosis not present

## 2021-09-18 DIAGNOSIS — F509 Eating disorder, unspecified: Secondary | ICD-10-CM | POA: Diagnosis not present

## 2021-09-18 DIAGNOSIS — E1165 Type 2 diabetes mellitus with hyperglycemia: Secondary | ICD-10-CM

## 2021-09-18 DIAGNOSIS — E669 Obesity, unspecified: Secondary | ICD-10-CM | POA: Diagnosis not present

## 2021-09-18 DIAGNOSIS — Z7984 Long term (current) use of oral hypoglycemic drugs: Secondary | ICD-10-CM

## 2021-09-18 NOTE — Progress Notes (Signed)
Chief Complaint:   OBESITY Terri [name] is here to discuss her progress with her obesity treatment plan along with follow-up of her obesity related diagnoses. Hanadi is on the Category 2 Plan and states she is following her eating plan approximately 90% of the time. Elexa states she is counting steps 10,000 daily and walking for 25 minutes 5 times per week.  Today's visit was #: 3 Starting weight: 255 lbs Starting date: 08/22/2021 Today's weight: 254 lbs Today's date: 09/18/2021 Total lbs lost to date: 1 lb Total lbs lost since last in-office visit: +1  Interim History: Talin is following Category 2 fairly well but exceeding 200 snack calories.  She says she tends to graze during the day.  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 She is only having 1 egg at breakfast.  She denies polyphagia.  Subjective:   1. Poorly controlled diabetes mellitus (HCC) Terri [name]'s diabetes mellitus is not well controlled. Her last A1C was 7.3.  This is managed by her PCP.  She is on [place] 20 units at night and Metformin 1000 mg 2 times a day. Her fasting CBGs were in the range of 107-132. Before bed, her CBGs are in the range 123-146. She denies hypoglycemia.   Lab Results  Component Value Date   HGBA1C 7.3 (H) 06/27/2021   HGBA1C 7.4 (H) 02/28/2021   Lab Results  Component Value Date   LDLCALC 50 06/27/2021   CREATININE 0.73 06/27/2021   No results found for: INSULIN  2. Eating disorder/Emotional eating Terri [name] notes some eating out of boredom on the weekends.   Assessment/Plan:   1. Poorly controlled diabetes mellitus (HCC) Elisha will continue Tresiba 20 units nightly and Metformin 1000 2 times a day. We discussed starting a GLP-1 agonist briefly.  2. Eating disorder/Emotional eating Terri [name] declined visit with Dr. Dewaine Conger our Bariatric Psychologist. Behavior modification techniques were discussed today to help [name] deal  with her emotional/non-hunger eating behaviors.    3. Obesity, current BMI 41.02 Terri [name] is currently in the action stage of change. As such, her goal is to continue with weight loss efforts. She has agreed to the Category 2 Plan.   Terri [name] will log snack calories and keep < than 200.  We went over Category 2 in depth.   Exercise goals:  As is.  Behavioral modification strategies: increasing lean protein intake and decreasing simple carbohydrates.  Terri [name] has agreed to follow-up with our clinic in 2 weeks with Dr. Manson Passey or Alois Cliche, PA-C and 4 weeks virtual.  Objective:   Blood pressure 131/79, pulse 81, temperature 98 F (36.7 C), height 5\' 6"  (1.676 m), weight 254 lb (115.2 kg), SpO2 97 %. Body mass index is 41 kg/m.  General: Cooperative, alert, well developed, in no acute distress. HEENT: Conjunctivae and lids unremarkable. Cardiovascular: Regular rhythm.  Lungs: Normal work of breathing. Neurologic: No focal deficits.   Lab Results  Component Value Date   CREATININE 0.73 06/27/2021   BUN 12 06/27/2021   NA 138 06/27/2021   K 4.8 06/27/2021   CL 101 06/27/2021   CO2 23 06/27/2021   Lab Results  Component Value Date   ALT 21 06/27/2021   AST 33 06/27/2021   ALKPHOS 57 06/27/2021   BILITOT <0.2 06/27/2021   Lab Results  Component Value Date   HGBA1C 7.3 (H) 06/27/2021   HGBA1C 7.4 (H) 02/28/2021   No results found for: INSULIN  Lab Results  Component Value Date   TSH 0.909 08/22/2021   Lab Results  Component Value Date   CHOL 142 06/27/2021   HDL 56 06/27/2021   LDLCALC 50 06/27/2021   TRIG 226 (H) 06/27/2021   Lab Results  Component Value Date   VD25OH 42.6 08/22/2021   Lab Results  Component Value Date   WBC 5.4 06/27/2021   HGB 11.8 06/27/2021   HCT 36.0 06/27/2021   MCV 90 06/27/2021   PLT 225 06/27/2021   No results found for: IRON, TIBC, FERRITIN  Attestation Statements:   Reviewed by clinician on day of visit: allergies,  medications, problem list, medical history, surgical history, family history, social history, and previous encounter notes.  Time spent on visit including pre-visit chart review and post-visit care and charting was 32 minutes.   I, Jackson Latino, RMA, am acting as Energy manager for [place], FNP.  I have reviewed the above documentation for accuracy and completeness, and I agree with the above. -  Jesse Sans, FNP

## 2021-10-01 ENCOUNTER — Other Ambulatory Visit: Payer: Self-pay

## 2021-10-01 ENCOUNTER — Telehealth: Payer: Self-pay

## 2021-10-01 DIAGNOSIS — I10 Essential (primary) hypertension: Secondary | ICD-10-CM

## 2021-10-01 DIAGNOSIS — E785 Hyperlipidemia, unspecified: Secondary | ICD-10-CM

## 2021-10-01 DIAGNOSIS — E0801 Diabetes mellitus due to underlying condition with hyperosmolarity with coma: Secondary | ICD-10-CM

## 2021-10-01 MED ORDER — VALSARTAN-HYDROCHLOROTHIAZIDE 320-25 MG PO TABS: 1.0000 | ORAL_TABLET | Freq: Every day | ORAL | 3 refills | Status: DC

## 2021-10-01 MED ORDER — METFORMIN HCL 500 MG PO TABS: 1000.0000 mg | ORAL_TABLET | Freq: Two times a day (BID) | ORAL | 1 refills | Status: DC

## 2021-10-01 MED ORDER — ROSUVASTATIN CALCIUM 10 MG PO TABS: 10.0000 mg | ORAL_TABLET | Freq: Every day | ORAL | 1 refills | Status: DC

## 2021-10-01 NOTE — Telephone Encounter (Signed)
Patient called need med refill  valsartan-hydrochlorothiazide (DIOVAN-HCT) 320-25 MG tablet  rosuvastatin (CRESTOR) 10 MG tablet   Pharmacy  [facility] - [place], [place] - [address] [facility] AT Portal to Registered [address]  [address] [place], [place] [address]  Phone:  [phone]  Fax:  [phone]

## 2021-10-01 NOTE — Telephone Encounter (Signed)
Refills sent

## 2021-10-08 ENCOUNTER — Ambulatory Visit (INDEPENDENT_AMBULATORY_CARE_PROVIDER_SITE_OTHER): Payer: BC Managed Care – PPO | Admitting: Bariatrics

## 2021-10-10 ENCOUNTER — Ambulatory Visit (INDEPENDENT_AMBULATORY_CARE_PROVIDER_SITE_OTHER): Payer: BC Managed Care – PPO | Admitting: Adult Health

## 2021-10-10 ENCOUNTER — Encounter (INDEPENDENT_AMBULATORY_CARE_PROVIDER_SITE_OTHER): Payer: Self-pay | Admitting: Adult Health

## 2021-10-10 ENCOUNTER — Other Ambulatory Visit: Payer: Self-pay

## 2021-10-10 VITALS — BP 146/83 | HR 100 | Temp 98.3°F | Ht 66.0 in | Wt 250.0 lb

## 2021-10-10 DIAGNOSIS — Z6841 Body Mass Index (BMI) 40.0 and over, adult: Secondary | ICD-10-CM | POA: Diagnosis not present

## 2021-10-10 DIAGNOSIS — E669 Obesity, unspecified: Secondary | ICD-10-CM | POA: Diagnosis not present

## 2021-10-10 DIAGNOSIS — E1169 Type 2 diabetes mellitus with other specified complication: Secondary | ICD-10-CM | POA: Diagnosis not present

## 2021-10-10 DIAGNOSIS — Z7984 Long term (current) use of oral hypoglycemic drugs: Secondary | ICD-10-CM

## 2021-10-10 DIAGNOSIS — Z794 Long term (current) use of insulin: Secondary | ICD-10-CM

## 2021-10-10 DIAGNOSIS — E1165 Type 2 diabetes mellitus with hyperglycemia: Secondary | ICD-10-CM

## 2021-10-10 DIAGNOSIS — E0801 Diabetes mellitus due to underlying condition with hyperosmolarity with coma: Secondary | ICD-10-CM

## 2021-10-10 MED ORDER — OZEMPIC (0.25 OR 0.5 MG/DOSE) 2 MG/1.5ML ~~LOC~~ SOPN: 0.2500 mg | PEN_INJECTOR | SUBCUTANEOUS | 0 refills | Status: DC

## 2021-10-15 NOTE — Progress Notes (Addendum)
? ? ? ?Chief Complaint:  ? ?OBESITY ?Terri [name] is here to discuss her progress with her obesity treatment plan along with follow-up of her obesity related diagnoses. Terri [name] is on the Category 2 Plan and states she is following her eating plan approximately 90% of the time. Terri [name] states she is walking for 30 minutes 3 times per week. ? ?Today's visit was #: 4 ?Starting weight: 255 lbs ?Starting date: 08/22/2021 ?Today's weight: 250 lbs ?Today's date: 10/10/2021 ?Total lbs lost to date: 5 lbs ?Total lbs lost since last in-office visit: 4 lbs ? ?Interim History:  ?Terri [name] says she achieves 7000-10,000 steps per day. ? ?Of note:  Terri [name] is a new patient to me.   ?She is postmenopausal. ? ?Subjective:  ? ?1. Poorly Controlled Diabetes Mellitus ?Metformin 500 mg - 1000 BID with meals. ?Tresiba 20 units QHS - both managed by PCP. ?Fasting 120-149.  PP 110-130.   ?She denies sx's of hypoglycemia. ?She denies family hx of MTC or personal hx of pancreatitis. ? ?Assessment/Plan:  ? ?1. Poorly Controlled Diabetes Mellitus  ?Start Ozempic 0.25 mg once weekly. ?Continue metformin 500 mg - 1000 mg BID. ?Decrease Tresiba to 18 units. ?Monitor BG closely < 70 consistently > 200- noted, then contact clinic. ?MyChart message sent to PCP with medication recommendations. ? ?- Start Semaglutide,0.25 or 0.5MG /DOS, (OZEMPIC, 0.25 OR 0.5 MG/DOSE,) 2 MG/1.5ML SOPN; Inject 0.25 mg into the skin once a week.  Dispense: 2 mL; Refill: 0 ? ?2. Obesity, current BMI 40.4 ? ?Terri [name] is currently in the action stage of change. As such, her goal is to continue with weight loss efforts. She has agreed to the Category 2 Plan.  ? ?Exercise goals:  As is. ? ?Behavioral modification strategies: increasing lean protein intake, decreasing simple carbohydrates, meal planning and cooking strategies, keeping healthy foods in the home, and planning for success. ? ?Terri [name] has agreed to follow-up with our clinic in 2 weeks. She was informed of the importance of  frequent follow-up visits to maximize her success with intensive lifestyle modifications for her multiple health conditions.  ? ?Objective:  ? ?Blood pressure (!) 146/83, pulse 100, temperature 98.3 ?F (36.8 ?C), height 5\' 6"  (1.676 m), weight 250 lb (113.4 kg), SpO2 99 %. ?Body mass index is 40.35 kg/m?. ? ?General: Cooperative, alert, well developed, in no acute distress. ?HEENT: Conjunctivae and lids unremarkable. ?Cardiovascular: Regular rhythm.  ?Lungs: Normal work of breathing. ?Neurologic: No focal deficits.  ? ?Lab Results  ?Component Value Date  ? CREATININE 0.73 06/27/2021  ? BUN 12 06/27/2021  ? NA 138 06/27/2021  ? K 4.8 06/27/2021  ? CL 101 06/27/2021  ? CO2 23 06/27/2021  ? ?Lab Results  ?Component Value Date  ? ALT 21 06/27/2021  ? AST 33 06/27/2021  ? ALKPHOS 57 06/27/2021  ? BILITOT <0.2 06/27/2021  ? ?Lab Results  ?Component Value Date  ? HGBA1C 7.3 (H) 06/27/2021  ? HGBA1C 7.4 (H) 02/28/2021  ? ?Lab Results  ?Component Value Date  ? TSH 0.909 08/22/2021  ? ?Lab Results  ?Component Value Date  ? CHOL 142 06/27/2021  ? HDL 56 06/27/2021  ? LDLCALC 50 06/27/2021  ? TRIG 226 (H) 06/27/2021  ? ?Lab Results  ?Component Value Date  ? VD25OH 42.6 08/22/2021  ? ?Lab Results  ?Component Value Date  ? WBC 5.4 06/27/2021  ? HGB 11.8 06/27/2021  ? HCT 36.0 06/27/2021  ? MCV 90 06/27/2021  ? PLT 225 06/27/2021  ? ?Attestation Statements:  ? ?Reviewed by  clinician on day of visit: allergies, medications, problem list, medical history, surgical history, family history, social history, and previous encounter notes. ? ?Time spent on visit including pre-visit chart review and post-visit care and charting was 30 minutes.  ? ?I, Insurance claims handler, CMA, am acting as Energy manager for William Hamburger, NP. ? ?I have reviewed the above documentation for accuracy and completeness, and I agree with the above. -  Banks Chaikin d. Larwence Tu, NP-C ?

## 2021-10-16 DIAGNOSIS — E1165 Type 2 diabetes mellitus with hyperglycemia: Secondary | ICD-10-CM | POA: Insufficient documentation

## 2021-10-16 MED ORDER — OZEMPIC (0.25 OR 0.5 MG/DOSE) 2 MG/1.5ML ~~LOC~~ SOPN: 0.2500 mg | PEN_INJECTOR | SUBCUTANEOUS | 0 refills | Status: DC

## 2021-10-16 NOTE — Addendum Note (Signed)
Addended by: Mina Marble D on: 10/16/2021 01:50 PM ? ? Modules accepted: Orders ? ?

## 2021-10-21 NOTE — Progress Notes (Signed)
TeleHealth Visit:  Due to the COVID-19 pandemic, this visit was completed with telemedicine (audio/video) technology to reduce patient and provider exposure as well as to preserve personal protective equipment.   Terri [name] has verbally consented to this TeleHealth visit. The patient is located at home, the provider is located at home. The participants in this visit include the listed provider and patient. The visit was conducted today via MyChart video.  OBESITY Terri [name] is here to discuss her progress with her obesity treatment plan along with follow-up of her obesity related diagnoses.   Today's date: 10/21/2021 Today's visit was # 5 Starting weight: 255 lbs Starting date: 08/22/2021 Weight at last in office visit: 250 lbs Today's reported weight: 247.2 lbs    Interim History: Her weight at home today reflects a 3 pound weight loss since her last in office visit.  She is happy with the cat 3 plan. She packs her lunch for school. She cooks a few days per week and then eats leftovers for dinner. She is having a Health visitor English muffin at breakfast.  She is pleased with being able to get into a dress that was too tight.  She drinks non-caloric beverages only.   Nutrition Plan: the Category 2 Plan.  Hunger is well controlled. Cravings are well controlled.  Current exercise: walking 3 days 30 minutes per week Assessment/Plan:  Poorly Controlled Type II Diabetes HgbA1c is not at goal. CBGs: Fasting 120s, 2 hour PP 120-140  Any episodes of hypoglycemia? Yes, one instance -90. She felt symptomatic.  Medication(s): Ozempic 0.25 mg weekly, Tresiba 18 units, Metformin 100 mg BID. Tolerating the Ozempic well.  She feels it is helping reduce her appetite.  Lab Results  Component Value Date   HGBA1C 7.3 (H) 06/27/2021   HGBA1C 7.4 (H) 02/28/2021   Lab Results  Component Value Date   LDLCALC 50 06/27/2021   CREATININE 0.73 06/27/2021    Plan: Continue Ozempic, metformin  and Tresiba at current dose.  Consider increasing Ozempic to 0.5 mg and decreasing Tresiba dose at next office visit.  Obesity: Current BMI 40.37 Terri [name] is currently in the action stage of change. As such, her goal is to continue with weight loss efforts. She has agreed to the Category 2 Plan.   Exercise goals: Continue walking 3 to 4 days/week.  Behavioral modification strategies: meal planning and cooking strategies and planning for success.  Terri [name] has agreed to follow-up with our clinic in 3 weeks.   No orders of the defined types were placed in this encounter.   There are no discontinued medications.   No orders of the defined types were placed in this encounter.     Objective:   VITALS: Per patient if applicable, see vitals. GENERAL: Alert and in no acute distress. CARDIOPULMONARY: No increased WOB. Speaking in clear sentences.  PSYCH: Pleasant and cooperative. Speech normal rate and rhythm. Affect is appropriate. Insight and judgement are appropriate. Attention is focused, linear, and appropriate.  NEURO: Oriented as arrived to appointment on time with no prompting.   Lab Results  Component Value Date   CREATININE 0.73 06/27/2021   BUN 12 06/27/2021   NA 138 06/27/2021   K 4.8 06/27/2021   CL 101 06/27/2021   CO2 23 06/27/2021   Lab Results  Component Value Date   ALT 21 06/27/2021   AST 33 06/27/2021   ALKPHOS 57 06/27/2021   BILITOT <0.2 06/27/2021   Lab Results  Component Value Date   HGBA1C 7.3 (H)  06/27/2021   HGBA1C 7.4 (H) 02/28/2021   No results found for: INSULIN Lab Results  Component Value Date   TSH 0.909 08/22/2021   Lab Results  Component Value Date   CHOL 142 06/27/2021   HDL 56 06/27/2021   LDLCALC 50 06/27/2021   TRIG 226 (H) 06/27/2021   Lab Results  Component Value Date   WBC 5.4 06/27/2021   HGB 11.8 06/27/2021   HCT 36.0 06/27/2021   MCV 90 06/27/2021   PLT 225 06/27/2021   No results found for: IRON, TIBC,  FERRITIN Lab Results  Component Value Date   VD25OH 42.6 08/22/2021    Attestation Statements:   Reviewed by clinician on day of visit: allergies, medications, problem list, medical history, surgical history, family history, social history, and previous encounter notes.

## 2021-10-22 ENCOUNTER — Telehealth (INDEPENDENT_AMBULATORY_CARE_PROVIDER_SITE_OTHER): Payer: BC Managed Care – PPO | Admitting: Family Medicine

## 2021-10-22 ENCOUNTER — Encounter (INDEPENDENT_AMBULATORY_CARE_PROVIDER_SITE_OTHER): Payer: Self-pay | Admitting: Family Medicine

## 2021-10-22 DIAGNOSIS — Z6841 Body Mass Index (BMI) 40.0 and over, adult: Secondary | ICD-10-CM

## 2021-10-22 DIAGNOSIS — E669 Obesity, unspecified: Secondary | ICD-10-CM

## 2021-10-22 DIAGNOSIS — E1165 Type 2 diabetes mellitus with hyperglycemia: Secondary | ICD-10-CM

## 2021-11-11 ENCOUNTER — Ambulatory Visit (INDEPENDENT_AMBULATORY_CARE_PROVIDER_SITE_OTHER): Payer: BC Managed Care – PPO | Admitting: Adult Health

## 2021-11-11 ENCOUNTER — Encounter (INDEPENDENT_AMBULATORY_CARE_PROVIDER_SITE_OTHER): Payer: Self-pay | Admitting: Adult Health

## 2021-11-11 VITALS — BP 147/76 | HR 94 | Temp 98.4°F | Ht 66.0 in | Wt 246.0 lb

## 2021-11-11 DIAGNOSIS — Z7985 Long-term (current) use of injectable non-insulin antidiabetic drugs: Secondary | ICD-10-CM

## 2021-11-11 DIAGNOSIS — E669 Obesity, unspecified: Secondary | ICD-10-CM

## 2021-11-11 DIAGNOSIS — E1165 Type 2 diabetes mellitus with hyperglycemia: Secondary | ICD-10-CM

## 2021-11-11 DIAGNOSIS — E1159 Type 2 diabetes mellitus with other circulatory complications: Secondary | ICD-10-CM

## 2021-11-11 DIAGNOSIS — I152 Hypertension secondary to endocrine disorders: Secondary | ICD-10-CM

## 2021-11-11 DIAGNOSIS — Z6839 Body mass index (BMI) 39.0-39.9, adult: Secondary | ICD-10-CM

## 2021-11-11 DIAGNOSIS — Z9189 Other specified personal risk factors, not elsewhere classified: Secondary | ICD-10-CM

## 2021-11-11 MED ORDER — OZEMPIC (0.25 OR 0.5 MG/DOSE) 2 MG/1.5ML ~~LOC~~ SOPN: 0.5000 mg | PEN_INJECTOR | SUBCUTANEOUS | 0 refills | Status: DC

## 2021-11-13 ENCOUNTER — Encounter (INDEPENDENT_AMBULATORY_CARE_PROVIDER_SITE_OTHER): Payer: Self-pay | Admitting: Adult Health

## 2021-11-13 ENCOUNTER — Other Ambulatory Visit (INDEPENDENT_AMBULATORY_CARE_PROVIDER_SITE_OTHER): Payer: Self-pay | Admitting: Adult Health

## 2021-11-13 LAB — CMP14+EGFR
ALT: 16 IU/L (ref 0–32)
AST: 13 IU/L (ref 0–40)
Albumin/Globulin Ratio: 2 (ref 1.2–2.2)
Albumin: 4.7 g/dL (ref 3.8–4.9)
Alkaline Phosphatase: 51 IU/L (ref 44–121)
BUN/Creatinine Ratio: 23 (ref 9–23)
BUN: 15 mg/dL (ref 6–24)
Bilirubin Total: 0.3 mg/dL (ref 0.0–1.2)
CO2: 25 mmol/L (ref 20–29)
Calcium: 9.5 mg/dL (ref 8.7–10.2)
Chloride: 99 mmol/L (ref 96–106)
Creatinine, Ser: 0.65 mg/dL (ref 0.57–1.00)
Globulin, Total: 2.4 g/dL (ref 1.5–4.5)
Glucose: 97 mg/dL (ref 70–99)
Potassium: 4.5 mmol/L (ref 3.5–5.2)
Sodium: 137 mmol/L (ref 134–144)
Total Protein: 7.1 g/dL (ref 6.0–8.5)
eGFR: 105 mL/min/{1.73_m2} (ref >59)

## 2021-11-13 LAB — LIPID PANEL
Chol/HDL Ratio: 2 ratio (ref 0.0–4.4)
Cholesterol, Total: 117 mg/dL (ref 100–199)
HDL: 58 mg/dL (ref >39)
LDL Chol Calc (NIH): 43 mg/dL (ref 0–99)
Triglycerides: 77 mg/dL (ref 0–149)
VLDL Cholesterol Cal: 16 mg/dL (ref 5–40)

## 2021-11-13 LAB — HEMOGLOBIN A1C
Est. average glucose Bld gHb Est-mCnc: 137 mg/dL
Hgb A1c MFr Bld: 6.4 % — ABNORMAL HIGH (ref 4.8–5.6)

## 2021-11-13 MED ORDER — OZEMPIC (0.25 OR 0.5 MG/DOSE) 2 MG/1.5ML ~~LOC~~ SOPN: 0.5000 mg | PEN_INJECTOR | SUBCUTANEOUS | 0 refills | Status: DC

## 2021-11-13 NOTE — Progress Notes (Signed)
? ? ? ?Chief Complaint:  ? ?OBESITY ?Seniya is here to discuss her progress with her obesity treatment plan along with follow-up of her obesity related diagnoses. Terri [name] is on the Category 2 Plan and states she is following her eating plan approximately 80% of the time. Terri [name] states she is walking/using the treadmill for 30 minutes 2-3 times per week. ? ?Today's visit was #: 6 ?Starting weight: 255 lbs ?Starting date: 08/22/2021 ?Today's weight: 246 lbs ?Today's date: 11/11/2021 ?Total lbs lost to date: 9 lbs ?Total lbs lost since last in-office visit: +1 lb ? ?Interim History:  ?Current weight 246lbs. ?She wants to lose down to 230 lbs by June. ?June trip to [facility], then onto [place]. ? ?Subjective:  ? ?1. Poorly controlled diabetes mellitus (HCC) ?On 06/27/2021 - A1c was 7.3, BG 149 - both above goa.Marland Kitchen ?Ambulatory fasting BG 1100-120, PP 99-110s. ?When in the 90s - shaky.   ?Currently on Metformin 500mg  - 2 tabs/1000mg  BID with meals,Ozempic 0.25mg  once week, Tresbia 18 U QD. ?She denies mass in neck, dysphagia, dyspepsia, persistent hoarseness, abd pain, or N/V/Constipation. ? ?2. Hypertension associated with diabetes (HCC) ?Ambulatory: SBP 120-130, DBP 70-80. ?No acute cardiac symptoms. ?She is on Norvasc 5 mg daily, Diovan/HCT 320/25 mg daily. ? ?3. At risk for hypoglycemia ?Phoebie is at increased risk for hypoglycemia due to T2D and steady weight loss. ? ?Assessment/Plan:  ? ?1. Poorly controlled diabetes mellitus (HCC) ?Decrease Tresiba to 18 units to 12 units.   ?Increase Ozempic 0.5 mg once weekly. ?Monitor BG closely, bring log to follow-up. ?Contact clinic with any BG <70 or consistently >200. ? ?2. Hypertension associated with diabetes (HCC) ?Monitor BP at home - bring log to follow-up. ? ?3. At risk for hypoglycemia ?Terri [name] was given approximately 15 minutes of counseling today regarding prevention of hypoglycemia. She was advised of symptoms of hypoglycemia. Terri [name] was instructed to avoid  skipping meals, eat regular protein rich meals, and schedule low calorie snacks as needed. ? ?Repetitive spaced learning was employed today to elicit superior memory formation and behavioral change.  ? ?4. Obesity, current BMI 39.7 ? ?Terri [name] is currently in the action stage of change. As such, her goal is to continue with weight loss efforts. She has agreed to the Category 2 Plan.  ? ?Alternate in office and virtual with Britta Mccreedy, FNP ? ?Exercise goals:  As is. ? ?Behavioral modification strategies: increasing lean protein intake, decreasing simple carbohydrates, meal planning and cooking strategies, keeping healthy foods in the home, and planning for success. ? ?Terri [name] has agreed to follow-up with our clinic in 3 weeks. She was informed of the importance of frequent follow-up visits to maximize her success with intensive lifestyle modifications for her multiple health conditions.  ? ?Objective:  ? ?Blood pressure (!) 147/76, pulse 94, temperature 98.4 ?F (36.9 ?C), height 5\' 6"  (1.676 m), weight 246 lb (111.6 kg), SpO2 98 %. ?Body mass index is 39.71 kg/m?. ? ?General: Cooperative, alert, well developed, in no acute distress. ?HEENT: Conjunctivae and lids unremarkable. ?Cardiovascular: Regular rhythm.  ?Lungs: Normal work of breathing. ?Neurologic: No focal deficits.  ? ?Lab Results  ?Component Value Date  ? CREATININE 0.65 11/12/2021  ? BUN 15 11/12/2021  ? NA 137 11/12/2021  ? K 4.5 11/12/2021  ? CL 99 11/12/2021  ? CO2 25 11/12/2021  ? ?Lab Results  ?Component Value Date  ? ALT 16 11/12/2021  ? AST 13 11/12/2021  ? ALKPHOS 51 11/12/2021  ? BILITOT 0.3 11/12/2021  ? ?  Lab Results  ?Component Value Date  ? HGBA1C 6.4 (H) 11/12/2021  ? HGBA1C 7.3 (H) 06/27/2021  ? HGBA1C 7.4 (H) 02/28/2021  ? ?Lab Results  ?Component Value Date  ? TSH 0.909 08/22/2021  ? ?Lab Results  ?Component Value Date  ? CHOL 117 11/12/2021  ? HDL 58 11/12/2021  ? LDLCALC 43 11/12/2021  ? TRIG 77 11/12/2021  ? CHOLHDL 2.0 11/12/2021   ? ?Lab Results  ?Component Value Date  ? VD25OH 42.6 08/22/2021  ? ?Lab Results  ?Component Value Date  ? WBC 5.4 06/27/2021  ? HGB 11.8 06/27/2021  ? HCT 36.0 06/27/2021  ? MCV 90 06/27/2021  ? PLT 225 06/27/2021  ? ?Attestation Statements:  ? ?Reviewed by clinician on day of visit: allergies, medications, problem list, medical history, surgical history, family history, social history, and previous encounter notes. ? ?I, Insurance claims handler, CMA, am acting as Energy manager for William Hamburger, NP. ? ?I have reviewed the above documentation for accuracy and completeness, and I agree with the above. -  Seward Coran d. Ciarra Braddy, NP-C ?

## 2021-11-13 NOTE — Telephone Encounter (Signed)
Ozempic 0.25/0.5 no longer comes in 1.66ml pen. Now in 58ml. Will need new rx.

## 2021-11-15 ENCOUNTER — Ambulatory Visit: Payer: BC Managed Care – PPO | Admitting: Nurse Practitioner

## 2021-12-03 NOTE — Progress Notes (Signed)
?TeleHealth Visit:  ?This visit was completed with telemedicine (audio/video) technology. ?Terri [name] has verbally consented to this TeleHealth visit. The patient is located at home, the provider is located at home. The participants in this visit include the listed provider and patient. The visit was conducted today via MyChart video. ? ?OBESITY ?Terri [name] is here to discuss her progress with her obesity treatment plan along with follow-up of her obesity related diagnoses.  ? ?Today's visit was # 7 ?Starting weight: 255 lbs ?Starting date: 08/22/2021 ?Weight at last in office visit: 246 lbs on 11/11/21 ?Total weight loss: 9 lbs at last in office visit on 11/11/21. ?Today's reported weight: 243.1 lbs  ? ?Nutrition Plan: the Category 2 Plan.  ?Hunger is well controlled. Cravings are well controlled.  ?Current exercise:  8000 steps/day. 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 ? ?Interim History: Terri [name] is currently very motivated to adhere well to the plan.  She wants to lose down to 230 pounds by June when she will go to [facility] and then [place].  She is very happy with her the results of her weight loss and she says people are noticing. ?She is no longer having headaches as she was when she first started our program.  She is eating all the food on the plan.  Protein intake is good.  She drinks 80 ounces of water per day.  She packs her lunch for work.  Usually has a Health visitor English muffin for breakfast.  She cooks a few days per week and then eats leftovers for dinner. ? ?She wants to lose down to 230 lbs by June. ?June - trip to [place], then onto [place]. ? ?Assessment/Plan:  ?1. Type II Diabetes ?HgbA1c is at goal.  A1c decreased from 7.3 in November to 6.4 on April 4. ?Triglycerides decreased to 77 from 226 in November. ?CBGs: Fasting <130. Before lunch low 100s. Dinner time 120s. ?Any episodes  of hypoglycemia? yes - felt symptomatic at 80 ?Medication(s): Ozempic 0.5 mg weekly, Tresiba 12 units, metformin 1000 mg twice daily. ?Crestor 10 mg daily ? ?Lab Results  ?Component Value Date  ? HGBA1C 6.4 (H) 11/12/2021  ? HGBA1C 7.3 (H) 06/27/2021  ? HGBA1C 7.4 (H) 02/28/2021  ? ?Lab Results  ?Component Value Date  ? LDLCALC 43 11/12/2021  ? CREATININE 0.65 11/12/2021  ? ? ?Plan: ?Continue Ozempic and metformin at current dose. ?Decrease dose of Tresiba to 9 units nightly. ? ?2. Hypertension ?Hypertension reasonably well controlled based on home BPs.  ?Home blood pressures are running 130s over 70 to 80s. ?Medication(s): Amlodipine 5 mg daily, valsartan/HCTZ 320/25 mg daily ?Denies chest pain and SOB. ? ?BP Readings from Last 3 Encounters:  ?11/11/21 (!) 147/76  ?10/10/21 (!) 146/83  ?09/18/21 131/79  ? ?Lab Results  ?Component Value Date  ? CREATININE 0.65 11/12/2021  ? CREATININE 0.73 06/27/2021  ? CREATININE 0.65 02/28/2021  ? ? ?Plan: ?Current treatment plan is effective, no change in therapy.. ? ?3. Obesity: Current BMI 39.7 ?Braley is currently in the action stage of change. As such, her goal is to continue with weight loss efforts.  ?She has agreed to the Category 2 Plan.  ? ?Exercise goals: She plans on getting back to walking on the treadmill 3 to 4 days/week. ? ?Behavioral modification strategies: increasing lean protein intake, decreasing simple carbohydrates, and planning for  success. ? ?Ronnell has agreed to follow-up with our clinic in 4 weeks.  ? ?No orders of the defined types were placed in this encounter. ? ? ?There are no discontinued medications.  ? ?No orders of the defined types were placed in this encounter. ?   ? ?Objective:  ? ?VITALS: Per patient if applicable, see vitals. ?GENERAL: Alert and in no acute distress. ?CARDIOPULMONARY: No increased WOB. Speaking in clear sentences.  ?PSYCH: Pleasant and cooperative. Speech normal rate and rhythm. Affect is appropriate. Insight and judgement  are appropriate. Attention is focused, linear, and appropriate.  ?NEURO: Oriented as arrived to appointment on time with no prompting.  ? ?Lab Results  ?Component Value Date  ? CREATININE 0.65 11/12/2021  ? BUN 15 11/12/2021  ? NA 137 11/12/2021  ? K 4.5 11/12/2021  ? CL 99 11/12/2021  ? CO2 25 11/12/2021  ? ?Lab Results  ?Component Value Date  ? ALT 16 11/12/2021  ? AST 13 11/12/2021  ? ALKPHOS 51 11/12/2021  ? BILITOT 0.3 11/12/2021  ? ?Lab Results  ?Component Value Date  ? HGBA1C 6.4 (H) 11/12/2021  ? HGBA1C 7.3 (H) 06/27/2021  ? HGBA1C 7.4 (H) 02/28/2021  ? ?No results found for: INSULIN ?Lab Results  ?Component Value Date  ? TSH 0.909 08/22/2021  ? ?Lab Results  ?Component Value Date  ? CHOL 117 11/12/2021  ? HDL 58 11/12/2021  ? LDLCALC 43 11/12/2021  ? TRIG 77 11/12/2021  ? CHOLHDL 2.0 11/12/2021  ? ?Lab Results  ?Component Value Date  ? WBC 5.4 06/27/2021  ? HGB 11.8 06/27/2021  ? HCT 36.0 06/27/2021  ? MCV 90 06/27/2021  ? PLT 225 06/27/2021  ? ?No results found for: IRON, TIBC, FERRITIN ?Lab Results  ?Component Value Date  ? VD25OH 42.6 08/22/2021  ? ? ?Attestation Statements:  ? ?Reviewed by clinician on day of visit: allergies, medications, problem list, medical history, surgical history, family history, social history, and previous encounter notes. ? ?Time spent on visit including pre-visit chart review and post-visit charting and care was 32 minutes.  ? ? ?

## 2021-12-04 ENCOUNTER — Encounter (INDEPENDENT_AMBULATORY_CARE_PROVIDER_SITE_OTHER): Payer: Self-pay | Admitting: Family Medicine

## 2021-12-04 ENCOUNTER — Telehealth (INDEPENDENT_AMBULATORY_CARE_PROVIDER_SITE_OTHER): Payer: BC Managed Care – PPO | Admitting: Family Medicine

## 2021-12-04 DIAGNOSIS — Z6839 Body mass index (BMI) 39.0-39.9, adult: Secondary | ICD-10-CM

## 2021-12-04 DIAGNOSIS — E1159 Type 2 diabetes mellitus with other circulatory complications: Secondary | ICD-10-CM

## 2021-12-04 DIAGNOSIS — I152 Hypertension secondary to endocrine disorders: Secondary | ICD-10-CM | POA: Diagnosis not present

## 2021-12-04 DIAGNOSIS — E669 Obesity, unspecified: Secondary | ICD-10-CM

## 2021-12-04 DIAGNOSIS — E1169 Type 2 diabetes mellitus with other specified complication: Secondary | ICD-10-CM

## 2021-12-04 DIAGNOSIS — Z794 Long term (current) use of insulin: Secondary | ICD-10-CM

## 2021-12-09 ENCOUNTER — Other Ambulatory Visit (INDEPENDENT_AMBULATORY_CARE_PROVIDER_SITE_OTHER): Payer: Self-pay | Admitting: Adult Health

## 2021-12-09 MED ORDER — OZEMPIC (0.25 OR 0.5 MG/DOSE) 2 MG/1.5ML ~~LOC~~ SOPN: 0.5000 mg | PEN_INJECTOR | SUBCUTANEOUS | 0 refills | Status: DC

## 2021-12-09 NOTE — Telephone Encounter (Signed)
LAST APPOINTMENT DATE: 12/04/21 ?NEXT APPOINTMENT DATE: 01/01/22 ? ? ?[id number] [place], [place] - [address] [facility] ?[address] ?[place] [address] ?Phone: [phone] Fax: [phone] ? ?[facility] [place] [facility] [place] [place] - [address] ?[address] ?[place] [address] ?Phone: [phone] Fax: [phone] ? ?[facility] - [place], [place] - [address] [facility] ?[facility] ?[place] [address] ?Phone: [phone] Fax: [phone] ? ?Patient is requesting a refill of the following medications: ?Pending Prescriptions:                       Disp   Refills ?  Semaglutide,0.25 or 0.5MG /DOS, (OZEMPIC, 0*3 mL   0       ?Sig: Inject 0.5 mg into the skin once a week. ? ? ?Date last filled: 11/13/21 ?Previously prescribed by William Hamburger ? ?Lab Results ?     Component                Value               Date                 ?     HGBA1C                   6.4 (H)             11/12/2021           ?     HGBA1C                   7.3 (H)             06/27/2021           ?     HGBA1C                   7.4 (H)             02/28/2021           ?Lab Results ?     Component                Value               Date                 ?     LDLCALC                  43                  11/12/2021           ?     CREATININE               0.65                11/12/2021           ?Lab Results ?     Component                Value               Date                 ?     VD25OH                   42.6  08/22/2021           ? ?BP Readings from Last 3 Encounters: ?11/11/21 : (!) 147/76 ?10/10/21 : (!) 146/83 ?09/18/21 : 131/79 ?

## 2021-12-10 ENCOUNTER — Ambulatory Visit (INDEPENDENT_AMBULATORY_CARE_PROVIDER_SITE_OTHER): Payer: BC Managed Care – PPO | Admitting: Nurse Practitioner

## 2021-12-10 ENCOUNTER — Encounter: Payer: Self-pay | Admitting: Nurse Practitioner

## 2021-12-10 VITALS — BP 128/81 | HR 104 | Ht 66.0 in | Wt 246.0 lb

## 2021-12-10 DIAGNOSIS — E0801 Diabetes mellitus due to underlying condition with hyperosmolarity with coma: Secondary | ICD-10-CM

## 2021-12-10 DIAGNOSIS — Z6841 Body Mass Index (BMI) 40.0 and over, adult: Secondary | ICD-10-CM

## 2021-12-10 DIAGNOSIS — E785 Hyperlipidemia, unspecified: Secondary | ICD-10-CM | POA: Diagnosis not present

## 2021-12-10 DIAGNOSIS — Z794 Long term (current) use of insulin: Secondary | ICD-10-CM

## 2021-12-10 DIAGNOSIS — Z139 Encounter for screening, unspecified: Secondary | ICD-10-CM

## 2021-12-10 DIAGNOSIS — I10 Essential (primary) hypertension: Secondary | ICD-10-CM

## 2021-12-10 NOTE — Assessment & Plan Note (Addendum)
Wt Readings from Last 3 Encounters:  ?12/10/21 246 lb (111.6 kg)  ?11/11/21 246 lb (111.6 kg)  ?10/10/21 250 lb (113.4 kg)  ?on ozempic, followed by weight management  ?she has lost 4lb since last visit, appreciate collaboration with weight management.  ?Need to increase intake of whole foods consisting mainly vegetables and protein, less carbohydrates, drink at least 64 ounces of water daily, engaging in regular vigorous exercises at least 30 minutes daily, importance of portion control also discussed.  ?

## 2021-12-10 NOTE — Assessment & Plan Note (Signed)
BP Readings from Last 3 Encounters:  ?12/10/21 128/81  ?11/11/21 (!) 147/76  ?10/10/21 (!) 146/83  ?chronic condition well controlled  ?Continue amlodipine 57m daily, DIOVAN-HCT 320-22mtablet daily ?DASH diet advised , engage in regular vigorous excercises at least 15082mtes weekly ?Lab Results  ?Component Value Date  ? NA 137 11/12/2021  ? K 4.5 11/12/2021  ? CO2 25 11/12/2021  ? GLUCOSE 97 11/12/2021  ? BUN 15 11/12/2021  ? CREATININE 0.65 11/12/2021  ? CALCIUM 9.5 11/12/2021  ? EGFR 105 11/12/2021  ? GFRNONAA >60 11/28/2017  ? ?

## 2021-12-10 NOTE — Assessment & Plan Note (Addendum)
Lab Results  ?Component Value Date  ? HGBA1C 6.4 (H) 11/12/2021  ?well controlled on metformin 1000mg  BID, OZempic 0.5mg  once weekly injection, tresiba 9 units daily ?CBG logs from home reviewed today , no hypoglycemic episode ?Pt states that she will schedule eye exam at her opthamologist office ?Foot exam completed today.  ?Avoid sugar sweets soda. ?appreciate collaboration with weight management  ?

## 2021-12-10 NOTE — Progress Notes (Addendum)
? ?  Terri [name]     MRN: [id number]      DOB: Sep 01, 1965 ? ? ?HPI ?Terri [name] with past medical history of type 2 diabetes, hypertension, obesity, hyperlipidemia is here for follow up and re-evaluation of chronic medical conditions, medication management and review of any available recent lab.  ? ? ?The PT denies any adverse reactions to current medications since the last visit.  ?There are no new concerns.  ? ? ? ?ROS ?Denies recent fever or chills. ?Denies sinus pressure, nasal congestion, ear pain or sore throat. ?Denies chest congestion, productive cough or wheezing. ?Denies chest pains, palpitations and leg swelling ?Denies abdominal pain, nausea, vomiting,diarrhea or constipation.   ?Denies dysuria, frequency, hesitancy or incontinence. ?Denies joint pain, swelling and limitation in mobility. ?Denies headaches, seizures, numbness, or tingling. ? ?PE ? ?BP 128/81 (BP Location: Right Arm, Patient Position: Sitting, Cuff Size: Large)   Pulse (!) 104   Ht 5\' 6"  (1.676 m)   Wt 246 lb (111.6 kg)   SpO2 98%   BMI 39.71 kg/m?  ? ?Patient alert and oriented and in no cardiopulmonary distress. ? ? ?Chest: Clear to auscultation bilaterally. ? ?CVS: S1, S2 no murmurs, no S3.Regular rate. ? ?ABD: Soft non tender.  ? ?Ext: No edema ? ?MS: Adequate ROM spine, shoulders, hips and knees. ? ?Skin: Intact, no ulcerations or rash noted. ? ?Psych: Good eye contact, normal affect. Memory intact not anxious or depressed appearing. ? ? ? ? ?Assessment & Plan ? ?Class 3 severe obesity with serious comorbidity and body mass index (BMI) of 40.0 to 44.9 in adult Terri [name]) ?Wt Readings from Last 3 Encounters:  ?12/10/21 246 lb (111.6 kg)  ?11/11/21 246 lb (111.6 kg)  ?10/10/21 250 lb (113.4 kg)  ?she has lost 4lb since last visit, appreciate collaboration with weight management.  ?Need to increase intake of whole foods consisting mainly vegetables and protein, less carbohydrates, drink at least 64 ounces of water daily, engaging  in regular vigorous exercises at least 30 minutes daily, importance of portion control also discussed.  ? ?HLD (hyperlipidemia) ?Lab Results  ?Component Value Date  ? CHOL 117 11/12/2021  ? HDL 58 11/12/2021  ? LDLCALC 43 11/12/2021  ? TRIG 77 11/12/2021  ? CHOLHDL 2.0 11/12/2021  ?continue crestor 10mg  daily ? ?DM (diabetes mellitus) ([place]) ?Lab Results  ?Component Value Date  ? HGBA1C 6.4 (H) 11/12/2021  ?well controlled on metformin 1000mg  BID, OZempic 0.5mg  once weekly injection, tresiba 9 units daily ?CBG logs from home reviewed today , no hypoglycemic episode ?Pt states that she will schedule eye exam at her opthamologist office ?Foot exam completed today.  ?Avoid sugar sweets soda  ? ?Essential hypertension ?BP Readings from Last 3 Encounters:  ?12/10/21 128/81  ?11/11/21 (!) 147/76  ?10/10/21 (!) 146/83  ?chronic condition well controlled  ?Continue amlodipine 5mg  daily, DIOVAN-HCT 320-25mg  tablet daily ?DASH diet advised , engage in regular vigorous excercises at least 163minutes weekly ?Lab Results  ?Component Value Date  ? NA 137 11/12/2021  ? K 4.5 11/12/2021  ? CO2 25 11/12/2021  ? GLUCOSE 97 11/12/2021  ? BUN 15 11/12/2021  ? CREATININE 0.65 11/12/2021  ? CALCIUM 9.5 11/12/2021  ? EGFR 105 11/12/2021  ? GFRNONAA >60 11/28/2017  ?  ?

## 2021-12-10 NOTE — Assessment & Plan Note (Addendum)
Lab Results  ?Component Value Date  ? CHOL 117 11/12/2021  ? HDL 58 11/12/2021  ? LDLCALC 43 11/12/2021  ? TRIG 77 11/12/2021  ? CHOLHDL 2.0 11/12/2021  ?continue crestor 10mg  daily ?

## 2021-12-10 NOTE — Patient Instructions (Signed)

## 2022-01-01 ENCOUNTER — Encounter (INDEPENDENT_AMBULATORY_CARE_PROVIDER_SITE_OTHER): Payer: Self-pay | Admitting: Adult Health

## 2022-01-01 ENCOUNTER — Ambulatory Visit (INDEPENDENT_AMBULATORY_CARE_PROVIDER_SITE_OTHER): Payer: BC Managed Care – PPO | Admitting: Adult Health

## 2022-01-01 VITALS — BP 124/79 | HR 86 | Temp 98.4°F | Ht 66.0 in | Wt 244.0 lb

## 2022-01-01 DIAGNOSIS — Z6839 Body mass index (BMI) 39.0-39.9, adult: Secondary | ICD-10-CM | POA: Diagnosis not present

## 2022-01-01 DIAGNOSIS — Z7985 Long-term (current) use of injectable non-insulin antidiabetic drugs: Secondary | ICD-10-CM

## 2022-01-01 DIAGNOSIS — E669 Obesity, unspecified: Secondary | ICD-10-CM | POA: Diagnosis not present

## 2022-01-01 DIAGNOSIS — E1169 Type 2 diabetes mellitus with other specified complication: Secondary | ICD-10-CM | POA: Diagnosis not present

## 2022-01-01 MED ORDER — OZEMPIC (0.25 OR 0.5 MG/DOSE) 2 MG/1.5ML ~~LOC~~ SOPN: 0.5000 mg | PEN_INJECTOR | SUBCUTANEOUS | 0 refills | Status: DC

## 2022-01-04 NOTE — Progress Notes (Unsigned)
Chief Complaint:   OBESITY Terri [name] is here to discuss her progress with her obesity treatment plan along with follow-up of her obesity related diagnoses. Terri [name] is on the Category 2 Plan and states she is following her eating plan approximately 75% of the time. Terri [name] states she is not exercising.  Today's visit was #: 8 Starting weight: 255 lbs Starting date: 08/22/2021 Today's weight: 244 lbs Today's date: 01/01/2022 Total lbs lost to date: 11 lbs Total lbs lost since last in-office visit: 2 lbs  Interim History: 12/04/2021:  she reported 2 episodes of hypoglycemia with blood glucose in 80's.  Terri [name] decreased Tresiba from 12 units to 9 units.  Subjective:   1. Type 2 diabetes mellitus with other specified complication, without long-term current use of insulin ([place]) Home blood glucose fasting 100-120, post *** 90-120. 12/04/2021  she is on metformin 1000 mg BID, Ozempic 0.5 mg, Terri [name] was reduced from 12 units to 9 units due to symptoms of hypoglycemia with blood glucose reading in 80's.  Since decrease in the Tresiba, only 2 episodes of blood glucose in 90's and felt okay.   Assessment/Plan:   1. Type 2 diabetes mellitus with other specified complication, without long-term current use of insulin ([facility]) Refill Ozempic 0.5 mg once weekly dispense 3 mL no refills. See below. Continue metformin 1000 mg BID, Tresiba 9 units. - Semaglutide,0.25 or 0.5MG /DOS, (OZEMPIC, 0.25 OR 0.5 MG/DOSE,) 2 MG/1.5ML SOPN; Inject 0.5 mg into the skin once a week.  Dispense: 3 mL; Refill: 0  2. Obesity, current BMI 39.4 Terri [name] is currently in the action stage of change. As such, her goal is to continue with weight loss efforts. She has agreed to the Category 2 Plan.   Exercise goals:  As is.  Behavioral modification strategies: increasing lean protein intake, decreasing simple carbohydrates, meal planning and cooking strategies, keeping healthy foods in the home, and planning for  success.  Terri [name] has agreed to follow-up with our clinic in 4 weeks. She was informed of the importance of frequent follow-up visits to maximize her success with intensive lifestyle modifications for her multiple health conditions.  Objective:   Blood pressure 124/79, pulse 86, temperature 98.4 F (36.9 C), height 5\' 6"  (1.676 m), weight 244 lb (110.7 kg), SpO2 99 %. Body mass index is 39.38 kg/m.  General: Cooperative, alert, well developed, in no acute distress. HEENT: Conjunctivae and lids unremarkable. Cardiovascular: Regular rhythm.  Lungs: Normal work of breathing. Neurologic: No focal deficits.   Lab Results  Component Value Date   CREATININE 0.65 11/12/2021   BUN 15 11/12/2021   NA 137 11/12/2021   K 4.5 11/12/2021   CL 99 11/12/2021   CO2 25 11/12/2021   Lab Results  Component Value Date   ALT 16 11/12/2021   AST 13 11/12/2021   ALKPHOS 51 11/12/2021   BILITOT 0.3 11/12/2021   Lab Results  Component Value Date   HGBA1C 6.4 (H) 11/12/2021   HGBA1C 7.3 (H) 06/27/2021   HGBA1C 7.4 (H) 02/28/2021   No results found for: INSULIN Lab Results  Component Value Date   TSH 0.909 08/22/2021   Lab Results  Component Value Date   CHOL 117 11/12/2021   HDL 58 11/12/2021   LDLCALC 43 11/12/2021   TRIG 77 11/12/2021   CHOLHDL 2.0 11/12/2021   Lab Results  Component Value Date   VD25OH 42.6 08/22/2021   Lab Results  Component Value Date   WBC 5.4 06/27/2021   HGB 11.8  06/27/2021   HCT 36.0 06/27/2021   MCV 90 06/27/2021   PLT 225 06/27/2021   No results found for: IRON, TIBC, FERRITIN  Attestation Statements:   Reviewed by clinician on day of visit: allergies, medications, problem list, medical history, surgical history, family history, social history, and previous encounter notes.  I, Davy Pique, RMA, am acting as Location manager for Mina Marble, NP.  I have reviewed the above documentation for accuracy and completeness, and I agree with the above.  -  ***

## 2022-01-15 ENCOUNTER — Other Ambulatory Visit: Payer: Self-pay | Admitting: Nurse Practitioner

## 2022-01-15 DIAGNOSIS — Z794 Long term (current) use of insulin: Secondary | ICD-10-CM

## 2022-01-20 NOTE — Progress Notes (Signed)
TeleHealth Visit:  This visit was completed with telemedicine (audio/video) technology. Terri [name] has verbally consented to this TeleHealth visit. The patient is located at home, the provider is located at home. The participants in this visit include the listed provider and patient. The visit was conducted today via MyChart video.  OBESITY Terri [name] is here to discuss her progress with her obesity treatment plan along with follow-up of her obesity related diagnoses.   Today's visit was # 9 Starting weight: 255 lbs Starting date: 08/22/2021 Weight at last in office visit: 244 lbs on 01/01/22 Total weight loss: 11 lbs at last in office visit on 01/01/22. Today's reported weight: 242 lbs   Nutrition Plan: the Category 3 Plan.  Hunger is well controlled. Cravings are moderately controlled. Craves fruit. Current exercise: none  Interim History: Terri [name] is now out of school for the summer.  She is an Advertising copywriter.  She had set goal to be 230 pounds by the end of June-we readjusted goal to 230 pounds by the end of July.  Discussed this would take very close adherence to the plan to lose 2 pounds per week. Second goal is to be under 200 by November. She is eating all of the food on the plan.  Denies intake of caloric beverages.  Assessment/Plan:  1. Type II Diabetes HgbA1c is at goal.  Last A1c was 6.4 on 11/12/2021, improved from 7.3 on 06/27/2021. CBGs: 119 this AM.  CBGs have ranged from 90-130. Episodes of hypoglycemia-  Had 1 blood sugar of 90, she felt symptomatic.  Was late eating due to a prolonged meeting. Medication(s): Tresiba 9 units nightly, metformin 500 mg twice daily, Ozempic 0.5 mg weekly.  Nausea has subsided.  Denies constipation. She stated she wanted to get off metformin because she was worried it would harm her kidneys.  We discussed that it is not harmful to kidneys but if a person has very low kidney function they cannot take metformin.  Her  last GFR was excellent at 105.  Lab Results  Component Value Date   HGBA1C 6.4 (H) 11/12/2021   HGBA1C 7.3 (H) 06/27/2021   HGBA1C 7.4 (H) 02/28/2021   Lab Results  Component Value Date   LDLCALC 43 11/12/2021   CREATININE 0.65 11/12/2021    Plan: Refill- Semaglutide,0.25 or 0.5MG /DOS, (OZEMPIC, 0.25 OR 0.5 MG/DOSE,) 2 MG/1.5ML SOPN   Sig: Inject 0.5 mg into the skin once a week.   Dispense:  3 mL   Refill:  0  Continue Ozempic at 0.5 mg weekly, metformin 500 mg twice daily, and Tresiba 9 units nightly.   2. Vitamin D Deficiency Vitamin D is slightly low at 42.  She is on over-the-counter vitamin D3 1000 IU daily.  Lab Results  Component Value Date   VD25OH 42.6 08/22/2021    Plan: Continue OTC vitamin D3 1000 IU daily.  3.  Hypertension associated with type 2 diabetes Hypertension well controlled.  Home blood pressures run 120s over 70s.  Overall hypertension is much better controlled than it was when she first started at our clinic. Medication(s): Valsartan-HCTZ 320-25 mg daily.  BP Readings from Last 3 Encounters:  01/01/22 124/79  12/10/21 128/81  11/11/21 (!) 147/76   Lab Results  Component Value Date   CREATININE 0.65 11/12/2021   CREATININE 0.73 06/27/2021   CREATININE 0.65 02/28/2021    Plan: Current treatment plan is effective, no change in therapy..   Obesity: Current BMI 39.4 Terri [name] is currently in the action stage  of change. As such, her goal is to continue with weight loss efforts.  She has agreed to the Category 3 Plan.   Exercise goals: She plans on starting back to the [employer]-exercises in the pool, treadmill on incline of 4, stomach crunch machine.  Encouraged her to add in some arm machines-biceps, triceps.  Behavioral modification strategies: increasing lean protein intake, decreasing simple carbohydrates, and planning for success.  Terri [name] has agreed to follow-up with our clinic in 4 weeks.   No orders of the defined types were placed  in this encounter.   Medications Discontinued During This Encounter  Medication Reason   Semaglutide,0.25 or 0.5MG /DOS, (OZEMPIC, 0.25 OR 0.5 MG/DOSE,) 2 MG/1.5ML SOPN Reorder     Meds ordered this encounter  Medications   Semaglutide,0.25 or 0.5MG /DOS, (OZEMPIC, 0.25 OR 0.5 MG/DOSE,) 2 MG/1.5ML SOPN    Sig: Inject 0.5 mg into the skin once a week.    Dispense:  3 mL    Refill:  0    Order Specific Question:   Supervising Provider    Answer:   Quillian Quince D [[id number]]      Objective:   VITALS: Per patient if applicable, see vitals. GENERAL: Alert and in no acute distress. CARDIOPULMONARY: No increased WOB. Speaking in clear sentences.  PSYCH: Pleasant and cooperative. Speech normal rate and rhythm. Affect is appropriate. Insight and judgement are appropriate. Attention is focused, linear, and appropriate.  NEURO: Oriented as arrived to appointment on time with no prompting.   Lab Results  Component Value Date   CREATININE 0.65 11/12/2021   BUN 15 11/12/2021   NA 137 11/12/2021   K 4.5 11/12/2021   CL 99 11/12/2021   CO2 25 11/12/2021   Lab Results  Component Value Date   ALT 16 11/12/2021   AST 13 11/12/2021   ALKPHOS 51 11/12/2021   BILITOT 0.3 11/12/2021   Lab Results  Component Value Date   HGBA1C 6.4 (H) 11/12/2021   HGBA1C 7.3 (H) 06/27/2021   HGBA1C 7.4 (H) 02/28/2021   No results found for: "INSULIN" Lab Results  Component Value Date   TSH 0.909 08/22/2021   Lab Results  Component Value Date   CHOL 117 11/12/2021   HDL 58 11/12/2021   LDLCALC 43 11/12/2021   TRIG 77 11/12/2021   CHOLHDL 2.0 11/12/2021   Lab Results  Component Value Date   WBC 5.4 06/27/2021   HGB 11.8 06/27/2021   HCT 36.0 06/27/2021   MCV 90 06/27/2021   PLT 225 06/27/2021   No results found for: "IRON", "TIBC", "FERRITIN" Lab Results  Component Value Date   VD25OH 42.6 08/22/2021    Attestation Statements:   Reviewed by clinician on day of visit: allergies,  medications, problem list, medical history, surgical history, family history, social history, and previous encounter notes.

## 2022-01-21 ENCOUNTER — Telehealth (INDEPENDENT_AMBULATORY_CARE_PROVIDER_SITE_OTHER): Payer: BC Managed Care – PPO | Admitting: Family Medicine

## 2022-01-21 ENCOUNTER — Encounter (INDEPENDENT_AMBULATORY_CARE_PROVIDER_SITE_OTHER): Payer: Self-pay | Admitting: Family Medicine

## 2022-01-21 DIAGNOSIS — E1159 Type 2 diabetes mellitus with other circulatory complications: Secondary | ICD-10-CM

## 2022-01-21 DIAGNOSIS — E669 Obesity, unspecified: Secondary | ICD-10-CM

## 2022-01-21 DIAGNOSIS — Z7985 Long-term (current) use of injectable non-insulin antidiabetic drugs: Secondary | ICD-10-CM

## 2022-01-21 DIAGNOSIS — I152 Hypertension secondary to endocrine disorders: Secondary | ICD-10-CM | POA: Diagnosis not present

## 2022-01-21 DIAGNOSIS — Z7984 Long term (current) use of oral hypoglycemic drugs: Secondary | ICD-10-CM

## 2022-01-21 DIAGNOSIS — E559 Vitamin D deficiency, unspecified: Secondary | ICD-10-CM | POA: Diagnosis not present

## 2022-01-21 DIAGNOSIS — Z6839 Body mass index (BMI) 39.0-39.9, adult: Secondary | ICD-10-CM

## 2022-01-21 DIAGNOSIS — E1169 Type 2 diabetes mellitus with other specified complication: Secondary | ICD-10-CM

## 2022-01-21 MED ORDER — VITAMIN D3 25 MCG (1000 UT) PO CAPS: 1000.0000 [IU] | ORAL_CAPSULE | Freq: Every day | ORAL | 0 refills | Status: DC

## 2022-01-21 MED ORDER — OZEMPIC (0.25 OR 0.5 MG/DOSE) 2 MG/1.5ML ~~LOC~~ SOPN: 0.5000 mg | PEN_INJECTOR | SUBCUTANEOUS | 0 refills | Status: DC

## 2022-02-18 ENCOUNTER — Encounter (INDEPENDENT_AMBULATORY_CARE_PROVIDER_SITE_OTHER): Payer: Self-pay | Admitting: Adult Health

## 2022-02-18 ENCOUNTER — Ambulatory Visit (INDEPENDENT_AMBULATORY_CARE_PROVIDER_SITE_OTHER): Payer: BC Managed Care – PPO | Admitting: Adult Health

## 2022-02-18 VITALS — BP 118/77 | HR 73 | Temp 97.7°F | Ht 66.0 in | Wt 242.0 lb

## 2022-02-18 DIAGNOSIS — E559 Vitamin D deficiency, unspecified: Secondary | ICD-10-CM

## 2022-02-18 DIAGNOSIS — E669 Obesity, unspecified: Secondary | ICD-10-CM

## 2022-02-18 DIAGNOSIS — Z6839 Body mass index (BMI) 39.0-39.9, adult: Secondary | ICD-10-CM

## 2022-02-18 DIAGNOSIS — E1169 Type 2 diabetes mellitus with other specified complication: Secondary | ICD-10-CM | POA: Diagnosis not present

## 2022-02-18 DIAGNOSIS — Z7985 Long-term (current) use of injectable non-insulin antidiabetic drugs: Secondary | ICD-10-CM

## 2022-02-18 MED ORDER — OZEMPIC (0.25 OR 0.5 MG/DOSE) 2 MG/1.5ML ~~LOC~~ SOPN: 0.5000 mg | PEN_INJECTOR | SUBCUTANEOUS | 0 refills | Status: DC

## 2022-02-18 NOTE — Progress Notes (Unsigned)
Chief Complaint:   OBESITY Terri [name] is here to discuss her progress with her obesity treatment plan along with follow-up of her obesity related diagnoses. Terri [name] is on the Category 3 Plan and states she is following her eating plan approximately 75-80% of the time. Terri [name] states she is doing cardio and swimming for 60-75 minutes 3 times per week.  Today's visit was #: 10 Starting weight: 255 lbs Starting date: 08/22/2021 Today's weight: 242 lbs Today's date: 02/18/2022 Total lbs lost to date: 13 Total lbs lost since last in-office visit: 2  Interim History: Terri [name] has a PCP appointment in August with labs.  Her PCP manages metformin and [place].  [facility] manages her Ozempic**.  She has decreased and her shirt size.  Subjective:   1. Type 2 diabetes mellitus with other specified complication, without long-term current use of insulin (HCC) Terri [name]'s home CBG readings fasting, are averaging between 90-110, postprandial 100-120's; to outline her 77, 77-no symptoms of hypoglycemia.  Last A1c was on 11/12/2021.  Her PCP manages metformin 500 mg twice daily with meals, and Tresiba 8 units nightly.  2. Vitamin D deficiency Terri [name] is on OTC vitamin D3 1000 units daily.  Her last vitamin D level was 42.6 on 08/22/2021, stable.  Assessment/Plan:   1. Type 2 diabetes mellitus with other specified complication, without long-term current use of insulin (HCC) Terri [name] will continue Ozempic 0.5 mg once weekly, and we will refill for 1 month.  - Semaglutide,0.25 or 0.5MG /DOS, (OZEMPIC, 0.25 OR 0.5 MG/DOSE,) 2 MG/1.5ML SOPN; Inject 0.5 mg into the skin once a week.  Dispense: 3 mL; Refill: 0  2. Vitamin D deficiency Terri [name] will continue OTC vitamin D3 1000 units daily.  We will recheck labs at her next office visit.  3. Obesity, current BMI 39.1 Terri [name] is currently in the action stage of change. As such, her goal is to continue with weight loss efforts. She has agreed to  the Category 3 Plan.   Recheck fasting labs at her next office visit.  Exercise goals: As is.   Behavioral modification strategies: increasing lean protein intake, decreasing simple carbohydrates, meal planning and cooking strategies, keeping healthy foods in the home, and planning for success.  Terri [name] has agreed to follow-up with our clinic in 3 weeks. She was informed of the importance of frequent follow-up visits to maximize her success with intensive lifestyle modifications for her multiple health conditions.   Objective:   Blood pressure 118/77, pulse 73, temperature 97.7 F (36.5 C), height 5\' 6"  (1.676 m), weight 242 lb (109.8 kg), SpO2 99 %. Body mass index is 39.06 kg/m.  General: Cooperative, alert, well developed, in no acute distress. HEENT: Conjunctivae and lids unremarkable. Cardiovascular: Regular rhythm.  Lungs: Normal work of breathing. Neurologic: No focal deficits.   Lab Results  Component Value Date   CREATININE 0.65 11/12/2021   BUN 15 11/12/2021   NA 137 11/12/2021   K 4.5 11/12/2021   CL 99 11/12/2021   CO2 25 11/12/2021   Lab Results  Component Value Date   ALT 16 11/12/2021   AST 13 11/12/2021   ALKPHOS 51 11/12/2021   BILITOT 0.3 11/12/2021   Lab Results  Component Value Date   HGBA1C 6.4 (H) 11/12/2021   HGBA1C 7.3 (H) 06/27/2021   HGBA1C 7.4 (H) 02/28/2021   No results found for: "INSULIN" Lab Results  Component Value Date   TSH 0.909 08/22/2021   Lab Results  Component Value Date   CHOL  117 11/12/2021   HDL 58 11/12/2021   LDLCALC 43 11/12/2021   TRIG 77 11/12/2021   CHOLHDL 2.0 11/12/2021   Lab Results  Component Value Date   VD25OH 42.6 08/22/2021   Lab Results  Component Value Date   WBC 5.4 06/27/2021   HGB 11.8 06/27/2021   HCT 36.0 06/27/2021   MCV 90 06/27/2021   PLT 225 06/27/2021   No results found for: "IRON", "TIBC", "FERRITIN"  Attestation Statements:   Reviewed by clinician on day of visit: allergies,  medications, problem list, medical history, surgical history, family history, social history, and previous encounter notes.  Trude Mcburney, am acting as transcriptionist for William Hamburger, NP.  I have reviewed the above documentation for accuracy and completeness, and I agree with the above. -  ***

## 2022-03-11 ENCOUNTER — Telehealth (INDEPENDENT_AMBULATORY_CARE_PROVIDER_SITE_OTHER): Payer: BC Managed Care – PPO | Admitting: Family Medicine

## 2022-03-11 ENCOUNTER — Encounter (INDEPENDENT_AMBULATORY_CARE_PROVIDER_SITE_OTHER): Payer: Self-pay | Admitting: Family Medicine

## 2022-03-11 DIAGNOSIS — E1169 Type 2 diabetes mellitus with other specified complication: Secondary | ICD-10-CM

## 2022-03-11 DIAGNOSIS — E1159 Type 2 diabetes mellitus with other circulatory complications: Secondary | ICD-10-CM | POA: Diagnosis not present

## 2022-03-11 DIAGNOSIS — Z6839 Body mass index (BMI) 39.0-39.9, adult: Secondary | ICD-10-CM

## 2022-03-11 DIAGNOSIS — Z7985 Long-term (current) use of injectable non-insulin antidiabetic drugs: Secondary | ICD-10-CM

## 2022-03-11 DIAGNOSIS — I152 Hypertension secondary to endocrine disorders: Secondary | ICD-10-CM

## 2022-03-11 DIAGNOSIS — Z794 Long term (current) use of insulin: Secondary | ICD-10-CM

## 2022-03-11 DIAGNOSIS — E669 Obesity, unspecified: Secondary | ICD-10-CM | POA: Diagnosis not present

## 2022-03-11 DIAGNOSIS — Z7984 Long term (current) use of oral hypoglycemic drugs: Secondary | ICD-10-CM

## 2022-03-11 NOTE — Progress Notes (Signed)
TeleHealth Visit:  This visit was completed with telemedicine (audio/video) technology. Terri [name] has verbally consented to this TeleHealth visit. The patient is located at home, the provider is located at home. The participants in this visit include the listed provider and patient. The visit was conducted today via MyChart video.  OBESITY Terri [name] is here to discuss her progress with her obesity treatment plan along with follow-up of her obesity related diagnoses.   Today's visit was # 11 Starting weight: 255 lbs Starting date: 08/22/2021 Weight at last in office visit: 242 lbs on 02/18/22 Total weight loss: 13 lbs at last in office visit on 02/18/22. Today's reported weight: 242 lbs   Nutrition Plan: the Category 3 Plan.  Hunger is well controlled.  Current exercise: cardio and swimming for 60 minutes 2-3 times per week.  Interim History: Terri [name] reports a few "cheat" days when she traveled recently.  However she has maintained her weight.   She is happy with the nonscale victory of needing smaller clothes sizes. She is going out of town this week for a few days but plans on sticking to category 3. She notes that she does not always feel like eating all of her breakfast in the morning.  She goes to the [facility] 2 -3 days/week and swims and walks on the treadmill.  When school starts she will exercise in the morning before school.  She has to be back in the classroom on August 17.  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   Assessment/Plan:  1. Type II Diabetes HgbA1c is at goal. Last A1c was 6.4 on 11/12/2021.  Labs are scheduled for next visit. LDL is at goal-43 on 11/12/2021.  On Crestor 10 mg daily. PCP manages diabetes.  CBGs: Fasting 109-120, Lunch- 109-125, Dinner - 90 -130 Episodes of hypoglycemia: no Medication(s): Tresiba 8 units daily, metformin 1000 mg twice daily, Ozempic 0.5 mg weekly.  Lab Results  Component Value Date   HGBA1C 6.4 (H) 11/12/2021    HGBA1C 7.3 (H) 06/27/2021   HGBA1C 7.4 (H) 02/28/2021   Lab Results  Component Value Date   LDLCALC 43 11/12/2021   CREATININE 0.65 11/12/2021    Plan: Continue Tresiba 8 units daily, metformin 1000 mg twice daily, Ozempic 0.5 mg weekly. Follow-up with PCP.   2. Hypertension associated with type 2 diabetes Hypertension well controlled.  BP 130/85 at home this morning.  Last 3 office visits blood pressures have been excellent. Medication(s): Amlodipine 5 mg daily, valsartan-HCTZ 320-25 mg daily.  BP Readings from Last 3 Encounters:  02/18/22 118/77  01/01/22 124/79  12/10/21 128/81   Lab Results  Component Value Date   CREATININE 0.65 11/12/2021   CREATININE 0.73 06/27/2021   CREATININE 0.65 02/28/2021    Plan: Continue all medications at current dosages.   3. Obesity: Current BMI 39.1 Terri [name] is currently in the action stage of change. As such, her goal is to continue with weight loss efforts.  She has agreed to the Category 3 Plan.   Encouraged her to move breakfast food to later in the day since she does not feeling like eating all of it in the morning.  Exercise goals: cardio and swimming for 60 minutes 3 times per week.  Behavioral modification strategies: increasing lean protein intake, no skipping meals, meal planning and cooking strategies, and travel eating strategies.  Terri [name] has agreed to follow-up with our clinic in 2 weeks.   No orders of the defined types were placed in this encounter.  There are no discontinued medications.   No orders of the defined types were placed in this encounter.     Objective:   VITALS: Per patient if applicable, see vitals. GENERAL: Alert and in no acute distress. CARDIOPULMONARY: No increased WOB. Speaking in clear sentences.  PSYCH: Pleasant and cooperative. Speech normal rate and rhythm. Affect is appropriate. Insight and judgement are appropriate. Attention is focused, linear, and appropriate.  NEURO: Oriented  as arrived to appointment on time with no prompting.   Lab Results  Component Value Date   CREATININE 0.65 11/12/2021   BUN 15 11/12/2021   NA 137 11/12/2021   K 4.5 11/12/2021   CL 99 11/12/2021   CO2 25 11/12/2021   Lab Results  Component Value Date   ALT 16 11/12/2021   AST 13 11/12/2021   ALKPHOS 51 11/12/2021   BILITOT 0.3 11/12/2021   Lab Results  Component Value Date   HGBA1C 6.4 (H) 11/12/2021   HGBA1C 7.3 (H) 06/27/2021   HGBA1C 7.4 (H) 02/28/2021   No results found for: "INSULIN" Lab Results  Component Value Date   TSH 0.909 08/22/2021   Lab Results  Component Value Date   CHOL 117 11/12/2021   HDL 58 11/12/2021   LDLCALC 43 11/12/2021   TRIG 77 11/12/2021   CHOLHDL 2.0 11/12/2021   Lab Results  Component Value Date   WBC 5.4 06/27/2021   HGB 11.8 06/27/2021   HCT 36.0 06/27/2021   MCV 90 06/27/2021   PLT 225 06/27/2021   No results found for: "IRON", "TIBC", "FERRITIN" Lab Results  Component Value Date   VD25OH 42.6 08/22/2021    Attestation Statements:   Reviewed by clinician on day of visit: allergies, medications, problem list, medical history, surgical history, family history, social history, and previous encounter notes.  Time spent on visit including the items listed below was 32 minutes.  -preparing to see the patient (e.g., review of tests, history, previous notes) -obtaining and/or reviewing separately obtained history -counseling and educating the patient/family/caregiver -documenting clinical information in the electronic or other health record

## 2022-03-13 ENCOUNTER — Other Ambulatory Visit (HOSPITAL_COMMUNITY): Payer: Self-pay | Admitting: Nurse Practitioner

## 2022-03-13 DIAGNOSIS — Z1231 Encounter for screening mammogram for malignant neoplasm of breast: Secondary | ICD-10-CM

## 2022-03-19 ENCOUNTER — Encounter (INDEPENDENT_AMBULATORY_CARE_PROVIDER_SITE_OTHER): Payer: Self-pay

## 2022-03-19 LAB — CMP14+EGFR
ALT: 11 IU/L (ref 0–32)
AST: 12 IU/L (ref 0–40)
Albumin/Globulin Ratio: 1.8 (ref 1.2–2.2)
Albumin: 4.6 g/dL (ref 3.8–4.9)
Alkaline Phosphatase: 47 IU/L (ref 44–121)
BUN/Creatinine Ratio: 22 (ref 9–23)
BUN: 16 mg/dL (ref 6–24)
Bilirubin Total: 0.3 mg/dL (ref 0.0–1.2)
CO2: 25 mmol/L (ref 20–29)
Calcium: 9.4 mg/dL (ref 8.7–10.2)
Chloride: 100 mmol/L (ref 96–106)
Creatinine, Ser: 0.73 mg/dL (ref 0.57–1.00)
Globulin, Total: 2.5 g/dL (ref 1.5–4.5)
Glucose: 99 mg/dL (ref 70–99)
Potassium: 4.6 mmol/L (ref 3.5–5.2)
Sodium: 140 mmol/L (ref 134–144)
Total Protein: 7.1 g/dL (ref 6.0–8.5)
eGFR: 97 mL/min/{1.73_m2} (ref >59)

## 2022-03-19 LAB — CBC WITH DIFFERENTIAL/PLATELET
Basophils Absolute: 0 10*3/uL (ref 0.0–0.2)
Basos: 0 %
EOS (ABSOLUTE): 0.2 10*3/uL (ref 0.0–0.4)
Eos: 4 %
Hematocrit: 35 % (ref 34.0–46.6)
Hemoglobin: 11.7 g/dL (ref 11.1–15.9)
Immature Grans (Abs): 0 10*3/uL (ref 0.0–0.1)
Immature Granulocytes: 0 %
Lymphocytes Absolute: 2.4 10*3/uL (ref 0.7–3.1)
Lymphs: 50 %
MCH: 29.4 pg (ref 26.6–33.0)
MCHC: 33.4 g/dL (ref 31.5–35.7)
MCV: 88 fL (ref 79–97)
Monocytes Absolute: 0.4 10*3/uL (ref 0.1–0.9)
Monocytes: 9 %
Neutrophils Absolute: 1.8 10*3/uL (ref 1.4–7.0)
Neutrophils: 37 %
Platelets: 209 10*3/uL (ref 150–450)
RBC: 3.98 x10E6/uL (ref 3.77–5.28)
RDW: 12.4 % (ref 11.7–15.4)
WBC: 4.8 10*3/uL (ref 3.4–10.8)

## 2022-03-19 LAB — TSH: TSH: 1.65 u[IU]/mL (ref 0.450–4.500)

## 2022-03-19 LAB — HEMOGLOBIN A1C
Est. average glucose Bld gHb Est-mCnc: 131 mg/dL
Hgb A1c MFr Bld: 6.2 % — ABNORMAL HIGH (ref 4.8–5.6)

## 2022-03-19 LAB — VITAMIN D 25 HYDROXY (VIT D DEFICIENCY, FRACTURES): Vit D, 25-Hydroxy: 48.3 ng/mL (ref 30.0–100.0)

## 2022-03-19 LAB — LIPID PANEL
Chol/HDL Ratio: 2.2 ratio (ref 0.0–4.4)
Cholesterol, Total: 129 mg/dL (ref 100–199)
HDL: 58 mg/dL (ref >39)
LDL Chol Calc (NIH): 53 mg/dL (ref 0–99)
Triglycerides: 98 mg/dL (ref 0–149)
VLDL Cholesterol Cal: 18 mg/dL (ref 5–40)

## 2022-03-19 NOTE — Progress Notes (Signed)
Labs are stable, continue current medications

## 2022-03-26 ENCOUNTER — Other Ambulatory Visit: Payer: Self-pay

## 2022-03-26 ENCOUNTER — Ambulatory Visit (INDEPENDENT_AMBULATORY_CARE_PROVIDER_SITE_OTHER): Payer: BC Managed Care – PPO | Admitting: Family Medicine

## 2022-03-26 ENCOUNTER — Encounter (INDEPENDENT_AMBULATORY_CARE_PROVIDER_SITE_OTHER): Payer: Self-pay | Admitting: Family Medicine

## 2022-03-26 ENCOUNTER — Telehealth (INDEPENDENT_AMBULATORY_CARE_PROVIDER_SITE_OTHER): Payer: BC Managed Care – PPO | Admitting: Family Medicine

## 2022-03-26 ENCOUNTER — Telehealth: Payer: Self-pay | Admitting: Nurse Practitioner

## 2022-03-26 VITALS — BP 128/79 | HR 89 | Temp 98.1°F | Ht 66.0 in | Wt 240.0 lb

## 2022-03-26 DIAGNOSIS — E785 Hyperlipidemia, unspecified: Secondary | ICD-10-CM | POA: Diagnosis not present

## 2022-03-26 DIAGNOSIS — E559 Vitamin D deficiency, unspecified: Secondary | ICD-10-CM

## 2022-03-26 DIAGNOSIS — E669 Obesity, unspecified: Secondary | ICD-10-CM | POA: Diagnosis not present

## 2022-03-26 DIAGNOSIS — Z7985 Long-term (current) use of injectable non-insulin antidiabetic drugs: Secondary | ICD-10-CM

## 2022-03-26 DIAGNOSIS — Z6838 Body mass index (BMI) 38.0-38.9, adult: Secondary | ICD-10-CM

## 2022-03-26 DIAGNOSIS — E1169 Type 2 diabetes mellitus with other specified complication: Secondary | ICD-10-CM

## 2022-03-26 DIAGNOSIS — Z794 Long term (current) use of insulin: Secondary | ICD-10-CM

## 2022-03-26 DIAGNOSIS — Z7984 Long term (current) use of oral hypoglycemic drugs: Secondary | ICD-10-CM

## 2022-03-26 MED ORDER — UNABLE TO FIND: 3 refills | Status: DC

## 2022-03-26 MED ORDER — TRESIBA FLEXTOUCH 200 UNIT/ML ~~LOC~~ SOPN: 4.0000 [IU] | PEN_INJECTOR | Freq: Every day | SUBCUTANEOUS | 1 refills | Status: DC

## 2022-03-26 MED ORDER — SEMAGLUTIDE (1 MG/DOSE) 4 MG/3ML ~~LOC~~ SOPN: 1.0000 mg | PEN_INJECTOR | SUBCUTANEOUS | 0 refills | Status: DC

## 2022-03-26 NOTE — Telephone Encounter (Signed)
Rx sent 

## 2022-03-26 NOTE — Telephone Encounter (Signed)
Pt came into office stating she got a new glucose monitor & is needing the testing strips & lancets. Can you please send these in for her?   Onetouch Verio Reflect meter is what she has.

## 2022-03-30 NOTE — Progress Notes (Signed)
Chief Complaint:   OBESITY Terri [name] is here to discuss her progress with her obesity treatment plan along with follow-up of her obesity related diagnoses. Terri [name] is on the Category 3 Plan and states she is following her eating plan approximately 70-75% of the time. Terri [name] states she is walking 30 minutes 1-2 times per week.  Today's visit was #: 12 Starting weight: 255 lbs Starting date: 08/22/2021 Today's weight: 240 lbs Today's date: 03/26/2022 Total lbs lost to date: 15 Total lbs lost since last in-office visit: 2  Interim History: Terri [name] reports things are gong well, and she has no issues. She has some increased snacking and increased desires after dinner. Pt is not measuring proteins or snacks and vegetables at dinner. She had blood work done 8 days ago and all were reviewed in detail today (CBC, CMP, A1c, Vit D, FLP).  Subjective:   1. Type 2 diabetes mellitus with other specified complication, without long-term current use of insulin (HCC) Discussed labs with patient today. A1c is 6.2 on labs done with PCP 8 days ago. Pt started Ozempic on 10/10/2021. She has been on 0.5 mg for 2 months and is tolerating it well with no side effects. Medication: Tresiba, Metformin, Ozempic  2. Hyperlipidemia due to type 2 diabetes mellitus (HCC) Discussed labs with patient today.Cleo has hyperlipidemia and has been trying to improve her cholesterol levels with intensive lifestyle modifications including a low saturated fat diet, exercise, and weight loss. She denies any chest pain, claudication, or myalgias. Medication: Crestor  3. Vitamin D deficiency Discussed labs with patient today. She is currently taking OTC vitamin D 1000 IU each day. She denies nausea, vomiting or muscle weakness.  Assessment/Plan:  No orders of the defined types were placed in this encounter.   Medications Discontinued During This Encounter  Medication Reason   Semaglutide,0.25 or 0.5MG /DOS, (OZEMPIC, 0.25  OR 0.5 MG/DOSE,) 2 MG/1.5ML SOPN    insulin degludec (TRESIBA FLEXTOUCH) 200 UNIT/ML FlexTouch Pen      Meds ordered this encounter  Medications   insulin degludec (TRESIBA FLEXTOUCH) 200 UNIT/ML FlexTouch Pen    Sig: Inject 4 Units into the skin at bedtime.    Dispense:  15 mL    Refill:  1   Semaglutide, 1 MG/DOSE, 4 MG/3ML SOPN    Sig: Inject 1 mg into the skin once a week.    Dispense:  3 mL    Refill:  0     1. Type 2 diabetes mellitus with other specified complication, without long-term current use of insulin (HCC) Good blood sugar control is important to decrease the likelihood of diabetic complications such as nephropathy, neuropathy, limb loss, blindness, coronary artery disease, and death. Intensive lifestyle modification including diet, exercise and weight loss are the first line of treatment for diabetes.  Decrease Tresiba to 4 units. Increase Ozempic to 1 mg weekly. Continue Metformin.  Decrease & Refill- insulin degludec (TRESIBA FLEXTOUCH) 200 UNIT/ML FlexTouch Pen; Inject 4 Units into the skin at bedtime.  Dispense: 15 mL; Refill: 1 Increase & Refill- Semaglutide, 1 MG/DOSE, 4 MG/3ML SOPN; Inject 1 mg into the skin once a week.  Dispense: 3 mL; Refill: 0  2. Hyperlipidemia due to type 2 diabetes mellitus [facility]) Cardiovascular risk and specific lipid/LDL goals reviewed.  We discussed several lifestyle modifications today and Terri [name] will continue to work on diet, exercise and weight loss efforts. Orders and follow up as documented in patient record. CMP and FLP within normal limits. LDL is 53.  Pt advised to discuss with her PCP, if she agrees pt can decrease Crestor to 5 mg. Continue prudent nutritional plan. Extensive counseling done.  Counseling Intensive lifestyle modifications are the first line treatment for this issue. Dietary changes: Increase soluble fiber. Decrease simple carbohydrates. Exercise changes: Moderate to vigorous-intensity aerobic activity 150 minutes  per week if tolerated. Lipid-lowering medications: see documented in medical record.  3. Vitamin D deficiency Vit D level done 8 days ago at 483. Low Vitamin D level contributes to fatigue and are associated with obesity, breast, and colon cancer. She agrees to continue OTC Vitamin D 1,000 IU daily and will follow-up for routine testing of Vitamin D, at least 2-3 times per year to avoid over-replacement.  4. Obesity, current BMI 38.7 Keanna is currently in the action stage of change. As such, her goal is to continue with weight loss efforts. She has agreed to the Category 2 Plan with breakfast and lunch options.   Measure and weigh proteins and vegetables. Follow category 2 meal plan.  Exercise goals:  As is  Behavioral modification strategies: increasing lean protein intake and planning for success.  Terri [name] has agreed to follow-up with our clinic in 2 weeks with FNP Dawn. She was informed of the importance of frequent follow-up visits to maximize her success with intensive lifestyle modifications for her multiple health conditions.   Objective:   Blood pressure 128/79, pulse 89, temperature 98.1 F (36.7 C), height 5\' 6"  (1.676 m), weight 240 lb (108.9 kg), SpO2 99 %. Body mass index is 38.74 kg/m.  General: Cooperative, alert, well developed, in no acute distress. HEENT: Conjunctivae and lids unremarkable. Cardiovascular: Regular rhythm.  Lungs: Normal work of breathing. Neurologic: No focal deficits.   Lab Results  Component Value Date   CREATININE 0.73 03/18/2022   BUN 16 03/18/2022   NA 140 03/18/2022   K 4.6 03/18/2022   CL 100 03/18/2022   CO2 25 03/18/2022   Lab Results  Component Value Date   ALT 11 03/18/2022   AST 12 03/18/2022   ALKPHOS 47 03/18/2022   BILITOT 0.3 03/18/2022   Lab Results  Component Value Date   HGBA1C 6.2 (H) 03/18/2022   HGBA1C 6.4 (H) 11/12/2021   HGBA1C 7.3 (H) 06/27/2021   HGBA1C 7.4 (H) 02/28/2021   No results found for:  "INSULIN" Lab Results  Component Value Date   TSH 1.650 03/18/2022   Lab Results  Component Value Date   CHOL 129 03/18/2022   HDL 58 03/18/2022   LDLCALC 53 03/18/2022   TRIG 98 03/18/2022   CHOLHDL 2.2 03/18/2022   Lab Results  Component Value Date   VD25OH 48.3 03/18/2022   VD25OH 42.6 08/22/2021   Lab Results  Component Value Date   WBC 4.8 03/18/2022   HGB 11.7 03/18/2022   HCT 35.0 03/18/2022   MCV 88 03/18/2022   PLT 209 03/18/2022     Attestation Statements:   Reviewed by clinician on day of visit: allergies, medications, problem list, medical history, surgical history, family history, social history, and previous encounter notes.  Time spent on visit including pre-visit chart review and post-visit care and charting was 42 minutes.   I, 05/18/2022, BS, CMA, am acting as transcriptionist for Kyung Rudd, DO.   I have reviewed the above documentation for accuracy and completeness, and I agree with the above. [employer], D.O.  The 21st Century Cures Act was signed into law in 2016 which includes the topic of electronic health records.  This provides immediate access to information in MyChart.  This includes consultation notes, operative notes, office notes, lab results and pathology reports.  If you have any questions about what you read please let us know at your next visit so we can discuss your concerns and take corrective action if need be.  We are right here with you.

## 2022-03-31 ENCOUNTER — Ambulatory Visit (HOSPITAL_COMMUNITY)
Admission: RE | Admit: 2022-03-31 | Discharge: 2022-03-31 | Disposition: A | Discharge status: Home / Self Care | Payer: BC Managed Care – PPO | Source: Ambulatory Visit | Attending: Nurse Practitioner | Admitting: Nurse Practitioner

## 2022-03-31 DIAGNOSIS — Z1231 Encounter for screening mammogram for malignant neoplasm of breast: Secondary | ICD-10-CM | POA: Insufficient documentation

## 2022-04-08 ENCOUNTER — Other Ambulatory Visit: Payer: Self-pay

## 2022-04-08 DIAGNOSIS — I10 Essential (primary) hypertension: Secondary | ICD-10-CM

## 2022-04-08 MED ORDER — AMLODIPINE BESYLATE 5 MG PO TABS: 5.0000 mg | ORAL_TABLET | Freq: Every day | ORAL | 3 refills | Status: DC

## 2022-04-08 NOTE — Progress Notes (Signed)
TeleHealth Visit:  This visit was completed with telemedicine (audio/video) technology. Terri [name] has verbally consented to this TeleHealth visit. The patient is located at home, the provider is located at home. The participants in this visit include the listed provider and patient. The visit was conducted today via MyChart video.  OBESITY Terri [name] is here to discuss her progress with her obesity treatment plan along with follow-up of her obesity related diagnoses.   Today's visit was # 13 Starting weight: 255 lbs Starting date: 08/22/2021 Weight at last in office visit: 240 lbs on 03/26/22 Total weight loss: 15 lbs at last in office visit on 03/26/22. Today's reported weight: 241 lbs   Nutrition Plan: the Category 2 Plan with breakfast/lunch options-90% compliance  Current exercise: 30 minutes on the treadmill/30 minutes in the pool 3 times per week.  Interim History: Terri [name] has been very focused on exercise and adhering as closely as she can to the plan. She is not hungry in the morning and has been skipping breakfast.  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  She does get a break in the morning. Packing her lunch and weighing the meat to get 4 ounces. She is trying to cook more in the evenings but this can be a challenge when working full-time.  Assessment/Plan:  1. Type II Diabetes HgbA1c is at goal. Last A1c was 6.2 CBGs: Fasting 117-123, after dinner 100  Episodes of hypoglycemia: no Medication(s): Ozempic 1 mg weekly (dose increased last OV), metformin 500 mg twice daily with meals, Tresiba 4 units once daily.  She is tolerating the increased dose of Ozempic well.  Definitely notices a difference in appetite.  Lab Results  Component Value Date   HGBA1C 6.2 (H) 03/18/2022   HGBA1C 6.4 (H) 11/12/2021   HGBA1C 7.3 (H) 06/27/2021   Lab Results  Component Value Date   LDLCALC 53 03/18/2022   CREATININE 0.73 03/18/2022     Plan: Continue Ozempic 1 mg weekly, metformin 500 mg twice daily with meals, Tresiba 4 units daily.  Advised her to let us know if she has any low blood sugars. In light of recent A1c, we will likely be able to discontinue the [place] soon.  2.  Hyperlipidemia associated with type 2 diabetes LDL is at goal (below 70).  Last LDL was 53 on 03/18/2022.  HDL and triglycerides are normal. Medication(s): Crestor 10 mg daily. Cardiovascular risk factors: diabetes mellitus, dyslipidemia, hypertension, and obesity (BMI >= 30 kg/m2)  Lab Results  Component Value Date   CHOL 129 03/18/2022   HDL 58 03/18/2022   LDLCALC 53 03/18/2022   TRIG 98 03/18/2022   CHOLHDL 2.2 03/18/2022   Lab Results  Component Value Date   ALT 11 03/18/2022   AST 12 03/18/2022   ALKPHOS 47 03/18/2022   BILITOT 0.3 03/18/2022   The ASCVD Risk score (Arnett DK, et al., 2019) failed to calculate for the following reasons:   The valid total cholesterol range is 130 to 320 mg/dL  Plan: Continue Crestor 10 mg daily. Continue regular exercise and meal plan. She will discuss possibly lowering Crestor dose to 5 mg with PCP.  Her current PCP left the practice and she will start be starting with a new PCP-Dr. Durwin Nora.  3. Obesity: Current BMI 38.7 Terri [name] is currently in the action stage of change. As such, her goal is to continue with weight loss efforts.  She has agreed to the Category 2 Plan and keeping a food  journal and adhering to recommended goals of 400-500 calories and 35 gms protein at dinner.  She also has breakfast and lunch options.  1.  May have a protein bar or protein shake in the morning if unable to eat breakfast.  The bar or shake should have least 20 g of protein and be less than 300 cal. 2.  Recipes plus recipe for egg muffins sent via MyChart. 3.  May have [employer] delight sandwich or egg bites from [employer].  Try to get as close to 20 g of protein as possible. 4.  Discussed ideas for dinners  that she can cook easily with leftovers.  Exercise goals: as is  Behavioral modification strategies: increasing lean protein intake, decreasing simple carbohydrates, no skipping meals, meal planning and cooking strategies, and planning for success.  Terri [name] has agreed to follow-up with our clinic in 2 weeks.   No orders of the defined types were placed in this encounter.   There are no discontinued medications.   No orders of the defined types were placed in this encounter.     Objective:   VITALS: Per patient if applicable, see vitals. GENERAL: Alert and in no acute distress. CARDIOPULMONARY: No increased WOB. Speaking in clear sentences.  PSYCH: Pleasant and cooperative. Speech normal rate and rhythm. Affect is appropriate. Insight and judgement are appropriate. Attention is focused, linear, and appropriate.  NEURO: Oriented as arrived to appointment on time with no prompting.   Lab Results  Component Value Date   CREATININE 0.73 03/18/2022   BUN 16 03/18/2022   NA 140 03/18/2022   K 4.6 03/18/2022   CL 100 03/18/2022   CO2 25 03/18/2022   Lab Results  Component Value Date   ALT 11 03/18/2022   AST 12 03/18/2022   ALKPHOS 47 03/18/2022   BILITOT 0.3 03/18/2022   Lab Results  Component Value Date   HGBA1C 6.2 (H) 03/18/2022   HGBA1C 6.4 (H) 11/12/2021   HGBA1C 7.3 (H) 06/27/2021   HGBA1C 7.4 (H) 02/28/2021   No results found for: "INSULIN" Lab Results  Component Value Date   TSH 1.650 03/18/2022   Lab Results  Component Value Date   CHOL 129 03/18/2022   HDL 58 03/18/2022   LDLCALC 53 03/18/2022   TRIG 98 03/18/2022   CHOLHDL 2.2 03/18/2022   Lab Results  Component Value Date   WBC 4.8 03/18/2022   HGB 11.7 03/18/2022   HCT 35.0 03/18/2022   MCV 88 03/18/2022   PLT 209 03/18/2022   No results found for: "IRON", "TIBC", "FERRITIN" Lab Results  Component Value Date   VD25OH 48.3 03/18/2022   VD25OH 42.6 08/22/2021    Attestation Statements:    Reviewed by clinician on day of visit: allergies, medications, problem list, medical history, surgical history, family history, social history, and previous encounter notes.  Time spent on visit including the items listed below was 33 minutes.  -preparing to see the patient (e.g., review of tests, history, previous notes) -obtaining and/or reviewing separately obtained history -counseling and educating the patient/family/caregiver -documenting clinical information in the electronic or other health record

## 2022-04-09 ENCOUNTER — Telehealth (INDEPENDENT_AMBULATORY_CARE_PROVIDER_SITE_OTHER): Payer: BC Managed Care – PPO | Admitting: Family Medicine

## 2022-04-09 ENCOUNTER — Encounter (INDEPENDENT_AMBULATORY_CARE_PROVIDER_SITE_OTHER): Payer: Self-pay | Admitting: Family Medicine

## 2022-04-09 DIAGNOSIS — E785 Hyperlipidemia, unspecified: Secondary | ICD-10-CM

## 2022-04-09 DIAGNOSIS — E669 Obesity, unspecified: Secondary | ICD-10-CM

## 2022-04-09 DIAGNOSIS — E1169 Type 2 diabetes mellitus with other specified complication: Secondary | ICD-10-CM

## 2022-04-09 DIAGNOSIS — Z6838 Body mass index (BMI) 38.0-38.9, adult: Secondary | ICD-10-CM

## 2022-04-14 ENCOUNTER — Encounter: Payer: Self-pay | Admitting: Internal Medicine

## 2022-04-15 ENCOUNTER — Other Ambulatory Visit: Payer: Self-pay

## 2022-04-15 ENCOUNTER — Other Ambulatory Visit: Payer: Self-pay | Admitting: Nurse Practitioner

## 2022-04-15 DIAGNOSIS — Z794 Long term (current) use of insulin: Secondary | ICD-10-CM

## 2022-04-15 DIAGNOSIS — E1165 Type 2 diabetes mellitus with hyperglycemia: Secondary | ICD-10-CM

## 2022-04-15 MED ORDER — GLUCOSE BLOOD VI STRP: ORAL_STRIP | 12 refills | Status: DC

## 2022-04-18 ENCOUNTER — Ambulatory Visit (INDEPENDENT_AMBULATORY_CARE_PROVIDER_SITE_OTHER): Payer: BC Managed Care – PPO | Admitting: Internal Medicine

## 2022-04-18 ENCOUNTER — Encounter: Payer: Self-pay | Admitting: Internal Medicine

## 2022-04-18 ENCOUNTER — Encounter: Payer: BC Managed Care – PPO | Admitting: Nurse Practitioner

## 2022-04-18 VITALS — BP 118/70 | HR 83 | Ht 66.0 in | Wt 245.0 lb

## 2022-04-18 DIAGNOSIS — Z0000 Encounter for general adult medical examination without abnormal findings: Secondary | ICD-10-CM | POA: Diagnosis not present

## 2022-04-18 DIAGNOSIS — I152 Hypertension secondary to endocrine disorders: Secondary | ICD-10-CM

## 2022-04-18 DIAGNOSIS — E1159 Type 2 diabetes mellitus with other circulatory complications: Secondary | ICD-10-CM | POA: Diagnosis not present

## 2022-04-18 DIAGNOSIS — Z23 Encounter for immunization: Secondary | ICD-10-CM

## 2022-04-18 DIAGNOSIS — Z2821 Immunization not carried out because of patient refusal: Secondary | ICD-10-CM

## 2022-04-18 NOTE — Progress Notes (Unsigned)
Complete physical exam  Patient: Terri [name]   DOB: Sep 06, 1965   56 y.o. Female  MRN: [id number]  Subjective:    Chief Complaint  Patient presents with   Annual Exam    Blood work   Terri [name] is a 56 y.o. female who presents today for a complete physical exam. She reports consuming a diabetic, 1500 calorie diet. Gym/ health club routine includes swimming and treadmill. She generally feels well. She reports sleeping poorly. She does have additional problems to discuss today, namely wanting to review her labs from last month and to discuss whether or not she needs to continue taking [place].  Most recent fall risk assessment:    04/18/2022    2:43 PM  Fall Risk   Falls in the past year? 0  Number falls in past yr: 0  Injury with Fall? 0  Risk for fall due to : No Fall Risks  Follow up Falls evaluation completed     Most recent depression screenings:    04/18/2022    2:44 PM 12/10/2021    4:25 PM  PHQ 2/9 Scores  PHQ - 2 Score 0 0    {VISON DENTAL STD PSA (Optional):27386}  {History (Optional):23778}  Patient Care Team: Donell Beers, FNP as PCP - General (Nurse Practitioner)   Outpatient Medications Prior to Visit  Medication Sig   amLODipine (NORVASC) 5 MG tablet Take 1 tablet (5 mg total) by mouth daily.   Cholecalciferol (VITAMIN D3) 25 MCG (1000 UT) CAPS Take 1 capsule (1,000 Units total) by mouth daily.   glucose blood test strip 3 times a day testing3 times a day testing   insulin degludec (TRESIBA FLEXTOUCH) 200 UNIT/ML FlexTouch Pen Inject 4 Units into the skin at bedtime.   metFORMIN (GLUCOPHAGE) 500 MG tablet TAKE 2 TABLETS TWICE A DAY WITH MEALS   rosuvastatin (CRESTOR) 10 MG tablet Take 1 tablet (10 mg total) by mouth daily.   Semaglutide, 1 MG/DOSE, 4 MG/3ML SOPN Inject 1 mg into the skin once a week.   UNABLE TO FIND Blood glucose test strips to check blood glucose twice daily DX: E11.9   UNABLE TO FIND Lancets used to check blood  glucose twice daily DX: E11.9   valsartan-hydrochlorothiazide (DIOVAN-HCT) 320-25 MG tablet Take 1 tablet by mouth daily.   No facility-administered medications prior to visit.    ROS        Objective:     BP 118/70   Pulse 83   Ht 5\' 6"  (1.676 m)   Wt 245 lb (111.1 kg)   SpO2 99%   BMI 39.54 kg/m  {Vitals History (Optional):23777}  Physical Exam   No results found for any visits on 04/18/22. {Show previous labs (optional):23779}    Assessment & Plan:    Routine Health Maintenance and Physical Exam  Immunization History  Administered Date(s) Administered   Influenza,inj,Quad PF,6+ Mos 04/22/2021   PFIZER(Purple Top)SARS-COV-2 Vaccination 10/08/2019, 11/01/2019, 05/05/2020, 11/20/2020, 05/18/2021   Zoster Recombinat (Shingrix) 08/09/2020, 10/04/2020    Health Maintenance  Topic Date Due   TETANUS/TDAP  Never done   COVID-19 Vaccine (6 - Pfizer series) 07/13/2021   INFLUENZA VACCINE  03/11/2022   HEMOGLOBIN A1C  09/18/2022   FOOT EXAM  12/11/2022   OPHTHALMOLOGY EXAM  02/20/2023   Fecal DNA (Cologuard)  03/22/2024   MAMMOGRAM  03/31/2024   Hepatitis C Screening  Completed   HIV Screening  Completed   Zoster Vaccines- Shingrix  Completed   HPV VACCINES  Aged Out   PAP SMEAR-Modifier  Discontinued    Discussed health benefits of physical activity, and encouraged her to engage in regular exercise appropriate for her age and condition.  Problem List Items Addressed This Visit   None  No follow-ups on file.     Terri Lade, MD

## 2022-04-18 NOTE — Patient Instructions (Signed)
It was a pleasure to see you today.  Thank you for giving [place] the opportunity to be involved in your care.  Below is a brief recap of your visit and next steps.  We will plan to see you again in 4 weeks  Summary Congratulations on all of your progress. You are doing quite well. I am in agreement with stopping [place] today. Continue to check your blood sugar each morning.  Next steps Follow up in 4 weeks Please find the attached stretching exercises.

## 2022-04-22 ENCOUNTER — Encounter: Payer: Self-pay | Admitting: Internal Medicine

## 2022-04-22 NOTE — Assessment & Plan Note (Signed)
HgbA1c 6.2 last month.  She is currently prescribed metformin 1000 mg twice daily, Ozempic, and Tresiba 4 units nightly.  She is checking her blood sugar every morning and fasting readings have consistently been 100.  She would like to stop taking Tresiba nightly since she is only taking 4 units and her blood sugars have been well controlled. -Through shared decision making, Terri [name] will stop taking Tresiba 4 units nightly and continue to closely monitor her blood pressures.  We discussed that if her blood sugars are consistently elevated, particularly greater than 130 fasting readings in the morning, she will resume [place]. -We will plan for follow-up in 4 weeks for reassessment

## 2022-04-22 NOTE — Assessment & Plan Note (Signed)
BP 118/70 today.  Well-controlled on current regimen of amlodipine and valsartan-HCTZ.  No changes today

## 2022-04-22 NOTE — Assessment & Plan Note (Addendum)
Seen today for annual physical exam.   -Recent labs reviewed with the patient.   -Flu shot administered today.  She declined Tdap.  -Additional preventative healthcare maintenance items are up-to-date. -Terri [name] has made significant lifestyle modifications recently and is losing weight.  She has changed her diet and is going to the [facility] for exercise classes regularly.  I congratulated her on her progress.

## 2022-04-23 ENCOUNTER — Ambulatory Visit (INDEPENDENT_AMBULATORY_CARE_PROVIDER_SITE_OTHER): Payer: BC Managed Care – PPO | Admitting: Family Medicine

## 2022-04-23 ENCOUNTER — Encounter (INDEPENDENT_AMBULATORY_CARE_PROVIDER_SITE_OTHER): Payer: Self-pay | Admitting: Family Medicine

## 2022-04-23 VITALS — BP 123/81 | HR 81 | Temp 98.0°F | Ht 66.0 in | Wt 245.0 lb

## 2022-04-23 DIAGNOSIS — E559 Vitamin D deficiency, unspecified: Secondary | ICD-10-CM

## 2022-04-23 DIAGNOSIS — E1169 Type 2 diabetes mellitus with other specified complication: Secondary | ICD-10-CM | POA: Diagnosis not present

## 2022-04-23 DIAGNOSIS — Z7985 Long-term (current) use of injectable non-insulin antidiabetic drugs: Secondary | ICD-10-CM

## 2022-04-23 DIAGNOSIS — I152 Hypertension secondary to endocrine disorders: Secondary | ICD-10-CM

## 2022-04-23 DIAGNOSIS — Z6839 Body mass index (BMI) 39.0-39.9, adult: Secondary | ICD-10-CM

## 2022-04-23 DIAGNOSIS — E1159 Type 2 diabetes mellitus with other circulatory complications: Secondary | ICD-10-CM | POA: Diagnosis not present

## 2022-04-23 DIAGNOSIS — E669 Obesity, unspecified: Secondary | ICD-10-CM

## 2022-04-23 MED ORDER — SEMAGLUTIDE (1 MG/DOSE) 4 MG/3ML ~~LOC~~ SOPN: 1.0000 mg | PEN_INJECTOR | SUBCUTANEOUS | 0 refills | Status: DC

## 2022-05-02 NOTE — Progress Notes (Unsigned)
Chief Complaint:   OBESITY Terri [name] is here to discuss her progress with her obesity treatment plan along with follow-up of her obesity related diagnoses. Terri [name] is on keeping a food journal and adhering to recommended goals of 400-500 calories and 35 grams of  protein for dinner and following Category 2 Plan and states she is following her eating plan approximately 90% of the time. Terri [name] states she is walking or doing pool exercises for 30 minutes each 2-3 times per week.  Today's visit was #: 23 Starting weight: 255 lbs Starting date: 08/22/2021 Today's weight: 241 lbs Today's date: 04/23/2022 Total lbs lost to date: 14 lbs Total lbs lost since last in-office visit: 0 lbs  Interim History: Khamora states "I'm doing well". It is challenging to eat lunch on the plan. She does bring her lunch but is not hungry to eat it. She is skipping foods and later making unhealthier choices. Terri [name] reports and increase in stress with her uncle being in the hospital.  Subjective:   1. Type 2 diabetes mellitus with other specified complication, without long-term current use of insulin ([place]) Janalyn's PCP took her off of insulin 4 units about two weeks ago and is only on Ozempic. She is skipping meals and eating poorly. Discussed with Odetta her A1c level of 6.2 one month ago and her fasting blood sugar of 100-110.  2. Vitamin D deficiency Dicussed with Pamala Hurry her Vitamin D level of 48.3 one month ago.  3. Hypertension associated with type 2 diabetes mellitus ([place]) Kaedynce is currently taking Norvasc and Diovan.  Assessment/Plan:  No orders of the defined types were placed in this encounter.   Medications Discontinued During This Encounter  Medication Reason   Semaglutide, 1 MG/DOSE, 4 MG/3ML SOPN Reorder     Meds ordered this encounter  Medications   Semaglutide, 1 MG/DOSE, 4 MG/3ML SOPN    Sig: Inject 1 mg into the skin once a week.    Dispense:  3 mL    Refill:  0     1. Type  2 diabetes mellitus with other specified complication, without long-term current use of insulin ([place]) Terri [name] will continue taking Metformin 500 mg twice a day and Ozempic; will refill at follows: - Semaglutide, 1 MG/DOSE, 4 MG/3ML SOPN; Inject 1 mg into the skin once a week.  Dispense: 3 mL; Refill: 0  2. Vitamin D deficiencyLow Vitamin D level contributes to fatigue and are associated with obesity, breast, and colon cancer. She agrees to continue to take prescription Vitamin D @50 ,000 IU every week and will follow-up for routine testing of Vitamin D, at least 2-3 times per year to avoid over-replacement.  3. Hypertension associated with type 2 diabetes mellitus ([place]) Terri [name]'s blood pressure is at goal.  4. Obesity, current BMI 39 Terri [name] is currently in the action stage of change. As such, her goal is to continue with weight loss efforts. She has agreed to the Category 2 plan and  keeping a food journal and adhering to recommended goals of 400-500 calories and 35 grams of protein at dinner.   Eating out guide was given to [facility].  Exercise goals: As is.  Behavioral modification strategies: increasing lean protein intake, decreasing simple carbohydrates, and planning for success.  Terri [name] has agreed to follow-up with our clinic in 2-4 weeks. She was informed of the importance of frequent follow-up visits to maximize her success with intensive lifestyle modifications for her multiple health conditions.   Objective:   Blood pressure 123/81,  pulse 81, temperature 98 F (36.7 C), height 5\' 6"  (1.676 m), weight 245 lb (111.1 kg), SpO2 99 %. Body mass index is 39.54 kg/m.  General: Cooperative, alert, well developed, in no acute distress. HEENT: Conjunctivae and lids unremarkable. Cardiovascular: Regular rhythm.  Lungs: Normal work of breathing. Neurologic: No focal deficits.   Lab Results  Component Value Date   CREATININE 0.73 03/18/2022   BUN 16 03/18/2022   NA 140 03/18/2022    K 4.6 03/18/2022   CL 100 03/18/2022   CO2 25 03/18/2022   Lab Results  Component Value Date   ALT 11 03/18/2022   AST 12 03/18/2022   ALKPHOS 47 03/18/2022   BILITOT 0.3 03/18/2022   Lab Results  Component Value Date   HGBA1C 6.2 (H) 03/18/2022   HGBA1C 6.4 (H) 11/12/2021   HGBA1C 7.3 (H) 06/27/2021   HGBA1C 7.4 (H) 02/28/2021   No results found for: "INSULIN" Lab Results  Component Value Date   TSH 1.650 03/18/2022   Lab Results  Component Value Date   CHOL 129 03/18/2022   HDL 58 03/18/2022   LDLCALC 53 03/18/2022   TRIG 98 03/18/2022   CHOLHDL 2.2 03/18/2022   Lab Results  Component Value Date   VD25OH 48.3 03/18/2022   VD25OH 42.6 08/22/2021   Lab Results  Component Value Date   WBC 4.8 03/18/2022   HGB 11.7 03/18/2022   HCT 35.0 03/18/2022   MCV 88 03/18/2022   PLT 209 03/18/2022   No results found for: "IRON", "TIBC", "FERRITIN"  Attestation Statements:   Reviewed by clinician on day of visit: allergies, medications, problem list, medical history, surgical history, family history, social history, and previous encounter notes.  I10/03/2022, CMA, am acting as transcriptionist for Dr. Paulla Fore, DO.   I have reviewed the above documentation for accuracy and completeness, and I agree with the above. Sharee Holster, D.O.  The 21st Century Cures Act was signed into law in 2016 which includes the topic of electronic health records.  This provides immediate access to information in MyChart.  This includes consultation notes, operative notes, office notes, lab results and pathology reports.  If you have any questions about what you read please let 2017 know at your next visit so we can discuss your concerns and take corrective action if need be.  We are right here with you.

## 2022-05-05 ENCOUNTER — Telehealth (INDEPENDENT_AMBULATORY_CARE_PROVIDER_SITE_OTHER): Payer: Self-pay | Admitting: Family Medicine

## 2022-05-05 NOTE — Telephone Encounter (Signed)
patient out ozempic [facility] out of stock.would like for you to check [facility].not sure how long she can be without.patient [phone]

## 2022-05-05 NOTE — Telephone Encounter (Signed)
CORRECT PHONE # [phone]

## 2022-05-06 ENCOUNTER — Encounter (INDEPENDENT_AMBULATORY_CARE_PROVIDER_SITE_OTHER): Payer: Self-pay | Admitting: Family Medicine

## 2022-05-06 DIAGNOSIS — Z90710 Acquired absence of both cervix and uterus: Secondary | ICD-10-CM | POA: Insufficient documentation

## 2022-05-06 NOTE — Telephone Encounter (Signed)
Let patient know to call around and if found, let us know, and we can send and or, may discuss alternatives at next office visit.

## 2022-05-06 NOTE — Progress Notes (Unsigned)
TeleHealth Visit:  This visit was completed with telemedicine (audio/video) technology. Terri [name] has verbally consented to this TeleHealth visit. The patient is located at home, the provider is located at home. The participants in this visit include the listed provider and patient. The visit was conducted today via MyChart video.  OBESITY Terri [name] is here to discuss her progress with her obesity treatment plan along with follow-up of her obesity related diagnoses.   Today's visit was # 14 Starting weight: 255 lbs Starting date: 08/22/2021 Weight at last in office visit: 241 lbs on 04/23/22 Total weight loss: 14 lbs at last in office visit on 04/23/22. Today's reported weight: 235 lbs   Nutrition Plan: the Category 2 Plan and keeping a food journal and adhering to recommended goals of 400-500 calories and 35 gms protein at supper.   Current exercise: walking for 30 minutes each 3 times per week.  Interim History: Terri [name] has been logging her food on paper since her last visit.  She said this has made her very aware of her food intake.  She sent the log through MyChart for me to review.  She is adhering to plan very well. She has a goal to be less than 200 pounds by Christmas.  Assessment/Plan:  1. Type II Diabetes HgbA1c is at goal. Last A1c was 0.2 on 03/18/2022.  She was taken off of [place] by her PCP. CBGs: Fasting 90-113 Episodes of hypoglycemia: no Medication(s): Metformin 500 mg twice daily with meals, pick 1 mg weekly.  She is overdue for an injection by almost a week due to availability.  He has found a pharmacy that has the medication.  Lab Results  Component Value Date   HGBA1C 6.2 (H) 03/18/2022   HGBA1C 6.4 (H) 11/12/2021   HGBA1C 7.3 (H) 06/27/2021   Lab Results  Component Value Date   LDLCALC 53 03/18/2022   CREATININE 0.73 03/18/2022    Plan: Refill Ozempic 1 mg weekly-sent to requested pharmacy.  2. Hyperlipidemia associated with type 2 diabetes LDL is  at goal (<70).  Goal was 53 on 03/18/2022, HDL was 58, triglycerides 98. Medication(s): Crestor 10 mg daily. Cardiovascular risk factors: diabetes mellitus, dyslipidemia, hypertension, and obesity (BMI >= 30 kg/m2)  Lab Results  Component Value Date   CHOL 129 03/18/2022   HDL 58 03/18/2022   LDLCALC 53 03/18/2022   TRIG 98 03/18/2022   CHOLHDL 2.2 03/18/2022   Lab Results  Component Value Date   ALT 11 03/18/2022   AST 12 03/18/2022   ALKPHOS 47 03/18/2022   BILITOT 0.3 03/18/2022   The ASCVD Risk score (Arnett DK, et al., 2019) failed to calculate for the following reasons:   The valid total cholesterol range is 130 to 320 mg/dL  Plan: Continue Crestor 10 mg daily.  3. Obesity: Current BMI 39.6 Terri [name] is currently in the action stage of change. As such, her goal is to continue with weight loss efforts.  She has agreed to the Category 2 Plan and keeping a food journal and adhering to recommended goals of 1100-1200 calories and 80 gms protein daily.  Journal using the Lose It! app.  Exercise goals: as is  Behavioral modification strategies: increasing lean protein intake, decreasing simple carbohydrates, planning for success, and keeping a strict food journal.  Terri [name] has agreed to follow-up with our clinic in 2 weeks.   No orders of the defined types were placed in this encounter.   Medications Discontinued During This Encounter  Medication Reason   Semaglutide,  1 MG/DOSE, 4 MG/3ML SOPN Reorder     Meds ordered this encounter  Medications   Semaglutide, 1 MG/DOSE, 4 MG/3ML SOPN    Sig: Inject 1 mg into the skin once a week.    Dispense:  3 mL    Refill:  0    Order Specific Question:   Supervising Provider    Answer:   Dell Ponto [[id number]]      Objective:   VITALS: Per patient if applicable, see vitals. GENERAL: Alert and in no acute distress. CARDIOPULMONARY: No increased WOB. Speaking in clear sentences.  PSYCH: Pleasant and cooperative. Speech normal  rate and rhythm. Affect is appropriate. Insight and judgement are appropriate. Attention is focused, linear, and appropriate.  NEURO: Oriented as arrived to appointment on time with no prompting.   Lab Results  Component Value Date   CREATININE 0.73 03/18/2022   BUN 16 03/18/2022   NA 140 03/18/2022   K 4.6 03/18/2022   CL 100 03/18/2022   CO2 25 03/18/2022   Lab Results  Component Value Date   ALT 11 03/18/2022   AST 12 03/18/2022   ALKPHOS 47 03/18/2022   BILITOT 0.3 03/18/2022   Lab Results  Component Value Date   HGBA1C 6.2 (H) 03/18/2022   HGBA1C 6.4 (H) 11/12/2021   HGBA1C 7.3 (H) 06/27/2021   HGBA1C 7.4 (H) 02/28/2021   No results found for: "INSULIN" Lab Results  Component Value Date   TSH 1.650 03/18/2022   Lab Results  Component Value Date   CHOL 129 03/18/2022   HDL 58 03/18/2022   LDLCALC 53 03/18/2022   TRIG 98 03/18/2022   CHOLHDL 2.2 03/18/2022   Lab Results  Component Value Date   WBC 4.8 03/18/2022   HGB 11.7 03/18/2022   HCT 35.0 03/18/2022   MCV 88 03/18/2022   PLT 209 03/18/2022   No results found for: "IRON", "TIBC", "FERRITIN" Lab Results  Component Value Date   VD25OH 48.3 03/18/2022   VD25OH 42.6 08/22/2021    Attestation Statements:   Reviewed by clinician on day of visit: allergies, medications, problem list, medical history, surgical history, family history, social history, and previous encounter notes.

## 2022-05-07 ENCOUNTER — Telehealth (INDEPENDENT_AMBULATORY_CARE_PROVIDER_SITE_OTHER): Payer: BC Managed Care – PPO | Admitting: Family Medicine

## 2022-05-07 ENCOUNTER — Encounter (INDEPENDENT_AMBULATORY_CARE_PROVIDER_SITE_OTHER): Payer: Self-pay | Admitting: Family Medicine

## 2022-05-07 DIAGNOSIS — E1169 Type 2 diabetes mellitus with other specified complication: Secondary | ICD-10-CM

## 2022-05-07 DIAGNOSIS — Z6839 Body mass index (BMI) 39.0-39.9, adult: Secondary | ICD-10-CM | POA: Diagnosis not present

## 2022-05-07 DIAGNOSIS — E785 Hyperlipidemia, unspecified: Secondary | ICD-10-CM

## 2022-05-07 DIAGNOSIS — Z7984 Long term (current) use of oral hypoglycemic drugs: Secondary | ICD-10-CM

## 2022-05-07 DIAGNOSIS — E669 Obesity, unspecified: Secondary | ICD-10-CM

## 2022-05-07 MED ORDER — SEMAGLUTIDE (1 MG/DOSE) 4 MG/3ML ~~LOC~~ SOPN: 1.0000 mg | PEN_INJECTOR | SUBCUTANEOUS | 0 refills | Status: DC

## 2022-05-16 ENCOUNTER — Encounter: Payer: Self-pay | Admitting: Internal Medicine

## 2022-05-16 ENCOUNTER — Ambulatory Visit (INDEPENDENT_AMBULATORY_CARE_PROVIDER_SITE_OTHER): Payer: BC Managed Care – PPO | Admitting: Internal Medicine

## 2022-05-16 VITALS — BP 126/79 | HR 95 | Ht 66.0 in | Wt 240.6 lb

## 2022-05-16 DIAGNOSIS — E1159 Type 2 diabetes mellitus with other circulatory complications: Secondary | ICD-10-CM

## 2022-05-16 DIAGNOSIS — Z794 Long term (current) use of insulin: Secondary | ICD-10-CM

## 2022-05-16 NOTE — Patient Instructions (Signed)
It was a pleasure to see you today.  Thank you for giving [place] the opportunity to be involved in your care.  Below is a brief recap of your visit and next steps.  We will plan to see you again in 3 months.  Summary Things are looking great! No medication changes today. You do not need to resume [place] injections.  Next steps Follow up in 3 months

## 2022-05-16 NOTE — Progress Notes (Signed)
Established Patient Office Visit  Subjective   Patient ID: Terri [name], female    DOB: 26-Jun-1966  Age: 56 y.o. MRN: [id number]  Chief Complaint  Patient presents with   Follow-up   Terri [name] returns to care today.  She is a 56 year old woman with a past medical history significant for HTN, T2DM, osteoarthritis, obesity, and vitamin D deficiency.  She was last seen by me on 9/8 for her annual physical exam.  We reviewed her lab work at that time.  Terri [name] requested to stop taking [place] Aas because she was only injecting 4 units nightly and her blood sugars have been well controlled.  Through shared decision making, we decided that Terri [name] would stop taking [place] Aas and would follow-up in 4 weeks for reassessment.  Today Terri [name] states that she is feeling well.  She has not taken used [place] over the last 4 weeks and has been checking her blood sugar each morning.  Her blood sugar readings are consistently 90-100.  She is otherwise asymptomatic and has no additional concerns today.  Past Medical History:  Diagnosis Date   Arthritis    right hip and knee pain; had right ankle fusion   Back pain    Diabetes mellitus without complication (HCC)    Edema, lower extremity    Hip pain    Hyperlipidemia    Hypertension    Joint pain    Knee pain    Past Surgical History:  Procedure Laterality Date   ANKLE FUSION Right    APPENDECTOMY     CHOLECYSTECTOMY N/A 03/21/2016   Procedure: LAPAROSCOPIC CHOLECYSTECTOMY;  Surgeon: Aviva Signs, MD;  Location: [facility];  Service: General;  Laterality: N/A;   TOTAL ABDOMINAL HYSTERECTOMY     Social History   Tobacco Use   Smoking status: Never   Smokeless tobacco: Never  Vaping Use   Vaping Use: Never used  Substance Use Topics   Alcohol use: No   Drug use: No   Family History  Problem Relation Age of Onset   Diabetes Mother    High blood pressure Mother    Stroke Mother    Obesity Mother    Sudden death  Father    Allergies  Allergen Reactions   Penicillins     Develops yeast infections when on medication   Review of Systems  Constitutional:  Negative for chills and fever.  HENT:  Negative for sore throat.   Respiratory:  Negative for cough and shortness of breath.   Cardiovascular:  Negative for chest pain, palpitations and leg swelling.  Gastrointestinal:  Negative for abdominal pain, blood in stool, constipation, diarrhea, nausea and vomiting.  Genitourinary:  Negative for dysuria and hematuria.  Musculoskeletal:  Negative for myalgias.  Skin:  Negative for itching and rash.  Neurological:  Negative for dizziness and headaches.  Psychiatric/Behavioral:  Negative for depression and suicidal ideas.      Objective:     BP 126/79   Pulse 95   Ht _0  (1.676 m)   Wt 240 lb 9.6 oz (109.1 kg)   SpO2 98%   BMI 38.83 kg/m  BP Readings from Last 3 Encounters:  05/16/22 126/79  04/23/22 123/81  04/18/22 118/70   Physical Exam Vitals reviewed.  Constitutional:      General: She is not in acute distress.    Appearance: Normal appearance. She is obese. She is not toxic-appearing.  HENT:     Head: Normocephalic and atraumatic.  Right Ear: External ear normal.     Left Ear: External ear normal.     Nose: Nose normal. No congestion or rhinorrhea.     Mouth/Throat:     Mouth: Mucous membranes are moist.     Pharynx: Oropharynx is clear. No oropharyngeal exudate or posterior oropharyngeal erythema.  Eyes:     General: No scleral icterus.    Conjunctiva/sclera: Conjunctivae normal.     Pupils: Pupils are equal, round, and reactive to light.  Cardiovascular:     Rate and Rhythm: Normal rate and regular rhythm.  Pulmonary:     Effort: Pulmonary effort is normal.     Breath sounds: Normal breath sounds. No wheezing, rhonchi or rales.  Abdominal:     General: Abdomen is flat. Bowel sounds are normal. There is no distension.     Palpations: Abdomen is soft.     Tenderness:  There is no abdominal tenderness.  Musculoskeletal:        General: Normal range of motion.     Cervical back: Normal range of motion.  Lymphadenopathy:     Cervical: No cervical adenopathy.  Skin:    General: Skin is warm and dry.     Capillary Refill: Capillary refill takes less than 2 seconds.     Coloration: Skin is not jaundiced.  Neurological:     General: No focal deficit present.     Mental Status: She is alert and oriented to person, place, and time.  Psychiatric:        Mood and Affect: Mood normal.        Behavior: Behavior normal.        Thought Content: Thought content normal.    Last CBC Lab Results  Component Value Date   WBC 4.8 03/18/2022   HGB 11.7 03/18/2022   HCT 35.0 03/18/2022   MCV 88 03/18/2022   MCH 29.4 03/18/2022   RDW 12.4 03/18/2022   PLT 209 06/07/2535   Last metabolic panel Lab Results  Component Value Date   GLUCOSE 99 03/18/2022   NA 140 03/18/2022   K 4.6 03/18/2022   CL 100 03/18/2022   CO2 25 03/18/2022   BUN 16 03/18/2022   CREATININE 0.73 03/18/2022   EGFR 97 03/18/2022   CALCIUM 9.4 03/18/2022   PHOS 2.3 (L) 03/22/2016   PROT 7.1 03/18/2022   ALBUMIN 4.6 03/18/2022   LABGLOB 2.5 03/18/2022   AGRATIO 1.8 03/18/2022   BILITOT 0.3 03/18/2022   ALKPHOS 47 03/18/2022   AST 12 03/18/2022   ALT 11 03/18/2022   ANIONGAP 11 11/28/2017   Last lipids Lab Results  Component Value Date   CHOL 129 03/18/2022   HDL 58 03/18/2022   LDLCALC 53 03/18/2022   TRIG 98 03/18/2022   CHOLHDL 2.2 03/18/2022   Last hemoglobin A1c Lab Results  Component Value Date   HGBA1C 6.2 (H) 03/18/2022   Last thyroid functions Lab Results  Component Value Date   TSH 1.650 03/18/2022   Last vitamin D Lab Results  Component Value Date   VD25OH 48.3 03/18/2022   Last vitamin B12 and Folate Lab Results  Component Value Date   VITAMINB12 378 08/22/2021    Assessment & Plan:   Problem List Items Addressed This Visit       Diabetes  mellitus ([place]) - Primary    HbA1c 6.2 in August.  She returns today for 4-week follow-up after stopping [place].  Blood sugars remain well controlled.  Fasting AM. blood sugars are consistently  90-100.  She has been taking metformin and Ozempic as prescribed. -Recent blood sugars reflect adequate control without [place].  Accordingly, we will discontinue [place] today.  I have congratulated [name] on her progress. -Urine microalbumin/creatinine ratio ordered today      Relevant Orders   Urine Microalbumin w/creat. ratio (Completed)    Return in about 3 months (around 08/16/2022).    Johnette Abraham, MD

## 2022-05-18 LAB — MICROALBUMIN / CREATININE URINE RATIO
Creatinine, Urine: 59.1 mg/dL
Microalb/Creat Ratio: 42 mg/g creat — ABNORMAL HIGH (ref 0–29)
Microalbumin, Urine: 24.6 ug/mL

## 2022-05-21 ENCOUNTER — Encounter (INDEPENDENT_AMBULATORY_CARE_PROVIDER_SITE_OTHER): Payer: Self-pay | Admitting: Family Medicine

## 2022-05-21 ENCOUNTER — Ambulatory Visit (INDEPENDENT_AMBULATORY_CARE_PROVIDER_SITE_OTHER): Payer: BC Managed Care – PPO | Admitting: Family Medicine

## 2022-05-21 VITALS — BP 132/84 | HR 84 | Temp 98.0°F | Ht 66.0 in | Wt 236.0 lb

## 2022-05-21 DIAGNOSIS — E1169 Type 2 diabetes mellitus with other specified complication: Secondary | ICD-10-CM

## 2022-05-21 DIAGNOSIS — E669 Obesity, unspecified: Secondary | ICD-10-CM

## 2022-05-21 DIAGNOSIS — Z6838 Body mass index (BMI) 38.0-38.9, adult: Secondary | ICD-10-CM

## 2022-05-21 DIAGNOSIS — R809 Proteinuria, unspecified: Secondary | ICD-10-CM

## 2022-05-21 DIAGNOSIS — Z7985 Long-term (current) use of injectable non-insulin antidiabetic drugs: Secondary | ICD-10-CM

## 2022-05-21 NOTE — Assessment & Plan Note (Signed)
HbA1c 6.2 in August.  She returns today for 4-week follow-up after stopping [place].  Blood sugars remain well controlled.  Fasting AM. blood sugars are consistently 90-100.  She has been taking metformin and Ozempic as prescribed. -Recent blood sugars reflect adequate control without [place].  Accordingly, we will discontinue [place] today.  I have congratulated Terri [name] on her progress. -Urine microalbumin/creatinine ratio ordered today

## 2022-05-30 ENCOUNTER — Other Ambulatory Visit: Payer: Self-pay | Admitting: Nurse Practitioner

## 2022-05-30 ENCOUNTER — Other Ambulatory Visit: Payer: Self-pay | Admitting: Family Medicine

## 2022-05-30 DIAGNOSIS — Z794 Long term (current) use of insulin: Secondary | ICD-10-CM

## 2022-05-30 DIAGNOSIS — E785 Hyperlipidemia, unspecified: Secondary | ICD-10-CM

## 2022-05-31 ENCOUNTER — Other Ambulatory Visit: Payer: Self-pay | Admitting: Family Medicine

## 2022-05-31 DIAGNOSIS — E1165 Type 2 diabetes mellitus with hyperglycemia: Secondary | ICD-10-CM

## 2022-05-31 NOTE — Progress Notes (Unsigned)
Chief Complaint:   OBESITY Terri [name] is here to discuss her progress with her obesity treatment plan along with follow-up of her obesity related diagnoses. Terri [name] is on the Category 2 Plan and keeping a food journal and adhering to recommended goals of 400-450 calories and 35 grams protein with dinner and states she is following her eating plan approximately 80% of the time. Terri [name] states she is walking 30 minutes 3 times per week.  Today's visit was #: 15 Starting weight: 255 lbs Starting date: 08/22/2021 Today's weight: 236 lbs Today's date: 05/21/2022 Total lbs lost to date: 19 Total lbs lost since last in-office visit: 5  Interim History: Terri [name] journaled/wrote down everything she ate and did an excellent job. However, she didn't follow calories or protein amounts.  Subjective:   1. Type 2 diabetes mellitus with other specified complication, without long-term current use of insulin (HCC) Terri [name] has not been on Ozempic for 2-3 weeks now. She denies hunger or cravings. Her blood sugars run 90-100's and has had no lows.  2. Microalbuminuria Discussed labs with patient today. Recent urine micro albumin showed >30. Protein in urine.  Assessment/Plan:  No orders of the defined types were placed in this encounter.   There are no discontinued medications.   No orders of the defined types were placed in this encounter.    1. Type 2 diabetes mellitus with other specified complication, without long-term current use of insulin (HCC) Good blood sugar control is important to decrease the likelihood of diabetic complications such as nephropathy, neuropathy, limb loss, blindness, coronary artery disease, and death. Intensive lifestyle modification including diet, exercise and weight loss are the first line of treatment for diabetes.  Continue prudent nutritional plan and look into filling Ozempic at various pharmacies. Continue to journal.  2. Microalbuminuria Discussed with pt  that she needs great control of blood sugar and BP. Continue valsartan/HCTZ, which is reno-protective. Counseling done on proteinuria due to diabetes mellitus and hypertension.  3. Obesity, current BMI 38.1 Terri [name] is currently in the action stage of change. As such, her goal is to continue with weight loss efforts. She has agreed to keeping a food journal and adhering to recommended goals of 1000-1100 calories and 80+ grams protein.   Pt will use [employer] to track. I showed pt exactly how to input and use app.  Exercise goals:  As is  Behavioral modification strategies: increasing lean protein intake and keeping a strict food journal.  Terri [name] has agreed to follow-up with our clinic in 2-3 weeks. She was informed of the importance of frequent follow-up visits to maximize her success with intensive lifestyle modifications for her multiple health conditions.   Objective:   Blood pressure 132/84, pulse 84, temperature 98 F (36.7 C), height 5\' 6"  (1.676 m), weight 236 lb (107 kg), SpO2 98 %. Body mass index is 38.09 kg/m.  General: Cooperative, alert, well developed, in no acute distress. HEENT: Conjunctivae and lids unremarkable. Cardiovascular: Regular rhythm.  Lungs: Normal work of breathing. Neurologic: No focal deficits.   Lab Results  Component Value Date   CREATININE 0.73 03/18/2022   BUN 16 03/18/2022   NA 140 03/18/2022   K 4.6 03/18/2022   CL 100 03/18/2022   CO2 25 03/18/2022   Lab Results  Component Value Date   ALT 11 03/18/2022   AST 12 03/18/2022   ALKPHOS 47 03/18/2022   BILITOT 0.3 03/18/2022   Lab Results  Component Value Date   HGBA1C 6.2 (H)  03/18/2022   HGBA1C 6.4 (H) 11/12/2021   HGBA1C 7.3 (H) 06/27/2021   HGBA1C 7.4 (H) 02/28/2021   No results found for: "INSULIN" Lab Results  Component Value Date   TSH 1.650 03/18/2022   Lab Results  Component Value Date   CHOL 129 03/18/2022   HDL 58 03/18/2022   LDLCALC 53 03/18/2022   TRIG 98  03/18/2022   CHOLHDL 2.2 03/18/2022   Lab Results  Component Value Date   VD25OH 48.3 03/18/2022   VD25OH 42.6 08/22/2021   Lab Results  Component Value Date   WBC 4.8 03/18/2022   HGB 11.7 03/18/2022   HCT 35.0 03/18/2022   MCV 88 03/18/2022   PLT 209 03/18/2022    Attestation Statements:   Reviewed by clinician on day of visit: allergies, medications, problem list, medical history, surgical history, family history, social history, and previous encounter notes.  I, Kathlene November, BS, CMA, am acting as transcriptionist for [employer], DO.  I have reviewed the above documentation for accuracy and completeness, and I agree with the above. Marjory Sneddon, D.O.  The [place] was signed into law in 2016 which includes the topic of electronic health records.  This provides immediate access to information in MyChart.  This includes consultation notes, operative notes, office notes, lab results and pathology reports.  If you have any questions about what you read please let us know at your next visit so we can discuss your concerns and take corrective action if need be.  We are right here with you.

## 2022-06-07 ENCOUNTER — Other Ambulatory Visit (INDEPENDENT_AMBULATORY_CARE_PROVIDER_SITE_OTHER): Payer: Self-pay | Admitting: Family Medicine

## 2022-06-07 DIAGNOSIS — E1169 Type 2 diabetes mellitus with other specified complication: Secondary | ICD-10-CM

## 2022-06-10 ENCOUNTER — Other Ambulatory Visit (INDEPENDENT_AMBULATORY_CARE_PROVIDER_SITE_OTHER): Payer: Self-pay | Admitting: Family Medicine

## 2022-06-10 DIAGNOSIS — E1169 Type 2 diabetes mellitus with other specified complication: Secondary | ICD-10-CM

## 2022-06-12 ENCOUNTER — Telehealth: Payer: Self-pay | Admitting: Internal Medicine

## 2022-06-12 ENCOUNTER — Other Ambulatory Visit: Payer: Self-pay

## 2022-06-12 DIAGNOSIS — I10 Essential (primary) hypertension: Secondary | ICD-10-CM

## 2022-06-12 DIAGNOSIS — Z794 Long term (current) use of insulin: Secondary | ICD-10-CM

## 2022-06-12 MED ORDER — VALSARTAN-HYDROCHLOROTHIAZIDE 320-25 MG PO TABS: 1.0000 | ORAL_TABLET | Freq: Every day | ORAL | 3 refills | Status: DC

## 2022-06-12 MED ORDER — METFORMIN HCL 500 MG PO TABS: ORAL_TABLET | ORAL | 0 refills | Status: DC

## 2022-06-12 MED ORDER — AMLODIPINE BESYLATE 5 MG PO TABS: 5.0000 mg | ORAL_TABLET | Freq: Every day | ORAL | 3 refills | Status: DC

## 2022-06-12 NOTE — Telephone Encounter (Signed)
Refills sent

## 2022-06-12 NOTE — Telephone Encounter (Signed)
Pt called stating she is needing refills. States it was denied by phar. She was seen in 10/6 by Dr. Doren Custard. Can you please refill?  amLODipine (NORVASC) 5 MG tablet   metFORMIN (GLUCOPHAGE) 500 MG tablet   valsartan-hydrochlorothiazide (DIOVAN-HCT) 320-25 MG tablet    [facility]

## 2022-06-16 ENCOUNTER — Ambulatory Visit (INDEPENDENT_AMBULATORY_CARE_PROVIDER_SITE_OTHER): Payer: BC Managed Care – PPO | Admitting: Family Medicine

## 2022-06-23 ENCOUNTER — Telehealth (INDEPENDENT_AMBULATORY_CARE_PROVIDER_SITE_OTHER): Payer: BC Managed Care – PPO | Admitting: Family Medicine

## 2022-06-24 ENCOUNTER — Telehealth (INDEPENDENT_AMBULATORY_CARE_PROVIDER_SITE_OTHER): Payer: BC Managed Care – PPO | Admitting: Family Medicine

## 2022-06-26 ENCOUNTER — Ambulatory Visit (INDEPENDENT_AMBULATORY_CARE_PROVIDER_SITE_OTHER): Payer: BC Managed Care – PPO | Admitting: Internal Medicine

## 2022-06-26 VITALS — BP 136/84 | HR 84 | Temp 97.7°F | Ht 66.0 in | Wt 236.0 lb

## 2022-06-26 DIAGNOSIS — Z7985 Long-term (current) use of injectable non-insulin antidiabetic drugs: Secondary | ICD-10-CM

## 2022-06-26 DIAGNOSIS — E669 Obesity, unspecified: Secondary | ICD-10-CM | POA: Diagnosis not present

## 2022-06-26 DIAGNOSIS — Z6838 Body mass index (BMI) 38.0-38.9, adult: Secondary | ICD-10-CM

## 2022-06-26 DIAGNOSIS — E1169 Type 2 diabetes mellitus with other specified complication: Secondary | ICD-10-CM | POA: Diagnosis not present

## 2022-06-26 DIAGNOSIS — I10 Essential (primary) hypertension: Secondary | ICD-10-CM | POA: Diagnosis not present

## 2022-06-26 DIAGNOSIS — Z7984 Long term (current) use of oral hypoglycemic drugs: Secondary | ICD-10-CM

## 2022-06-30 ENCOUNTER — Telehealth (INDEPENDENT_AMBULATORY_CARE_PROVIDER_SITE_OTHER): Payer: BC Managed Care – PPO | Admitting: Family Medicine

## 2022-07-07 DIAGNOSIS — I10 Essential (primary) hypertension: Secondary | ICD-10-CM | POA: Insufficient documentation

## 2022-07-09 NOTE — Progress Notes (Unsigned)
TeleHealth Visit:  This visit was completed with telemedicine (audio/video) technology. Terri [name] has verbally consented to this TeleHealth visit. The patient is located at home, the provider is located at home. The participants in this visit include the listed provider and patient. The visit was conducted today via MyChart video.  OBESITY Terri [name] is here to discuss her progress with her obesity treatment plan along with follow-up of her obesity related diagnoses.   Today's visit was # 16 Starting weight: 255 lbs Starting date: 08/22/2021 Weight at last in office visit: 236 lbs on 06/26/22 Total weight loss: 19 lbs at last in office visit on 06/26/22. Today's reported weight: 237 lbs   Nutrition Plan:  cat 2 or journaling 1000-1100 calories/80 gms protein   Current exercise: none   Interim History: Terri [name] is making slow steady progress.  She is down 6 pounds since July.  She has been out of Ozempic for about 6 weeks due to availability.  Reports hunger fairly well controlled when she eats enough protein.  She is following category 2 rather than journaling.  When she does journal she prefers using paper and pencil rather than an app.  Notices her clothes are fitting better. Has issues with stress eating and cravings.  There is a lot of extra food at school this time of year.  She eats 3 meals per day and packs her lunch for work-she is a special ed teacher K-1 in [place]. Has not exercise recently due to knee pain. She ordered some Protein H2O in order to supplement protein intake. Assessment/Plan:  1. Type II Diabetes microalbuminuria without long-term use of insulin HgbA1c is at goal. Last A1c was 0.2 on 03/18/2022.  PCP doing labs in January. Explained microalbuminuria and its implications.  GFR was 97 on 03/18/2022. Medication(s): Metformin 500 mg twice daily, prescribed Ozempic 1 mg weekly but has not had it for about 6 weeks due to availability.   Lab Results  Component  Value Date   HGBA1C 6.2 (H) 03/18/2022   HGBA1C 6.4 (H) 11/12/2021   HGBA1C 7.3 (H) 06/27/2021   Lab Results  Component Value Date   LDLCALC 53 03/18/2022   CREATININE 0.73 03/18/2022    Plan: Continue metformin 500 mg twice daily with meals. Discontinue Ozempic. Start Mounjaro 2.5 mg weekly. Terri [name] denies personal or family history of thyroid cancer, history of pancreatitis, or current cholelithiasis. Terri [name] was informed of the most common side effects (nausea, constipation, diarrhea).  2. Eating disorder/emotional eating Terri [name] has had issues with stress/emotional eating.  He is trying to eat regular meals with plenty of protein to avoid indulging. Medication(s): none  Plan: Start Mounjaro 2.5 mg weekly.  Likely this will help with cravings.  3. Obesity: Current BMI 38.2 Terri [name] is currently in the action stage of change. As such, her goal is to continue with weight loss efforts.  She has agreed to the Category 2 Plan and/or journaling 1000-1100 calories/80 gms protein  Exercise goals: Encouraged her to resume swimming for exercise.  Behavioral modification strategies: increasing lean protein intake, decreasing simple carbohydrates, avoiding temptations, and planning for success.  Terri [name] has agreed to follow-up with our clinic in 3 weeks.   No orders of the defined types were placed in this encounter.   Medications Discontinued During This Encounter  Medication Reason   Semaglutide, 1 MG/DOSE, 4 MG/3ML SOPN Change in therapy     Meds ordered this encounter  Medications   tirzepatide (MOUNJARO) 2.5 MG/0.5ML Pen    Sig: Inject 2.5  mg into the skin once a week.    Dispense:  2 mL    Refill:  0    Order Specific Question:   Supervising Provider    Answer:   Terri [name] [[id number]]      Objective:   VITALS: Per patient if applicable, see vitals. GENERAL: Alert and in no acute distress. CARDIOPULMONARY: No increased WOB. Speaking in clear sentences.  PSYCH:  Pleasant and cooperative. Speech normal rate and rhythm. Affect is appropriate. Insight and judgement are appropriate. Attention is focused, linear, and appropriate.  NEURO: Oriented as arrived to appointment on time with no prompting.   Lab Results  Component Value Date   CREATININE 0.73 03/18/2022   BUN 16 03/18/2022   NA 140 03/18/2022   K 4.6 03/18/2022   CL 100 03/18/2022   CO2 25 03/18/2022   Lab Results  Component Value Date   ALT 11 03/18/2022   AST 12 03/18/2022   ALKPHOS 47 03/18/2022   BILITOT 0.3 03/18/2022   Lab Results  Component Value Date   HGBA1C 6.2 (H) 03/18/2022   HGBA1C 6.4 (H) 11/12/2021   HGBA1C 7.3 (H) 06/27/2021   HGBA1C 7.4 (H) 02/28/2021   No results found for: "INSULIN" Lab Results  Component Value Date   TSH 1.650 03/18/2022   Lab Results  Component Value Date   CHOL 129 03/18/2022   HDL 58 03/18/2022   LDLCALC 53 03/18/2022   TRIG 98 03/18/2022   CHOLHDL 2.2 03/18/2022   Lab Results  Component Value Date   WBC 4.8 03/18/2022   HGB 11.7 03/18/2022   HCT 35.0 03/18/2022   MCV 88 03/18/2022   PLT 209 03/18/2022   No results found for: "IRON", "TIBC", "FERRITIN" Lab Results  Component Value Date   VD25OH 48.3 03/18/2022   VD25OH 42.6 08/22/2021    Attestation Statements:   Reviewed by clinician on day of visit: allergies, medications, problem list, medical history, surgical history, family history, social history, and previous encounter notes.

## 2022-07-10 ENCOUNTER — Telehealth (INDEPENDENT_AMBULATORY_CARE_PROVIDER_SITE_OTHER): Payer: BC Managed Care – PPO | Admitting: Family Medicine

## 2022-07-10 ENCOUNTER — Other Ambulatory Visit (INDEPENDENT_AMBULATORY_CARE_PROVIDER_SITE_OTHER): Payer: Self-pay | Admitting: Family Medicine

## 2022-07-10 ENCOUNTER — Encounter (INDEPENDENT_AMBULATORY_CARE_PROVIDER_SITE_OTHER): Payer: Self-pay | Admitting: Family Medicine

## 2022-07-10 DIAGNOSIS — F509 Eating disorder, unspecified: Secondary | ICD-10-CM

## 2022-07-10 DIAGNOSIS — E1129 Type 2 diabetes mellitus with other diabetic kidney complication: Secondary | ICD-10-CM

## 2022-07-10 DIAGNOSIS — R809 Proteinuria, unspecified: Secondary | ICD-10-CM

## 2022-07-10 DIAGNOSIS — E669 Obesity, unspecified: Secondary | ICD-10-CM

## 2022-07-10 DIAGNOSIS — Z6838 Body mass index (BMI) 38.0-38.9, adult: Secondary | ICD-10-CM

## 2022-07-10 DIAGNOSIS — Z7985 Long-term (current) use of injectable non-insulin antidiabetic drugs: Secondary | ICD-10-CM

## 2022-07-10 DIAGNOSIS — Z7984 Long term (current) use of oral hypoglycemic drugs: Secondary | ICD-10-CM

## 2022-07-10 MED ORDER — TIRZEPATIDE 2.5 MG/0.5ML ~~LOC~~ SOAJ: 2.5000 mg | SUBCUTANEOUS | 0 refills | Status: DC

## 2022-07-13 NOTE — Progress Notes (Unsigned)
Chief Complaint:   OBESITY Terri [name] is here to discuss her progress with her obesity treatment plan along with follow-up of her obesity related diagnoses. Terri [name] is on keeping a food journal and adhering to recommended goals of 1000-1100 calories and 80 grams protein and states she is following her eating plan approximately 75% of the time. Terri [name] states she is not currently exercising.  Today's visit was #: 16 Starting weight: 255 lbs Starting date: 08/22/2021 Today's weight: 236 lbs Today's date: 06/26/2022 Total lbs lost to date: 19 Total lbs lost since last in-office visit: 0  Interim History: Terri [name] reports having a lapse over the last 2 weeks as a result of family members needing care giving. She has also stopped exercising. Pt is on 0.5 mg Ozempic and has not been able to obtain 1 mg dose.   Subjective:   1. Type 2 diabetes mellitus with other specified complication, without long-term current use of insulin (HCC) Discussed labs with patient today. A1c 6.2 and down from 7.3. Terri [name] is on Metformin and Ozempic without side effects.  2. Hypertension, essential Discussed labs with patient today. BP slightly above target of <130/80. Pt is on amlodipine and ARB/HCTZ. Recent renal parameters- normal GFR and electrolytes.  Assessment/Plan:   1. Type 2 diabetes mellitus with other specified complication, without long-term current use of insulin (HCC) Continue weight loss therapy and medicine.   2. Hypertension, essential Weight loss therapy. Continue medication regimen and home monitoring.  3. Obesity, current BMI 38.2 Terri [name] is currently in the action stage of change. As such, her goal is to continue with weight loss efforts. She has agreed to the Category 2 Plan. Resume category 2 meal plan. Pt given list of plant based protein shakes.  Exercise goals:  As is  Behavioral modification strategies: increasing lean protein intake, no skipping meals, meal planning and  cooking strategies, and planning for success.  Terri [name] has agreed to follow-up with our clinic in 4 weeks. She was informed of the importance of frequent follow-up visits to maximize her success with intensive lifestyle modifications for her multiple health conditions.   Objective:   Blood pressure 136/84, pulse 84, temperature 97.7 F (36.5 C), height 5\' 6"  (1.676 m), weight 236 lb (107 kg), SpO2 100 %. Body mass index is 38.09 kg/m.  General: Cooperative, alert, well developed, in no acute distress. HEENT: Conjunctivae and lids unremarkable. Cardiovascular: Regular rhythm.  Lungs: Normal work of breathing. Neurologic: No focal deficits.   Lab Results  Component Value Date   CREATININE 0.73 03/18/2022   BUN 16 03/18/2022   NA 140 03/18/2022   K 4.6 03/18/2022   CL 100 03/18/2022   CO2 25 03/18/2022   Lab Results  Component Value Date   ALT 11 03/18/2022   AST 12 03/18/2022   ALKPHOS 47 03/18/2022   BILITOT 0.3 03/18/2022   Lab Results  Component Value Date   HGBA1C 6.2 (H) 03/18/2022   HGBA1C 6.4 (H) 11/12/2021   HGBA1C 7.3 (H) 06/27/2021   HGBA1C 7.4 (H) 02/28/2021   No results found for: "INSULIN" Lab Results  Component Value Date   TSH 1.650 03/18/2022   Lab Results  Component Value Date   CHOL 129 03/18/2022   HDL 58 03/18/2022   LDLCALC 53 03/18/2022   TRIG 98 03/18/2022   CHOLHDL 2.2 03/18/2022   Lab Results  Component Value Date   VD25OH 48.3 03/18/2022   VD25OH 42.6 08/22/2021   Lab Results  Component Value Date  WBC 4.8 03/18/2022   HGB 11.7 03/18/2022   HCT 35.0 03/18/2022   MCV 88 03/18/2022   PLT 209 03/18/2022    Attestation Statements:   Reviewed by clinician on day of visit: allergies, medications, problem list, medical history, surgical history, family history, social history, and previous encounter notes.  Time spent on visit including pre-visit chart review and post-visit care and charting was 20 minutes.   I, Kyung Rudd, BS, CMA, am acting as transcriptionist for Worthy Rancher, MD.  I have reviewed the above documentation for accuracy and completeness, and I agree with the above. -  ***

## 2022-07-14 ENCOUNTER — Encounter (INDEPENDENT_AMBULATORY_CARE_PROVIDER_SITE_OTHER): Payer: Self-pay | Admitting: Family Medicine

## 2022-07-14 ENCOUNTER — Other Ambulatory Visit (INDEPENDENT_AMBULATORY_CARE_PROVIDER_SITE_OTHER): Payer: Self-pay | Admitting: Family Medicine

## 2022-07-14 DIAGNOSIS — E1129 Type 2 diabetes mellitus with other diabetic kidney complication: Secondary | ICD-10-CM

## 2022-07-16 ENCOUNTER — Telehealth: Payer: Self-pay

## 2022-07-16 MED ORDER — VICTOZA 18 MG/3ML ~~LOC~~ SOPN: PEN_INJECTOR | SUBCUTANEOUS | 0 refills | Status: DC

## 2022-07-16 MED ORDER — BD PEN NEEDLE NANO 2ND GEN 32G X 4 MM MISC: 0 refills | Status: DC

## 2022-07-16 NOTE — Telephone Encounter (Signed)
Prior Auth for [employer]:  Pt has been denied. Appeal has been started. She will be notified by MyChart and [employer].

## 2022-07-21 ENCOUNTER — Other Ambulatory Visit: Payer: Self-pay | Admitting: Internal Medicine

## 2022-07-21 DIAGNOSIS — Z794 Long term (current) use of insulin: Secondary | ICD-10-CM

## 2022-07-28 ENCOUNTER — Ambulatory Visit (INDEPENDENT_AMBULATORY_CARE_PROVIDER_SITE_OTHER): Payer: BC Managed Care – PPO | Admitting: Family Medicine

## 2022-07-28 ENCOUNTER — Encounter (INDEPENDENT_AMBULATORY_CARE_PROVIDER_SITE_OTHER): Payer: Self-pay | Admitting: Family Medicine

## 2022-07-28 VITALS — BP 134/87 | HR 88 | Temp 97.9°F | Ht 66.0 in | Wt 238.2 lb

## 2022-07-28 DIAGNOSIS — E1169 Type 2 diabetes mellitus with other specified complication: Secondary | ICD-10-CM

## 2022-07-28 DIAGNOSIS — Z6838 Body mass index (BMI) 38.0-38.9, adult: Secondary | ICD-10-CM | POA: Diagnosis not present

## 2022-07-28 DIAGNOSIS — Z7985 Long-term (current) use of injectable non-insulin antidiabetic drugs: Secondary | ICD-10-CM

## 2022-07-28 DIAGNOSIS — E669 Obesity, unspecified: Secondary | ICD-10-CM | POA: Diagnosis not present

## 2022-07-28 MED ORDER — VICTOZA 18 MG/3ML ~~LOC~~ SOPN: PEN_INJECTOR | SUBCUTANEOUS | 0 refills | Status: DC

## 2022-08-11 ENCOUNTER — Other Ambulatory Visit (INDEPENDENT_AMBULATORY_CARE_PROVIDER_SITE_OTHER): Payer: Self-pay | Admitting: Family Medicine

## 2022-08-11 DIAGNOSIS — E1169 Type 2 diabetes mellitus with other specified complication: Secondary | ICD-10-CM

## 2022-08-13 NOTE — Progress Notes (Signed)
Chief Complaint:   OBESITY Terri [name] is here to discuss her progress with her obesity treatment plan along with follow-up of her obesity related diagnoses. Terri [name] is on the Category 2 Plan and keeping a food journal and adhering to recommended goals of 1000-1100 calories and 80 protein and states she is following her eating plan approximately 80% of the time. Terri [name] states she is not exercising.   Today's visit was #: 64 Starting weight: 255 LBS Starting date: 08/22/2021 Today's weight: 238 LBS Today's date: 07/28/2022 Total lbs lost to date: 17 LBS Total lbs lost since last in-office visit: +1 LB  Interim History: Patient gained 0.6 and muscle mass and fat mass.  She is doing well and has journal the last 2 weeks.  Patient did great overall in the past several weeks with Thanksgiving.  Subjective:   1. Type 2 diabetes mellitus with obesity (HCC) Fasting blood sugar are ranging between 84-1 22.  Patient is stable and asymptomatic.  Started patient on Victoza.  Assessment/Plan:  No orders of the defined types were placed in this encounter.   Medications Discontinued During This Encounter  Medication Reason   liraglutide (VICTOZA) 18 MG/3ML SOPN Reorder     Meds ordered this encounter  Medications   liraglutide (VICTOZA) 18 MG/3ML SOPN    Sig: 1.2mg  once a day q AM    Dispense:  6 mL    Refill:  0     1. Type 2 diabetes mellitus with obesity ([place]) Continue PNT and weight loss.  Increase protein and do not skip meals.  Refill- liraglutide (VICTOZA) 18 MG/3ML SOPN; 1.2mg  once a day q AM  Dispense: 6 mL; Refill: 0  2. Obesity, current BMI 38.4 Patient getting labs on 08/18/2022 with PCP.  Declines labs were here today.  She will ask for a vitamin D refill at that appointment.  Patient prefers to write off food she eats down in a notebook.  Handout on Holiday eating strategies given to patient today.  Terri [name] is currently in the action stage of change. As such, her goal is  to continue with weight loss efforts. She has agreed to the Category 2 Plan and keeping a food journal and adhering to recommended goals of 1000 1100 40- calories and 80 protein.   Exercise goals:  As is.  Behavioral modification strategies: increasing lean protein intake, decreasing simple carbohydrates, holiday eating strategies , and avoiding temptations.  Terri [name] has agreed to follow-up with our clinic in 3 weeks. She was informed of the importance of frequent follow-up visits to maximize her success with intensive lifestyle modifications for her multiple health conditions.   Objective:   Blood pressure 134/87, pulse 88, temperature 97.9 F (36.6 C), height 5\' 6"  (1.676 m), weight 238 lb 3.2 oz (108 kg), SpO2 100 %. Body mass index is 38.45 kg/m.  General: Cooperative, alert, well developed, in no acute distress. HEENT: Conjunctivae and lids unremarkable. Cardiovascular: Regular rhythm.  Lungs: Normal work of breathing. Neurologic: No focal deficits.   Lab Results  Component Value Date   CREATININE 0.73 03/18/2022   BUN 16 03/18/2022   NA 140 03/18/2022   K 4.6 03/18/2022   CL 100 03/18/2022   CO2 25 03/18/2022   Lab Results  Component Value Date   ALT 11 03/18/2022   AST 12 03/18/2022   ALKPHOS 47 03/18/2022   BILITOT 0.3 03/18/2022   Lab Results  Component Value Date   HGBA1C 6.2 (H) 03/18/2022   HGBA1C 6.4 (  H) 11/12/2021   HGBA1C 7.3 (H) 06/27/2021   HGBA1C 7.4 (H) 02/28/2021   No results found for: "INSULIN" Lab Results  Component Value Date   TSH 1.650 03/18/2022   Lab Results  Component Value Date   CHOL 129 03/18/2022   HDL 58 03/18/2022   LDLCALC 53 03/18/2022   TRIG 98 03/18/2022   CHOLHDL 2.2 03/18/2022   Lab Results  Component Value Date   VD25OH 48.3 03/18/2022   VD25OH 42.6 08/22/2021   Lab Results  Component Value Date   WBC 4.8 03/18/2022   HGB 11.7 03/18/2022   HCT 35.0 03/18/2022   MCV 88 03/18/2022   PLT 209 03/18/2022   No  results found for: "IRON", "TIBC", "FERRITIN"  Attestation Statements:   Reviewed by clinician on day of visit: allergies, medications, problem list, medical history, surgical history, family history, social history, and previous encounter notes.  I, Davy Pique, RMA, am acting as Location manager for [employer], DO.   I have reviewed the above documentation for accuracy and completeness, and I agree with the above. Marjory Sneddon, D.O.  The [place] was signed into law in 2016 which includes the topic of electronic health records.  This provides immediate access to information in MyChart.  This includes consultation notes, operative notes, office notes, lab results and pathology reports.  If you have any questions about what you read please let us know at your next visit so we can discuss your concerns and take corrective action if need be.  We are right here with you.

## 2022-08-18 ENCOUNTER — Telehealth (INDEPENDENT_AMBULATORY_CARE_PROVIDER_SITE_OTHER): Payer: BC Managed Care – PPO | Admitting: Family Medicine

## 2022-08-18 ENCOUNTER — Encounter: Payer: Self-pay | Admitting: Internal Medicine

## 2022-08-18 ENCOUNTER — Ambulatory Visit (INDEPENDENT_AMBULATORY_CARE_PROVIDER_SITE_OTHER): Payer: BC Managed Care – PPO | Admitting: Internal Medicine

## 2022-08-18 VITALS — BP 130/80 | HR 90 | Ht 66.0 in | Wt 242.6 lb

## 2022-08-18 DIAGNOSIS — I152 Hypertension secondary to endocrine disorders: Secondary | ICD-10-CM | POA: Diagnosis not present

## 2022-08-18 DIAGNOSIS — E669 Obesity, unspecified: Secondary | ICD-10-CM | POA: Diagnosis not present

## 2022-08-18 DIAGNOSIS — E1159 Type 2 diabetes mellitus with other circulatory complications: Secondary | ICD-10-CM

## 2022-08-18 DIAGNOSIS — E1169 Type 2 diabetes mellitus with other specified complication: Secondary | ICD-10-CM | POA: Diagnosis not present

## 2022-08-18 LAB — POCT GLYCOSYLATED HEMOGLOBIN (HGB A1C): HbA1c, POC (controlled diabetic range): 6 % (ref 0.0–7.0)

## 2022-08-18 MED ORDER — METFORMIN HCL ER 500 MG PO TB24: 500.0000 mg | ORAL_TABLET | Freq: Two times a day (BID) | ORAL | 2 refills | Status: DC

## 2022-08-18 NOTE — Patient Instructions (Signed)
It was a pleasure to see you today.  Thank you for giving [place] the opportunity to be involved in your care.  Below is a brief recap of your visit and next steps.  We will plan to see you again in 4 weeks.  Summary Stop metformin today Start metformin-XR 500 mg twice daily Please keep a blood pressure log over the next 4 weeks We will follow up in 4 weeks for HTN and diabetes

## 2022-08-18 NOTE — Progress Notes (Unsigned)
Established Patient Office Visit  Subjective   Patient ID: Terri [name], female    DOB: 03/03/1966  Age: 57 y.o. MRN: [id number]  Chief Complaint  Patient presents with   Diabetes    Follow up   Terri [name] returns to care today for routine follow-up.  She was last seen by me on 05/16/22 at which time Terri [name] was discontinued as a result of adequate glycemic control.  There have been no acute interval events.  Terri [name] [facility].  She reports feeling well today and has no acute concerns to discuss.  Past Medical History:  Diagnosis Date   Arthritis    right hip and knee pain; had right ankle fusion   Back pain    Diabetes mellitus without complication (HCC)    Edema, lower extremity    Hip pain    Hyperlipidemia    Hypertension    Joint pain    Knee pain    Past Surgical History:  Procedure Laterality Date   ANKLE FUSION Right    APPENDECTOMY     CHOLECYSTECTOMY N/A 03/21/2016   Procedure: LAPAROSCOPIC CHOLECYSTECTOMY;  Surgeon: Terri Macho, MD;  Location: [facility];  Service: General;  Laterality: N/A;   TOTAL ABDOMINAL HYSTERECTOMY     Social History   Tobacco Use   Smoking status: Never   Smokeless tobacco: Never  Vaping Use   Vaping Use: Never used  Substance Use Topics   Alcohol use: No   Drug use: No   Family History  Problem Relation Age of Onset   Diabetes Mother    High blood pressure Mother    Stroke Mother    Obesity Mother    Sudden death Father    Allergies  Allergen Reactions   Penicillins     Develops yeast infections when on medication   Review of Systems  Constitutional:  Negative for chills and fever.  HENT:  Negative for sore throat.   Respiratory:  Negative for cough and shortness of breath.   Cardiovascular:  Negative for chest pain, palpitations and leg swelling.  Gastrointestinal:  Negative for abdominal pain, blood in stool, constipation, diarrhea, nausea and  vomiting.  Genitourinary:  Negative for dysuria and hematuria.  Musculoskeletal:  Negative for myalgias.  Skin:  Negative for itching and rash.  Neurological:  Negative for dizziness and headaches.  Psychiatric/Behavioral:  Negative for depression and suicidal ideas.      Objective:     BP 130/80   Pulse 90   Ht 5\' 6"  (1.676 m)   Wt 242 lb 9.6 oz (110 kg)   SpO2 93%   BMI 39.16 kg/m  BP Readings from Last 3 Encounters:  08/18/22 130/80  07/28/22 134/87  06/26/22 136/84   Physical Exam Vitals reviewed.  Constitutional:      General: She is not in acute distress.    Appearance: Normal appearance. She is obese. She is not toxic-appearing.  HENT:     Head: Normocephalic and atraumatic.     Right Ear: External ear normal.     Left Ear: External ear normal.     Nose: Nose normal. No congestion or rhinorrhea.     Mouth/Throat:     Mouth: Mucous membranes are moist.     Pharynx: Oropharynx is clear. No oropharyngeal exudate or posterior oropharyngeal erythema.  Eyes:     General: No scleral icterus.    Conjunctiva/sclera: Conjunctivae normal.     Pupils: Pupils  are equal, round, and reactive to light.  Cardiovascular:     Rate and Rhythm: Normal rate and regular rhythm.  Pulmonary:     Effort: Pulmonary effort is normal.     Breath sounds: Normal breath sounds. No wheezing, rhonchi or rales.  Abdominal:     General: Abdomen is flat. Bowel sounds are normal. There is no distension.     Palpations: Abdomen is soft.     Tenderness: There is no abdominal tenderness.  Musculoskeletal:        General: Normal range of motion.     Cervical back: Normal range of motion.  Lymphadenopathy:     Cervical: No cervical adenopathy.  Skin:    General: Skin is warm and dry.     Capillary Refill: Capillary refill takes less than 2 seconds.     Coloration: Skin is not jaundiced.  Neurological:     General: No focal deficit present.     Mental Status: She is alert and oriented to  person, place, and time.  Psychiatric:        Mood and Affect: Mood normal.        Behavior: Behavior normal.        Thought Content: Thought content normal.    Last CBC Lab Results  Component Value Date   WBC 4.8 03/18/2022   HGB 11.7 03/18/2022   HCT 35.0 03/18/2022   MCV 88 03/18/2022   MCH 29.4 03/18/2022   RDW 12.4 03/18/2022   PLT 209 16/08/930   Last metabolic panel Lab Results  Component Value Date   GLUCOSE 99 03/18/2022   NA 140 03/18/2022   K 4.6 03/18/2022   CL 100 03/18/2022   CO2 25 03/18/2022   BUN 16 03/18/2022   CREATININE 0.73 03/18/2022   EGFR 97 03/18/2022   CALCIUM 9.4 03/18/2022   PHOS 2.3 (L) 03/22/2016   PROT 7.1 03/18/2022   ALBUMIN 4.6 03/18/2022   LABGLOB 2.5 03/18/2022   AGRATIO 1.8 03/18/2022   BILITOT 0.3 03/18/2022   ALKPHOS 47 03/18/2022   AST 12 03/18/2022   ALT 11 03/18/2022   ANIONGAP 11 11/28/2017   Last lipids Lab Results  Component Value Date   CHOL 129 03/18/2022   HDL 58 03/18/2022   LDLCALC 53 03/18/2022   TRIG 98 03/18/2022   CHOLHDL 2.2 03/18/2022   Last hemoglobin A1c Lab Results  Component Value Date   HGBA1C 6.0 08/18/2022   Last thyroid functions Lab Results  Component Value Date   TSH 1.650 03/18/2022   Last vitamin D Lab Results  Component Value Date   VD25OH 48.3 03/18/2022   Last vitamin B12 and Folate Lab Results  Component Value Date   VITAMINB12 378 08/22/2021     Assessment & Plan:   Problem List Items Addressed This Visit       Hypertension associated with type 2 diabetes mellitus ([place])    She is currently prescribed amlodipine 5 mg daily and valsartan-HCTZ 320-25 mg daily for treatment of hypertension.  Her blood pressure today is 130/80.  On review of recent blood pressure assessments, her systolic pressures have consistently been at least 130 mmHg.  Her diastolic pressures have been mildly elevated as well. -No medication changes today -We will plan for follow-up in 4 weeks for  HTN check.  In the interim I have asked that she keep a blood pressure log at home.      Type 2 diabetes mellitus with obesity ([place]) - Primary  POC A1c today is 6.0.  This is improved from 6.2 previously.  She is currently prescribed Victoza 1.2 mg daily as well as metformin 1000 mg twice daily with meals.  She endorses chronic diarrhea that seems to correlate with increasing metformin. -Switch to metformin XR today.  We will start with 500 mg twice daily.  Pending tolerance, can increase to 1000 mg twice daily at follow-up in 1 month.      Return in about 4 weeks (around 09/15/2022) for HTN, DM.    [name], MD

## 2022-08-18 NOTE — Progress Notes (Unsigned)
TeleHealth Visit:  This visit was completed with telemedicine (audio/video) technology. Terri [name] has verbally consented to this TeleHealth visit. The patient is located at home, the provider is located at home. The participants in this visit include the listed provider and patient. The visit was conducted today via MyChart video.  OBESITY Terri [name] is here to discuss her progress with her obesity treatment plan along with follow-up of her obesity related diagnoses.   Today's visit was # 18 Starting weight: 255 lbs Starting date: 08/22/2021 Weight at last in office visit: 238 lbs on 07/28/22 Total weight loss: 17 lbs at last in office visit on 07/28/22. Today's reported weight: *** lbs No weight reported.  Nutrition Plan: Category 2 Plan and keeping a food journal and adhering to recommended goals of 1000-1100 calories and 80 protein daily.   Current exercise: {exercise types:16438} none  Interim History:  ***  Assessment/Plan:  1. ***  2. ***  3. ***  Obesity: Current BMI *** Terri [name] {CHL AMB IS/IS NOT:210130109} currently in the action stage of change. As such, her goal is to {MWMwtloss#1:210800005}.  She has agreed to {MWMwtlossportion/plan2:23431}.   Exercise goals: {MWM EXERCISE RECS:23473}  Behavioral modification strategies: {MWMwtlossdietstrategies3:23432}.  Terri [name] has agreed to follow-up with our clinic in {NUMBER 1-10:22536} weeks.   No orders of the defined types were placed in this encounter.   There are no discontinued medications.   No orders of the defined types were placed in this encounter.     Objective:   VITALS: Per patient if applicable, see vitals. GENERAL: Alert and in no acute distress. CARDIOPULMONARY: No increased WOB. Speaking in clear sentences.  PSYCH: Pleasant and cooperative. Speech normal rate and rhythm. Affect is appropriate. Insight and judgement are appropriate. Attention is focused, linear, and appropriate.  NEURO:  Oriented as arrived to appointment on time with no prompting.   Lab Results  Component Value Date   CREATININE 0.73 03/18/2022   BUN 16 03/18/2022   NA 140 03/18/2022   K 4.6 03/18/2022   CL 100 03/18/2022   CO2 25 03/18/2022   Lab Results  Component Value Date   ALT 11 03/18/2022   AST 12 03/18/2022   ALKPHOS 47 03/18/2022   BILITOT 0.3 03/18/2022   Lab Results  Component Value Date   HGBA1C 6.2 (H) 03/18/2022   HGBA1C 6.4 (H) 11/12/2021   HGBA1C 7.3 (H) 06/27/2021   HGBA1C 7.4 (H) 02/28/2021   No results found for: "INSULIN" Lab Results  Component Value Date   TSH 1.650 03/18/2022   Lab Results  Component Value Date   CHOL 129 03/18/2022   HDL 58 03/18/2022   LDLCALC 53 03/18/2022   TRIG 98 03/18/2022   CHOLHDL 2.2 03/18/2022   Lab Results  Component Value Date   WBC 4.8 03/18/2022   HGB 11.7 03/18/2022   HCT 35.0 03/18/2022   MCV 88 03/18/2022   PLT 209 03/18/2022   No results found for: "IRON", "TIBC", "FERRITIN" Lab Results  Component Value Date   VD25OH 48.3 03/18/2022   VD25OH 42.6 08/22/2021    Attestation Statements:   Reviewed by clinician on day of visit: allergies, medications, problem list, medical history, surgical history, family history, social history, and previous encounter notes.  ***(delete if time-based billing not used) Time spent on visit including the items listed below was *** minutes.  -preparing to see the patient (e.g., review of tests, history, previous notes) -obtaining and/or reviewing separately obtained history -counseling and educating the patient/family/caregiver -documenting clinical information in  the electronic or other health record -ordering medications, tests, or procedures -independently interpreting results and communicating results to the patient/ family/caregiver -referring and communicating with other health care professionals  -care coordination

## 2022-08-19 ENCOUNTER — Encounter (INDEPENDENT_AMBULATORY_CARE_PROVIDER_SITE_OTHER): Payer: Self-pay | Admitting: Family Medicine

## 2022-08-19 ENCOUNTER — Telehealth (INDEPENDENT_AMBULATORY_CARE_PROVIDER_SITE_OTHER): Payer: BC Managed Care – PPO | Admitting: Family Medicine

## 2022-08-19 DIAGNOSIS — I152 Hypertension secondary to endocrine disorders: Secondary | ICD-10-CM

## 2022-08-19 DIAGNOSIS — E1159 Type 2 diabetes mellitus with other circulatory complications: Secondary | ICD-10-CM | POA: Diagnosis not present

## 2022-08-19 DIAGNOSIS — E1169 Type 2 diabetes mellitus with other specified complication: Secondary | ICD-10-CM | POA: Diagnosis not present

## 2022-08-19 DIAGNOSIS — Z7985 Long-term (current) use of injectable non-insulin antidiabetic drugs: Secondary | ICD-10-CM

## 2022-08-19 DIAGNOSIS — E669 Obesity, unspecified: Secondary | ICD-10-CM | POA: Diagnosis not present

## 2022-08-19 DIAGNOSIS — Z6838 Body mass index (BMI) 38.0-38.9, adult: Secondary | ICD-10-CM

## 2022-08-20 NOTE — Assessment & Plan Note (Signed)
POC A1c today is 6.0.  This is improved from 6.2 previously.  She is currently prescribed Victoza 1.2 mg daily as well as metformin 1000 mg twice daily with meals.  She endorses chronic diarrhea that seems to correlate with increasing metformin. -Switch to metformin XR today.  We will start with 500 mg twice daily.  Pending tolerance, can increase to 1000 mg twice daily at follow-up in 1 month.

## 2022-08-20 NOTE — Assessment & Plan Note (Signed)
She is currently prescribed amlodipine 5 mg daily and valsartan-HCTZ 320-25 mg daily for treatment of hypertension.  Her blood pressure today is 130/80.  On review of recent blood pressure assessments, her systolic pressures have consistently been at least 130 mmHg.  Her diastolic pressures have been mildly elevated as well. -No medication changes today -We will plan for follow-up in 4 weeks for HTN check.  In the interim I have asked that she keep a blood pressure log at home.

## 2022-08-24 ENCOUNTER — Encounter: Payer: Self-pay | Admitting: Internal Medicine

## 2022-09-08 ENCOUNTER — Ambulatory Visit (INDEPENDENT_AMBULATORY_CARE_PROVIDER_SITE_OTHER): Payer: BC Managed Care – PPO | Admitting: Family Medicine

## 2022-09-08 ENCOUNTER — Encounter (INDEPENDENT_AMBULATORY_CARE_PROVIDER_SITE_OTHER): Payer: Self-pay | Admitting: Family Medicine

## 2022-09-08 VITALS — BP 133/80 | HR 94 | Temp 97.8°F | Ht 66.0 in | Wt 238.4 lb

## 2022-09-08 DIAGNOSIS — E669 Obesity, unspecified: Secondary | ICD-10-CM

## 2022-09-08 DIAGNOSIS — Z6838 Body mass index (BMI) 38.0-38.9, adult: Secondary | ICD-10-CM

## 2022-09-08 DIAGNOSIS — E1169 Type 2 diabetes mellitus with other specified complication: Secondary | ICD-10-CM | POA: Diagnosis not present

## 2022-09-08 DIAGNOSIS — I152 Hypertension secondary to endocrine disorders: Secondary | ICD-10-CM | POA: Diagnosis not present

## 2022-09-08 DIAGNOSIS — E1159 Type 2 diabetes mellitus with other circulatory complications: Secondary | ICD-10-CM | POA: Diagnosis not present

## 2022-09-08 DIAGNOSIS — Z7985 Long-term (current) use of injectable non-insulin antidiabetic drugs: Secondary | ICD-10-CM

## 2022-09-08 DIAGNOSIS — Z6839 Body mass index (BMI) 39.0-39.9, adult: Secondary | ICD-10-CM | POA: Insufficient documentation

## 2022-09-08 MED ORDER — VICTOZA 18 MG/3ML ~~LOC~~ SOPN: 1.8000 mg | PEN_INJECTOR | Freq: Every day | SUBCUTANEOUS | 0 refills | Status: DC

## 2022-09-15 ENCOUNTER — Encounter: Payer: Self-pay | Admitting: Internal Medicine

## 2022-09-15 ENCOUNTER — Ambulatory Visit (INDEPENDENT_AMBULATORY_CARE_PROVIDER_SITE_OTHER): Payer: BC Managed Care – PPO | Admitting: Internal Medicine

## 2022-09-15 VITALS — BP 132/70 | HR 105 | Ht 66.0 in | Wt 241.0 lb

## 2022-09-15 DIAGNOSIS — E1169 Type 2 diabetes mellitus with other specified complication: Secondary | ICD-10-CM | POA: Diagnosis not present

## 2022-09-15 DIAGNOSIS — E669 Obesity, unspecified: Secondary | ICD-10-CM

## 2022-09-15 DIAGNOSIS — I152 Hypertension secondary to endocrine disorders: Secondary | ICD-10-CM | POA: Diagnosis not present

## 2022-09-15 DIAGNOSIS — E1159 Type 2 diabetes mellitus with other circulatory complications: Secondary | ICD-10-CM

## 2022-09-15 NOTE — Patient Instructions (Signed)
It was a pleasure to see you today.  Thank you for giving us the opportunity to be involved in your care.  Below is a brief recap of your visit and next steps.  We will plan to see you again in 3 months.  Summary No medication changes today. We will plan for follow up in 3 months.   

## 2022-09-15 NOTE — Progress Notes (Unsigned)
Established Patient Office Visit  Subjective   Patient ID: MARINDA TYER, female    DOB: 11-06-1965  Age: 57 y.o. MRN: [id number]  Chief Complaint  Patient presents with   Hypertension    Follow up   Diabetes    Follow up   Ms. Setter returns to care today for hypertension and diabetes follow-up.  She was last seen by me on 1/8 at which time her blood pressure was mildly elevated.  No medication changes were made and 4-week follow-up was arranged for HTN check.  She has kept a blood pressure log in the interim.  She additionally endorses a history of chronic diarrhea the onset of which seem to correlate with when metformin was increased.  She was switched to metformin XR.  Unfortunately, she experienced side effects of nausea, flatulence, and a sour taste in her mouth.  She has since resumed regular metformin.  Ms. Jamil reports feeling well today.  She is asymptomatic and has no additional concerns to discuss.  Past Medical History:  Diagnosis Date   Arthritis    right hip and knee pain; had right ankle fusion   Back pain    Diabetes mellitus without complication (HCC)    Edema, lower extremity    Hip pain    Hyperlipidemia    Hypertension    Joint pain    Knee pain    Past Surgical History:  Procedure Laterality Date   ANKLE FUSION Right    APPENDECTOMY     CHOLECYSTECTOMY N/A 03/21/2016   Procedure: LAPAROSCOPIC CHOLECYSTECTOMY;  Surgeon: Aviva Signs, MD;  Location: [facility];  Service: General;  Laterality: N/A;   TOTAL ABDOMINAL HYSTERECTOMY     Social History   Tobacco Use   Smoking status: Never   Smokeless tobacco: Never  Vaping Use   Vaping Use: Never used  Substance Use Topics   Alcohol use: No   Drug use: No   Family History  Problem Relation Age of Onset   Diabetes Mother    High blood pressure Mother    Stroke Mother    Obesity Mother    Sudden death Father    Allergies  Allergen Reactions   Penicillins     Develops yeast infections  when on medication   Review of Systems  Constitutional:  Negative for chills and fever.  HENT:  Negative for sore throat.   Respiratory:  Negative for cough and shortness of breath.   Cardiovascular:  Negative for chest pain, palpitations and leg swelling.  Gastrointestinal:  Negative for abdominal pain, blood in stool, constipation, diarrhea, nausea and vomiting.  Genitourinary:  Negative for dysuria and hematuria.  Musculoskeletal:  Negative for myalgias.  Skin:  Negative for itching and rash.  Neurological:  Negative for dizziness and headaches.  Psychiatric/Behavioral:  Negative for depression and suicidal ideas.      Objective:     BP 132/70   Pulse (!) 105   Ht 5\' 6"  (1.676 m)   Wt 241 lb (109.3 kg)   SpO2 91%   BMI 38.90 kg/m  BP Readings from Last 3 Encounters:  09/15/22 132/70  09/08/22 133/80  08/18/22 130/80   Physical Exam Vitals reviewed.  Constitutional:      General: She is not in acute distress.    Appearance: Normal appearance. She is obese. She is not toxic-appearing.  HENT:     Head: Normocephalic and atraumatic.     Right Ear: External ear normal.     Left Ear:  External ear normal.     Nose: Nose normal. No congestion or rhinorrhea.     Mouth/Throat:     Mouth: Mucous membranes are moist.     Pharynx: Oropharynx is clear. No oropharyngeal exudate or posterior oropharyngeal erythema.  Eyes:     General: No scleral icterus.    Conjunctiva/sclera: Conjunctivae normal.     Pupils: Pupils are equal, round, and reactive to light.  Cardiovascular:     Rate and Rhythm: Normal rate and regular rhythm.  Pulmonary:     Effort: Pulmonary effort is normal.     Breath sounds: Normal breath sounds. No wheezing, rhonchi or rales.  Abdominal:     General: Abdomen is flat. Bowel sounds are normal. There is no distension.     Palpations: Abdomen is soft.     Tenderness: There is no abdominal tenderness.  Musculoskeletal:        General: Normal range of  motion.     Cervical back: Normal range of motion.  Lymphadenopathy:     Cervical: No cervical adenopathy.  Skin:    General: Skin is warm and dry.     Capillary Refill: Capillary refill takes less than 2 seconds.     Coloration: Skin is not jaundiced.  Neurological:     General: No focal deficit present.     Mental Status: She is alert and oriented to person, place, and time.  Psychiatric:        Mood and Affect: Mood normal.        Behavior: Behavior normal.        Thought Content: Thought content normal.   Last CBC Lab Results  Component Value Date   WBC 4.8 03/18/2022   HGB 11.7 03/18/2022   HCT 35.0 03/18/2022   MCV 88 03/18/2022   MCH 29.4 03/18/2022   RDW 12.4 03/18/2022   PLT 209 25/85/2778   Last metabolic panel Lab Results  Component Value Date   GLUCOSE 99 03/18/2022   NA 140 03/18/2022   K 4.6 03/18/2022   CL 100 03/18/2022   CO2 25 03/18/2022   BUN 16 03/18/2022   CREATININE 0.73 03/18/2022   EGFR 97 03/18/2022   CALCIUM 9.4 03/18/2022   PHOS 2.3 (L) 03/22/2016   PROT 7.1 03/18/2022   ALBUMIN 4.6 03/18/2022   LABGLOB 2.5 03/18/2022   AGRATIO 1.8 03/18/2022   BILITOT 0.3 03/18/2022   ALKPHOS 47 03/18/2022   AST 12 03/18/2022   ALT 11 03/18/2022   ANIONGAP 11 11/28/2017   Last lipids Lab Results  Component Value Date   CHOL 129 03/18/2022   HDL 58 03/18/2022   LDLCALC 53 03/18/2022   TRIG 98 03/18/2022   CHOLHDL 2.2 03/18/2022   Last hemoglobin A1c Lab Results  Component Value Date   HGBA1C 6.0 08/18/2022   Last thyroid functions Lab Results  Component Value Date   TSH 1.650 03/18/2022   Last vitamin D Lab Results  Component Value Date   VD25OH 48.3 03/18/2022   Last vitamin B12 and Folate Lab Results  Component Value Date   VITAMINB12 378 08/22/2021     Assessment & Plan:   Problem List Items Addressed This Visit       Hypertension associated with type 2 diabetes mellitus ([place]) - Primary    Presenting today for HTN  follow-up.  She is currently prescribed amlodipine 5 mg daily and valsartan-HCTZ 320-25 mg daily for treatment of hypertension.  Her blood pressure today is 132/70.  She has kept  a BP log since her last appointment, which shows that her home readings are consistently within goal ( < 130/80). -No medication changes today given adequately controlled HTN based on home readings      Type 2 diabetes mellitus with obesity (HCC)    Metformin was switched to extended release 1 month ago in case this was contributing to chronic diarrhea.  Unfortunately, she experienced side effects of nausea, flatulence, and a sour taste in her mouth.  The symptoms have resolved since she resumed regular metformin.  She is not interested in making any further medication changes.  POC A1c 6.0 last month. -No medication changes today.  Continue Victoza and metformin as previously prescribed.      Return in about 3 months (around 12/14/2022).    Johnette Abraham, MD

## 2022-09-17 NOTE — Telephone Encounter (Signed)
error 

## 2022-09-18 MED ORDER — METFORMIN HCL 1000 MG PO TABS: 1000.0000 mg | ORAL_TABLET | Freq: Two times a day (BID) | ORAL | Status: DC

## 2022-09-18 NOTE — Assessment & Plan Note (Signed)
Presenting today for HTN follow-up.  She is currently prescribed amlodipine 5 mg daily and valsartan-HCTZ 320-25 mg daily for treatment of hypertension.  Her blood pressure today is 132/70.  She has kept a BP log since her last appointment, which shows that her home readings are consistently within goal ( < 130/80). -No medication changes today given adequately controlled HTN based on home readings

## 2022-09-18 NOTE — Assessment & Plan Note (Signed)
Metformin was switched to extended release 1 month ago in case this was contributing to chronic diarrhea.  Unfortunately, she experienced side effects of nausea, flatulence, and a sour taste in her mouth.  The symptoms have resolved since she resumed regular metformin.  She is not interested in making any further medication changes.  POC A1c 6.0 last month. -No medication changes today.  Continue Victoza and metformin as previously prescribed.

## 2022-09-25 NOTE — Progress Notes (Signed)
TeleHealth Visit:  This visit was completed with telemedicine (audio/video) technology. Terri [name] has verbally consented to this TeleHealth visit. The patient is located at home, the provider is located at home. The participants in this visit include the listed provider and patient. The visit was conducted today via MyChart video.  OBESITY Terri [name] is here to discuss her progress with her obesity treatment plan along with follow-up of her obesity related diagnoses.   Today's visit was # 20 Starting weight: 255 lbs Starting date: 08/22/2021 Weight at last in office visit: 238 lbs on 09/08/22 Total weight loss: 17 lbs at last in office visit on 09/08/22. Today's reported weight: 237 lbs    Nutrition Plan: Category 2 Plan and keeping a food journal and adhering to recommended goals of 1000-1100 calories and 80 protein daily.   Current exercise: 20 minutes 3 days per week -exercise bike  Interim History:  Terri [name] is journaling daily and meeting both calorie and protein goals.  She was only off plan on one day-Valentine's Day.  She is using category 2 as a guideline. Appetite is satisfied.  She is prepping all of her lunches for the week on Sunday.   She had a recent non-scale victory that clothes that were previously too tight in the waist are now too big. She is realizing that this is a lifestyle not a diet.  She was amazed at how many calories a [employer] cobb salad has and says from now on she will make her salads at home.  Has been riding her exercise bike and recently purchased a treadmill.  She is motivated to gradually increase her exercise frequency.  Assessment/Plan:  1.  Hypertension associated with type 2 diabetes Hypertension reasonably well controlled.  She saw her PCP on February 5 and blood pressure was 132/70.  He would like tighter blood pressure control and discussed increasing amlodipine dose.  They agreed together to wait until May at her next follow-up and see what  improvement she has been able to make with lifestyle interventions. Medication(s): Amlodipine 5 mg daily, valsartan-HCTZ 320-25 mg daily.  BP Readings from Last 3 Encounters:  09/15/22 132/70  09/08/22 133/80  08/18/22 130/80   Lab Results  Component Value Date   CREATININE 0.73 03/18/2022   CREATININE 0.65 11/12/2021   CREATININE 0.73 06/27/2021   No results found for: "GFR"  Plan: Continue all antihypertensives at current dosages.   2. 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 Last A1c was 6.2 on 03/18/2022.  C was 7.4 in July 2022 so she has made a lot of progress. Medication(s): Metformin 1000 mg 2 times daily with a meal, Victoza 1.8 mg daily. Tolerating the Victoza well.  Appetite well-controlled.  Lab Results  Component Value Date   HGBA1C 6.0 08/18/2022   HGBA1C 6.2 (H) 03/18/2022   HGBA1C 6.4 (H) 11/12/2021   Lab Results  Component Value Date   LDLCALC 53 03/18/2022   CREATININE 0.73 03/18/2022   No results found for: "GFR"  Plan: Continue metformin 1000 mg twice daily with meals and Victoza 1.8 mg subcu daily.   3. Obesity: Current BMI 38 Terri [name] is currently in the action stage of change. As such, her goal is to continue with weight loss efforts.  She has agreed to Category 2 Plan and keeping a food journal and adhering to recommended goals of 1000-1100 calories and 80 protein daily. .  Exercise goals: Gradually increase frequency of exercise.  Behavioral modification strategies: increasing  lean protein intake, decreasing simple carbohydrates , decrease eating out, meal planning , and planning for success.  Terri [name] has agreed to follow-up with our clinic in 3 weeks.   No orders of the defined types were placed in this encounter.   There are no discontinued medications.   No orders of the defined types were placed in this encounter.     Objective:   VITALS: Per patient if applicable, see  vitals. GENERAL: Alert and in no acute distress. CARDIOPULMONARY: No increased WOB. Speaking in clear sentences.  PSYCH: Pleasant and cooperative. Speech normal rate and rhythm. Affect is appropriate. Insight and judgement are appropriate. Attention is focused, linear, and appropriate.  NEURO: Oriented as arrived to appointment on time with no prompting.   Lab Results  Component Value Date   CREATININE 0.73 03/18/2022   BUN 16 03/18/2022   NA 140 03/18/2022   K 4.6 03/18/2022   CL 100 03/18/2022   CO2 25 03/18/2022   Lab Results  Component Value Date   ALT 11 03/18/2022   AST 12 03/18/2022   ALKPHOS 47 03/18/2022   BILITOT 0.3 03/18/2022   Lab Results  Component Value Date   HGBA1C 6.0 08/18/2022   HGBA1C 6.2 (H) 03/18/2022   HGBA1C 6.4 (H) 11/12/2021   HGBA1C 7.3 (H) 06/27/2021   HGBA1C 7.4 (H) 02/28/2021   No results found for: "INSULIN" Lab Results  Component Value Date   TSH 1.650 03/18/2022   Lab Results  Component Value Date   CHOL 129 03/18/2022   HDL 58 03/18/2022   LDLCALC 53 03/18/2022   TRIG 98 03/18/2022   CHOLHDL 2.2 03/18/2022   Lab Results  Component Value Date   WBC 4.8 03/18/2022   HGB 11.7 03/18/2022   HCT 35.0 03/18/2022   MCV 88 03/18/2022   PLT 209 03/18/2022   No results found for: "IRON", "TIBC", "FERRITIN" Lab Results  Component Value Date   VD25OH 48.3 03/18/2022   VD25OH 42.6 08/22/2021    Attestation Statements:   Reviewed by clinician on day of visit: allergies, medications, problem list, medical history, surgical history, family history, social history, and previous encounter notes.  Time spent on visit including the items listed below was 30 minutes.  -preparing to see the patient (e.g., review of tests, history, previous notes) -obtaining and/or reviewing separately obtained history -counseling and educating the patient/family/caregiver -documenting clinical information in the electronic or other health record -ordering  medications, tests, or procedures -independently interpreting results and communicating results to the patient/ family/caregiver -referring and communicating with other health care professionals  -care coordination   This was prepared with the assistance of [employer].  Occasional wrong-word or sound-a-like substitutions may have occurred due to the inherent limitations of voice recognition software.

## 2022-09-29 ENCOUNTER — Telehealth (INDEPENDENT_AMBULATORY_CARE_PROVIDER_SITE_OTHER): Payer: BC Managed Care – PPO | Admitting: Family Medicine

## 2022-09-29 ENCOUNTER — Encounter (INDEPENDENT_AMBULATORY_CARE_PROVIDER_SITE_OTHER): Payer: Self-pay | Admitting: Family Medicine

## 2022-09-29 DIAGNOSIS — E1159 Type 2 diabetes mellitus with other circulatory complications: Secondary | ICD-10-CM | POA: Diagnosis not present

## 2022-09-29 DIAGNOSIS — E1129 Type 2 diabetes mellitus with other diabetic kidney complication: Secondary | ICD-10-CM

## 2022-09-29 DIAGNOSIS — I152 Hypertension secondary to endocrine disorders: Secondary | ICD-10-CM | POA: Diagnosis not present

## 2022-09-29 DIAGNOSIS — Z7984 Long term (current) use of oral hypoglycemic drugs: Secondary | ICD-10-CM

## 2022-09-29 DIAGNOSIS — R809 Proteinuria, unspecified: Secondary | ICD-10-CM

## 2022-09-29 DIAGNOSIS — E669 Obesity, unspecified: Secondary | ICD-10-CM

## 2022-09-29 DIAGNOSIS — Z6838 Body mass index (BMI) 38.0-38.9, adult: Secondary | ICD-10-CM

## 2022-09-30 NOTE — Progress Notes (Unsigned)
Chief Complaint:   OBESITY Terri [name] is here to discuss her progress with her obesity treatment plan along with follow-up of her obesity related diagnoses. Terri [name] is on the Category 2 Plan and keeping a food journal and adhering to recommended goals of 1000-1100 calories and 80 grams of protein and states she is following her eating plan approximately 85% of the time. Terri [name] states she is bike for 20 minutes 3 times per week.  Today's visit was #: 58 Starting weight: 255 lbs Starting date: 08/22/2021 Today's weight: 238 lbs Today's date: 09/08/2022 Total lbs lost to date: 17 Total lbs lost since last in-office visit: 0  Interim History: Terri [name] is using the exercise bike that she got for Christmas, 3 days per week for 20 minutes. She noticed eating out can really ruin her whole week.   Subjective:   1. Type 2 diabetes mellitus with obesity (Terri [name]) Terri [name]'s recent A1c was 6.0 and fasting blood sugars very stable and brought in for my review. Asymptomatic, not sure she can decrease calories at this time, as she is too hungry. No side effects were noted.   2. Hypertension associated with type 2 diabetes mellitus ([name]) Terri [name] is asymptomatic with no concerns. She is tolerating medications well.   Assessment/Plan:  No orders of the defined types were placed in this encounter.   Medications Discontinued During This Encounter  Medication Reason   liraglutide (VICTOZA) 18 MG/3ML SOPN Dose change     Meds ordered this encounter  Medications   liraglutide (VICTOZA) 18 MG/3ML SOPN    Sig: Inject 1.8 mg into the skin daily.    Dispense:  9 mL    Refill:  0     1. Type 2 diabetes mellitus with obesity (HCC) Increase Victoza from 1.2 mg to 1.8 mg daily. Continue metformin, increase exercise, modifications to meal plan as advised.   - liraglutide (VICTOZA) 18 MG/3ML SOPN; Inject 1.8 mg into the skin daily.  Dispense: 9 mL; Refill: 0  2. Hypertension associated with type 2 diabetes  mellitus (HCC) Blood pressure at goal. Continue Norvasc and Diovan, increase water, and decrease salt.   3. Start BMI 41.16  4. Current BMI 38.5 Terri [name] is currently in the action stage of change. As such, her goal is to continue with weight loss efforts. She has agreed to the Category 2 Plan and keeping a food journal and adhering to recommended goals of 1000-1100 calories and 80 grams of protein.   Avoid low sugars when working out approximately 4 pm, she will make 2 smaller sandwiches and have 1 at noon and around 3 pm or so. Also discussed healthy snacks.   Exercise goals: For substantial health benefits, adults should do at least 150 minutes (2 hours and 30 minutes) a week of moderate-intensity, or 75 minutes (1 hour and 15 minutes) a week of vigorous-intensity aerobic physical activity, or an equivalent combination of moderate- and vigorous-intensity aerobic activity. Aerobic activity should be performed in episodes of at least 10 minutes, and preferably, it should be spread throughout the week. Increase to 5 days per week as tolerated.   Behavioral modification strategies: increasing lean protein intake, decreasing simple carbohydrates, and keeping a strict food journal.  Terri [name] has agreed to follow-up with our clinic in 3 weeks with [employer], FNP-C. She was informed of the importance of frequent follow-up visits to maximize her success with intensive lifestyle modifications for her multiple health conditions.   Objective:   Blood pressure 133/80, pulse 94,  temperature 97.8 F (36.6 C), height 5' 6"$  (1.676 m), weight 238 lb 6.4 oz (108.1 kg), SpO2 98 %. Body mass index is 38.48 kg/m.  General: Cooperative, alert, well developed, in no acute distress. HEENT: Conjunctivae and lids unremarkable. Cardiovascular: Regular rhythm.  Lungs: Normal work of breathing. Neurologic: No focal deficits.   Lab Results  Component Value Date   CREATININE 0.73 03/18/2022   BUN 16 03/18/2022    NA 140 03/18/2022   K 4.6 03/18/2022   CL 100 03/18/2022   CO2 25 03/18/2022   Lab Results  Component Value Date   ALT 11 03/18/2022   AST 12 03/18/2022   ALKPHOS 47 03/18/2022   BILITOT 0.3 03/18/2022   Lab Results  Component Value Date   HGBA1C 6.0 08/18/2022   HGBA1C 6.2 (H) 03/18/2022   HGBA1C 6.4 (H) 11/12/2021   HGBA1C 7.3 (H) 06/27/2021   HGBA1C 7.4 (H) 02/28/2021   No results found for: "INSULIN" Lab Results  Component Value Date   TSH 1.650 03/18/2022   Lab Results  Component Value Date   CHOL 129 03/18/2022   HDL 58 03/18/2022   LDLCALC 53 03/18/2022   TRIG 98 03/18/2022   CHOLHDL 2.2 03/18/2022   Lab Results  Component Value Date   VD25OH 48.3 03/18/2022   VD25OH 42.6 08/22/2021   Lab Results  Component Value Date   WBC 4.8 03/18/2022   HGB 11.7 03/18/2022   HCT 35.0 03/18/2022   MCV 88 03/18/2022   PLT 209 03/18/2022   No results found for: "IRON", "TIBC", "FERRITIN"  Attestation Statements:   Reviewed by clinician on day of visit: allergies, medications, problem list, medical history, surgical history, family history, social history, and previous encounter notes.   Wilhemena Durie, am acting as transcriptionist for [employer], DO.   I have reviewed the above documentation for accuracy and completeness, and I agree with the above. Marjory Sneddon, D.O.  The [place] was signed into law in 2016 which includes the topic of electronic health records.  This provides immediate access to information in MyChart.  This includes consultation notes, operative notes, office notes, lab results and pathology reports.  If you have any questions about what you read please let us know at your next visit so we can discuss your concerns and take corrective action if need be.  We are right here with you.

## 2022-10-04 ENCOUNTER — Other Ambulatory Visit: Payer: Self-pay | Admitting: Internal Medicine

## 2022-10-04 DIAGNOSIS — E1169 Type 2 diabetes mellitus with other specified complication: Secondary | ICD-10-CM

## 2022-10-10 ENCOUNTER — Other Ambulatory Visit (INDEPENDENT_AMBULATORY_CARE_PROVIDER_SITE_OTHER): Payer: Self-pay | Admitting: Family Medicine

## 2022-10-10 DIAGNOSIS — E669 Obesity, unspecified: Secondary | ICD-10-CM

## 2022-10-20 ENCOUNTER — Encounter (INDEPENDENT_AMBULATORY_CARE_PROVIDER_SITE_OTHER): Payer: Self-pay | Admitting: Family Medicine

## 2022-10-20 ENCOUNTER — Ambulatory Visit (INDEPENDENT_AMBULATORY_CARE_PROVIDER_SITE_OTHER): Payer: BC Managed Care – PPO | Admitting: Family Medicine

## 2022-10-20 VITALS — BP 141/85 | HR 94 | Temp 98.4°F | Ht 66.0 in | Wt 237.2 lb

## 2022-10-20 DIAGNOSIS — E1169 Type 2 diabetes mellitus with other specified complication: Secondary | ICD-10-CM

## 2022-10-20 DIAGNOSIS — E559 Vitamin D deficiency, unspecified: Secondary | ICD-10-CM | POA: Diagnosis not present

## 2022-10-20 DIAGNOSIS — Z7985 Long-term (current) use of injectable non-insulin antidiabetic drugs: Secondary | ICD-10-CM

## 2022-10-20 DIAGNOSIS — Z6838 Body mass index (BMI) 38.0-38.9, adult: Secondary | ICD-10-CM

## 2022-10-20 DIAGNOSIS — Z7984 Long term (current) use of oral hypoglycemic drugs: Secondary | ICD-10-CM

## 2022-10-20 DIAGNOSIS — E1159 Type 2 diabetes mellitus with other circulatory complications: Secondary | ICD-10-CM | POA: Diagnosis not present

## 2022-10-20 DIAGNOSIS — F5089 Other specified eating disorder: Secondary | ICD-10-CM

## 2022-10-20 DIAGNOSIS — I152 Hypertension secondary to endocrine disorders: Secondary | ICD-10-CM | POA: Diagnosis not present

## 2022-10-20 MED ORDER — VICTOZA 18 MG/3ML ~~LOC~~ SOPN: 1.8000 mg | PEN_INJECTOR | Freq: Every day | SUBCUTANEOUS | 0 refills | Status: DC

## 2022-10-20 NOTE — Progress Notes (Signed)
Terri [name], D.O.  ABFM, ABOM Specializing in Clinical Bariatric Medicine  Office located at: [address] [place], [place]  [address]     Assessment and Plan:   Orders Placed This Encounter  Procedures   VITAMIN D 25 Hydroxy (Vit-D Deficiency, Fractures)    Medications Discontinued During This Encounter  Medication Reason   liraglutide (VICTOZA) 18 MG/3ML SOPN Reorder     Meds ordered this encounter  Medications   liraglutide (VICTOZA) 18 MG/3ML SOPN    Sig: Inject 1.8 mg into the skin daily.    Dispense:  9 mL    Refill:  0    Will check Vit D level at next visit.   Type 2 diabetes mellitus with obesity (HCC) Assessment: Condition is At goal.. Labs were reviewed.  Lab Results  Component Value Date   HGBA1C 6.0 08/18/2022   HGBA1C 6.2 (H) 03/18/2022   HGBA1C 6.4 (H) 11/12/2021  Her fasting blood sugars have been well controlled between 87-116. She has been compliant with Metformin 1000mg  BID with good tolerance. She is no longer on any insulin. She has been tolerating Victoza 1.8mg  daily well with good compliance.  Plan: Continue Metformin 1000mg  BID. Continue Victoza 1.8mg  daily- refill provided today. We discussed alterative options including Mounjaro and Ozempic- she would like to contact her insurance and see if either of these will be covered in the event that she wants to try other options at some point. Her insurance previously denied prior authorization for [facility]. She stopped taking Ozempic in the past due to supply shortages.   BMI 38.0-38.9,adult-current bmi 38.3 Morbid obesity (HCC)-start bmi 41.16/date 08/22/21 Assessment: Condition is Improving, but not optimized.. Biometric data collected today, was reviewed with patient.  Fat mass is up 1lb, muscle mass is down 2lbs, and her total body water is up .4lb. She has been tolerating Victoza 1.8mg  daily well with good compliance.  Plan:Continue Victoza 1.8mg  daily- refill provided  today. Continue prudent nutritional plan- continue category 2 meal plan keeping a food journal and adhering to recommended goals of 1000-1100 calories and 80 protein.   Eating disorder/Emotional eating Assessment: Condition is Controlled..  Cravings have been well controlled when she follows meal plan. She has seen improvement of cravings with Victoza. She reports some emotional eating this past week surrounding her uncle's funeral.  Plan: Continue to work on mindful eating habits and meet daily protein intake goals to mitigate cravings.   Hypertension associated with type 2 diabetes mellitus (HCC) Assessment: Condition is Controlled.. She states that her BP at home has been well controlled. She has been compliance with Norvasc 5mg  daily and Diovan-HCT 320-25mg  daily.  BP Readings from Last 3 Encounters:  10/20/22 (!) 141/85  09/15/22 132/70  09/08/22 133/80   Plan: Continue Norvasc and Diovan as recommended by her PCP. Continue to monitor her BP at home.   Vitamin D deficiency Assessment: Condition is At goal.. Labs were reviewed. Lab Results  Component Value Date   VD25OH 48.3 03/18/2022  She has been compliant with D3 1K IU daily without negative side effects.  Plan:Continue OTC Vitamin D3. Will check Vit D level at next visit.     TREATMENT PLAN FOR OBESITY:  Recommended Dietary Goals [name] is currently in the action stage of change. As such, her goal is to continue weight management plan. She has agreed to continue category 2 meal plan keeping a food journal and adhering to recommended goals of 1000-1100 calories and 80 protein.  Behavioral  Intervention We discussed the following Behavioral Modification Strategies today: increasing lean protein intake, increasing water intake, work on meal planning and easy cooking plans, work on tracking and journaling calories using tracking App, emotional eating strategies and understanding the difference between hunger signals and  cravings, and work on managing stress, creating time for self-care and relaxation measures. Additional resources provided today:  patient declined Evidence-based interventions for health behavior change were utilized today including the discussion of self monitoring techniques, problem-solving barriers and SMART goal setting techniques.   Regarding patient's less desirable eating habits and patterns, we employed the technique of small changes.  Pt will specifically work on: She would like to be more compliant with the meal plan- specifically meal prepping and begin exercising for next visit.    Recommended Physical Activity Goals Noelani has been advised to work up to 150 minutes of moderate intensity aerobic activity a week and strengthening exercises 2-3 times per week for cardiovascular health, weight loss maintenance and preservation of muscle mass.  She has agreed to increase physical activity in their day and reduce sedentary time (increase NEAT).    FOLLOW UP: Return in about 6 weeks (around 12/01/2022).Marland Kitchen She was informed of the importance of frequent follow up visits to maximize her success with intensive lifestyle modifications for her multiple health conditions.  Weight Summary and Biometrics   Weight Lost Since Last Visit: 1lb  No data recorded  Vitals Temp: 98.4 F (36.9 C) BP: (!) 141/85 Pulse Rate: 94 SpO2: 99 %   Anthropometric Measurements Height: 5\' 6"  (1.676 m) Weight: 237 lb 3.2 oz (107.6 kg) BMI (Calculated): 38.3 Weight at Last Visit: 238lb Weight Lost Since Last Visit: 1lb Starting Weight: 255lb Total Weight Loss (lbs): 18 lb (8.165 kg) Peak Weight: 260lb   Body Composition  Body Fat %: 49.7 % Fat Mass (lbs): 117.8 lbs Muscle Mass (lbs): 113.4 lbs Total Body Water (lbs): 92 lbs Visceral Fat Rating : 15   Other Clinical Data Fasting: no Labs: no Today's Visit #: 21 Starting Date: 08/22/21    Subjective:   Chief complaint: Obesity [name]  is here to discuss her progress with her obesity treatment plan. She is on the category 2 meal plan keeping a food journal and adhering to recommended goals of 1000-1100 calories and 80 protein. and states she is following her eating plan approximately 100 % of the time. She states she is exercising on the treadmill 25 minutes 2 days per week.  Interval History:  JONNELL KUCINSKI is here for a follow up office visit.  We reviewed her meal plan and all questions were answered.  Patient's food recall appears to be accurate and consistent with what is on plan when she is following it.   When eating on plan, her hunger and cravings are well controlled.   Since last office visit she did well overall following her meal plan. She brought in her food journal today and noticed that she was over on calories with recent passing of her uncle.  Her fasting blood sugars have been well controlled between 87-116.  Pharmacotherapy for weight loss: She is currently taking  Victoza 1.8mg  daily  for medical weight loss.  Denies side effects.  She feels that her cravings are well controlled.   Review of Systems:  Pertinent positives were addressed with patient today.  Objective:   PHYSICAL EXAM:  Blood pressure (!) 141/85, pulse 94, temperature 98.4 F (36.9 C), height 5\' 6"  (1.676 m), weight 237 lb 3.2 oz (  107.6 kg), SpO2 99 %. Body mass index is 38.29 kg/m.  General: Well Developed, well nourished, and in no acute distress.  HEENT: Normocephalic, atraumatic Skin: Warm and dry, cap RF less 2 sec, good turgor Chest:  Normal excursion, shape, no gross abn Respiratory: speaking in full sentences, no conversational dyspnea NeuroM-Sk: Ambulates w/o assistance, moves * 4 Psych: A and O *3, insight good, mood-full   DIAGNOSTIC DATA REVIEWED:  BMET    Component Value Date/Time   NA 140 03/18/2022 0812   K 4.6 03/18/2022 0812   CL 100 03/18/2022 0812   CO2 25 03/18/2022 0812   GLUCOSE 99 03/18/2022  0812   GLUCOSE 125 (H) 11/28/2017 2010   BUN 16 03/18/2022 0812   CREATININE 0.73 03/18/2022 0812   CALCIUM 9.4 03/18/2022 0812   GFRNONAA >60 11/28/2017 2010   GFRAA >60 11/28/2017 2010   Lab Results  Component Value Date   HGBA1C 6.0 08/18/2022   HGBA1C 7.4 (H) 02/28/2021   No results found for: "INSULIN" Lab Results  Component Value Date   TSH 1.650 03/18/2022   CBC    Component Value Date/Time   WBC 4.8 03/18/2022 0812   WBC 6.6 11/28/2017 2010   RBC 3.98 03/18/2022 0812   RBC 4.31 11/28/2017 2010   HGB 11.7 03/18/2022 0812   HCT 35.0 03/18/2022 0812   PLT 209 03/18/2022 0812   MCV 88 03/18/2022 0812   MCH 29.4 03/18/2022 0812   MCH 29.0 11/28/2017 2010   MCHC 33.4 03/18/2022 0812   MCHC 30.9 11/28/2017 2010   RDW 12.4 03/18/2022 0812   Iron Studies No results found for: "IRON", "TIBC", "FERRITIN", "IRONPCTSAT" Lipid Panel     Component Value Date/Time   CHOL 129 03/18/2022 0812   TRIG 98 03/18/2022 0812   HDL 58 03/18/2022 0812   CHOLHDL 2.2 03/18/2022 0812   LDLCALC 53 03/18/2022 0812   Hepatic Function Panel     Component Value Date/Time   PROT 7.1 03/18/2022 0812   ALBUMIN 4.6 03/18/2022 0812   AST 12 03/18/2022 0812   ALT 11 03/18/2022 0812   ALKPHOS 47 03/18/2022 0812   BILITOT 0.3 03/18/2022 0812   BILIDIR 0.2 03/24/2016 0650   IBILI 0.5 03/24/2016 0650      Component Value Date/Time   TSH 1.650 03/18/2022 4098   Nutritional Lab Results  Component Value Date   VD25OH 48.3 03/18/2022   VD25OH 42.6 08/22/2021    Attestations:   Reviewed by clinician on day of visit: allergies, medications, problem list, medical history, surgical history, family history, social history, and previous encounter notes.  I,Alexis Herring,acting as a Neurosurgeon for [employer], DO.,have documented all relevant documentation on the behalf of Thomasene Lot, DO,as directed by  Thomasene Lot, DO while in the presence of Thomasene Lot, DO.   I, Thomasene Lot, DO, have reviewed all documentation for this visit. The documentation on 10/20/22 for the exam, diagnosis, procedures, and orders are all accurate and complete.

## 2022-10-21 ENCOUNTER — Other Ambulatory Visit (INDEPENDENT_AMBULATORY_CARE_PROVIDER_SITE_OTHER): Payer: Self-pay | Admitting: Family Medicine

## 2022-10-21 DIAGNOSIS — E1129 Type 2 diabetes mellitus with other diabetic kidney complication: Secondary | ICD-10-CM

## 2022-11-04 ENCOUNTER — Other Ambulatory Visit: Payer: Self-pay | Admitting: Nurse Practitioner

## 2022-11-04 DIAGNOSIS — I10 Essential (primary) hypertension: Secondary | ICD-10-CM

## 2022-11-06 NOTE — Progress Notes (Signed)
TeleHealth Visit:  This visit was completed with telemedicine (audio/video) technology. Terri [name] has verbally consented to this TeleHealth visit. The patient is located at home, the provider is located at home. The participants in this visit include the listed provider and patient. The visit was conducted today via MyChart video.  OBESITY Terri [name] is here to discuss her progress with her obesity treatment plan along with follow-up of her obesity related diagnoses.   Today's visit was # 22 Starting weight: 08/22/21 lbs Starting date: 20 Weight at last in office visit: 237 lbs on 10/20/22 Total weight loss: 18 lbs at last in office visit on 10/20/22. Today's reported weight (11/10/22):  234 lbs  Nutrition Plan: the Category 2 plan and keeping a food journal with goal of 1000-1100 calories and 80 grams of protein daily   Current exercise:  [employer]-treadmill 3 days per week for 15-20 minutes.  Interim History:  Sometimes slightly over on calories. She is meeting protein goals. Using the [employer] chicken patties on 45 cal bread as a meal.  She is happy with her progress.  Has lost 3 pounds since March 11. She was able to wear a dress to church yesterday she had not worn in 3 years.   Eating all of the prescribed protein: yes Skipping meals: No Journaling Consistently:  Yes Meeting protein goals:  Yes Meeting calorie goals: Sometimes a bit over  Pharmacotherapy: Terri [name] is on Victoza 1.8 mg SQ daily Adverse side effects: None Hunger is well controlled.  Cravings are well controlled.  Assessment/Plan:  1. Type 2 Diabetes Mellitus with microalbuminuria, without long-term current use of insulin HgbA1c is at goal. Last A1c was 6.0 on 08/18/22. CBGs: Fasting 80-90 Episodes of hypoglycemia: no Medication(s): Victoza 1.8 mg SQ daily and metformin 1000 BID. Was on insulin previously.  Lab Results  Component Value Date   HGBA1C 6.0 08/18/2022   HGBA1C 6.2 (H) 03/18/2022   HGBA1C 6.4  (H) 11/12/2021   Lab Results  Component Value Date   LDLCALC 53 03/18/2022   CREATININE 0.73 03/18/2022   No results found for: "GFR"  Plan: Continue and refill Victoza 1.8 mg subcu daily and metformin 1000 mg twice daily. Consider switching to [facility] when supply improves.  2. Hypertension associated with type 2 diabetes Hypertension reasonably well controlled.  Elevated at last in office visit (141/85) but usually ranges 130s over 80s. Medication(s): Amlodipine 5 mg daily, valsartan HCTZ 320-25 mg daily  BP Readings from Last 3 Encounters:  10/20/22 (!) 141/85  09/15/22 132/70  09/08/22 133/80   Lab Results  Component Value Date   CREATININE 0.73 03/18/2022   CREATININE 0.65 11/12/2021   CREATININE 0.73 06/27/2021   No results found for: "GFR"  Plan: Continue all antihypertensives at current dosages.  3. Morbid Obesity: Current BMI 38 Pharmacotherapy Plan Continue and refill  Victoza 1.8 mg SQ daily Terri [name] is currently in the action stage of change. As such, her goal is to continue with weight loss efforts.  She has agreed to keeping a food journal with goal of 1000-1100 calories and 80 grams of protein daily.  Exercise goals: Continue walking on treadmill.  Encouraged her to slowly increase incline and increase length to 30 minutes.  Discussed starting to use the weight machines at the [facility].  Behavioral modification strategies: increasing lean protein intake, decreasing simple carbohydrates , planning for success, and keep a strict food journal.  Terri [name] has agreed to follow-up with our clinic in 3 weeks.   No orders of the  defined types were placed in this encounter.   Medications Discontinued During This Encounter  Medication Reason   liraglutide (VICTOZA) 18 MG/3ML SOPN Reorder     Meds ordered this encounter  Medications   liraglutide (VICTOZA) 18 MG/3ML SOPN    Sig: Inject 1.8 mg into the skin daily.    Dispense:  9 mL    Refill:  0    Order  Specific Question:   Supervising Provider    Answer:   Dell Ponto [[id number]]      Objective:   VITALS: Per patient if applicable, see vitals. GENERAL: Alert and in no acute distress. CARDIOPULMONARY: No increased WOB. Speaking in clear sentences.  PSYCH: Pleasant and cooperative. Speech normal rate and rhythm. Affect is appropriate. Insight and judgement are appropriate. Attention is focused, linear, and appropriate.  NEURO: Oriented as arrived to appointment on time with no prompting.   Attestation Statements:   Reviewed by clinician on day of visit: allergies, medications, problem list, medical history, surgical history, family history, social history, and previous encounter notes.  This was prepared with the assistance of Presenter, broadcasting.  Occasional wrong-word or sound-a-like substitutions may have occurred due to the inherent limitations of voice recognition software.

## 2022-11-10 ENCOUNTER — Encounter (INDEPENDENT_AMBULATORY_CARE_PROVIDER_SITE_OTHER): Payer: Self-pay | Admitting: Family Medicine

## 2022-11-10 ENCOUNTER — Telehealth (INDEPENDENT_AMBULATORY_CARE_PROVIDER_SITE_OTHER): Payer: BC Managed Care – PPO | Admitting: Family Medicine

## 2022-11-10 DIAGNOSIS — E1159 Type 2 diabetes mellitus with other circulatory complications: Secondary | ICD-10-CM

## 2022-11-10 DIAGNOSIS — I152 Hypertension secondary to endocrine disorders: Secondary | ICD-10-CM

## 2022-11-10 DIAGNOSIS — E1129 Type 2 diabetes mellitus with other diabetic kidney complication: Secondary | ICD-10-CM

## 2022-11-10 DIAGNOSIS — Z7985 Long-term (current) use of injectable non-insulin antidiabetic drugs: Secondary | ICD-10-CM

## 2022-11-10 DIAGNOSIS — R809 Proteinuria, unspecified: Secondary | ICD-10-CM

## 2022-11-10 DIAGNOSIS — Z6838 Body mass index (BMI) 38.0-38.9, adult: Secondary | ICD-10-CM

## 2022-11-10 MED ORDER — VICTOZA 18 MG/3ML ~~LOC~~ SOPN
1.8000 mg | PEN_INJECTOR | Freq: Every day | SUBCUTANEOUS | 0 refills | Status: DC
Start: 2022-11-10 — End: 2022-12-08

## 2022-12-01 ENCOUNTER — Ambulatory Visit (INDEPENDENT_AMBULATORY_CARE_PROVIDER_SITE_OTHER): Payer: BC Managed Care – PPO | Admitting: Family Medicine

## 2022-12-08 ENCOUNTER — Encounter (INDEPENDENT_AMBULATORY_CARE_PROVIDER_SITE_OTHER): Payer: Self-pay | Admitting: Family Medicine

## 2022-12-08 ENCOUNTER — Ambulatory Visit (INDEPENDENT_AMBULATORY_CARE_PROVIDER_SITE_OTHER): Payer: BC Managed Care – PPO | Admitting: Family Medicine

## 2022-12-08 VITALS — BP 114/71 | HR 91 | Temp 98.2°F | Ht 66.0 in | Wt 239.0 lb

## 2022-12-08 DIAGNOSIS — I152 Hypertension secondary to endocrine disorders: Secondary | ICD-10-CM

## 2022-12-08 DIAGNOSIS — E559 Vitamin D deficiency, unspecified: Secondary | ICD-10-CM

## 2022-12-08 DIAGNOSIS — E1159 Type 2 diabetes mellitus with other circulatory complications: Secondary | ICD-10-CM | POA: Diagnosis not present

## 2022-12-08 DIAGNOSIS — E1129 Type 2 diabetes mellitus with other diabetic kidney complication: Secondary | ICD-10-CM | POA: Diagnosis not present

## 2022-12-08 DIAGNOSIS — R809 Proteinuria, unspecified: Secondary | ICD-10-CM

## 2022-12-08 DIAGNOSIS — F509 Eating disorder, unspecified: Secondary | ICD-10-CM

## 2022-12-08 DIAGNOSIS — Z7984 Long term (current) use of oral hypoglycemic drugs: Secondary | ICD-10-CM

## 2022-12-08 DIAGNOSIS — Z6838 Body mass index (BMI) 38.0-38.9, adult: Secondary | ICD-10-CM

## 2022-12-08 MED ORDER — TIRZEPATIDE 7.5 MG/0.5ML ~~LOC~~ SOAJ: 7.5000 mg | SUBCUTANEOUS | 0 refills | Status: DC
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
## 2022-12-15 ENCOUNTER — Other Ambulatory Visit (INDEPENDENT_AMBULATORY_CARE_PROVIDER_SITE_OTHER): Payer: Self-pay | Admitting: Family Medicine

## 2022-12-15 ENCOUNTER — Ambulatory Visit: Admitting: Internal Medicine

## 2022-12-15 DIAGNOSIS — E1129 Type 2 diabetes mellitus with other diabetic kidney complication: Secondary | ICD-10-CM

## 2022-12-18 NOTE — Progress Notes (Signed)
TeleHealth Visit:  This visit was completed with telemedicine (audio/video) technology. Carie has verbally consented to this TeleHealth visit. The patient is located at home, the provider is located at home. The participants in this visit include the listed provider and patient. The visit was conducted today via MyChart video.  OBESITY Terri [name] is here to discuss her progress with her obesity treatment plan along with follow-up of her obesity related diagnoses.   Today's visit was # 23 Starting weight: 255 lbs Starting date: 08/22/21 Weight at last in office visit: 239 lbs on 12/08/22 Total weight loss: 16 lbs at last in office visit on 12/08/22. Today's reported weight (12/22/22):  240.1 lbs  Nutrition Plan: the Category 2 plan and keeping a food journal with goal of 1000-1100 calories and 80 grams of protein daily- 80% adherence  Current exercise: 30 minutes of walking 3 days per week   Interim History:  She is journaling and following the category 2.  Reports 80% adherence to plan.  She reports snacking at home when she is working on her computer. She sometimes keeps sweets in her home when she buys some for her mother. .  Meals are on plan.  She is getting in all of the prescribed protein. 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  Eating all of the prescribed protein: yes Skipping meals: No Drinking adequate water: Yes Drinking sugar sweetened beverages: No Hunger controlled: well controlled. Cravings controlled:  well controlled.   Assessment/Plan:  1. Type 2 Diabetes Mellitus with microalbuminuria, without long-term current use of insulin HgbA1c is at goal. Last A1c was 6.0 CBGs: fasting 80- 115 Episodes of hypoglycemia: no Switch from Victoza to Mounjaro 7.5 mg last OV. Had mild bloating at first which has subsided. Also on metformin 1000 mg BID. Appetite and cravings better controlled with  Mounjaro.  Lab Results  Component Value Date   HGBA1C 6.0 08/18/2022   HGBA1C 6.2 (H) 03/18/2022   HGBA1C 6.4 (H) 11/12/2021   Lab Results  Component Value Date   LDLCALC 53 03/18/2022   CREATININE 0.73 03/18/2022   No results found for: "GFR"  Plan: Continue Mounjaro 7.5 mg weekly and metformin 1000 mg twice daily. Reduce simple carbohydrates.   2. Hypertension associated with type 2 diabetes Hypertension reasonably well controlled.  Home BPs-systolic < 120, diastolic- 70s Medication(s): Amlodipine 5 mg daily, valsartan-HCTZ 320-25 mg daily.  BP Readings from Last 3 Encounters:  12/08/22 114/71  10/20/22 (!) 141/85  09/15/22 132/70   Lab Results  Component Value Date   CREATININE 0.73 03/18/2022   CREATININE 0.65 11/12/2021   CREATININE 0.73 06/27/2021   No results found for: "GFR"  Plan: Continue all antihypertensives at current dosages.   3. Morbid Obesity: Current BMI 38  Cheral is currently in the action stage of change. As such, her goal is to continue with weight loss efforts.  She has agreed to the Category 2 plan and keeping a food journal with goal of 1000-1100 calories and 80 grams of protein daily.  1.  Do not keep sweets in the house.  Exercise goals:  as is  Behavioral modification strategies: decreasing simple carbohydrates , better snacking choices, and planning for success.  Christye has agreed to follow-up with our clinic in 2 weeks.   No orders of the defined types were placed in this encounter.   Medications Discontinued During This Encounter  Medication Reason   BD  PEN NEEDLE NANO 2ND GEN 32G X 4 MM MISC      No orders of the defined types were placed in this encounter.     Objective:   VITALS: Per patient if applicable, see vitals. GENERAL: Alert and in no acute distress. CARDIOPULMONARY: No increased WOB. Speaking in clear sentences.  PSYCH: Pleasant and cooperative. Speech normal rate and rhythm. Affect is appropriate.  Insight and judgement are appropriate. Attention is focused, linear, and appropriate.  NEURO: Oriented as arrived to appointment on time with no prompting.   Attestation Statements:   Reviewed by clinician on day of visit: allergies, medications, problem list, medical history, surgical history, family history, social history, and previous encounter notes.  Time spent on visit including the items listed below was 30 minutes.  -preparing to see the patient (e.g., review of tests, history, previous notes) -obtaining and/or reviewing separately obtained history -counseling and educating the patient/family/caregiver -documenting clinical information in the electronic or other health record -ordering medications, tests, or procedures -independently interpreting results and communicating results to the patient/ family/caregiver -referring and communicating with other health care professionals  -care coordination   This was prepared with the assistance of Engineer, civil (consulting).  Occasional wrong-word or sound-a-like substitutions may have occurred due to the inherent limitations of voice recognition software.

## 2022-12-22 ENCOUNTER — Telehealth (INDEPENDENT_AMBULATORY_CARE_PROVIDER_SITE_OTHER): Payer: BC Managed Care – PPO | Admitting: Family Medicine

## 2022-12-22 ENCOUNTER — Encounter (INDEPENDENT_AMBULATORY_CARE_PROVIDER_SITE_OTHER): Payer: Self-pay | Admitting: Family Medicine

## 2022-12-22 DIAGNOSIS — I152 Hypertension secondary to endocrine disorders: Secondary | ICD-10-CM

## 2022-12-22 DIAGNOSIS — R809 Proteinuria, unspecified: Secondary | ICD-10-CM

## 2022-12-22 DIAGNOSIS — Z6838 Body mass index (BMI) 38.0-38.9, adult: Secondary | ICD-10-CM

## 2022-12-22 DIAGNOSIS — E1129 Type 2 diabetes mellitus with other diabetic kidney complication: Secondary | ICD-10-CM

## 2022-12-22 DIAGNOSIS — E1159 Type 2 diabetes mellitus with other circulatory complications: Secondary | ICD-10-CM

## 2022-12-22 DIAGNOSIS — Z7984 Long term (current) use of oral hypoglycemic drugs: Secondary | ICD-10-CM

## 2023-01-03 ENCOUNTER — Other Ambulatory Visit (INDEPENDENT_AMBULATORY_CARE_PROVIDER_SITE_OTHER): Payer: Self-pay | Admitting: Family Medicine

## 2023-01-07 ENCOUNTER — Ambulatory Visit (INDEPENDENT_AMBULATORY_CARE_PROVIDER_SITE_OTHER): Payer: BC Managed Care – PPO | Admitting: Family Medicine

## 2023-01-07 ENCOUNTER — Encounter (INDEPENDENT_AMBULATORY_CARE_PROVIDER_SITE_OTHER): Payer: Self-pay | Admitting: Family Medicine

## 2023-01-07 VITALS — BP 116/71 | HR 78 | Temp 97.8°F | Ht 66.0 in | Wt 240.0 lb

## 2023-01-07 DIAGNOSIS — Z7984 Long term (current) use of oral hypoglycemic drugs: Secondary | ICD-10-CM

## 2023-01-07 DIAGNOSIS — E1169 Type 2 diabetes mellitus with other specified complication: Secondary | ICD-10-CM

## 2023-01-07 DIAGNOSIS — I152 Hypertension secondary to endocrine disorders: Secondary | ICD-10-CM | POA: Diagnosis not present

## 2023-01-07 DIAGNOSIS — Z6838 Body mass index (BMI) 38.0-38.9, adult: Secondary | ICD-10-CM

## 2023-01-07 DIAGNOSIS — E559 Vitamin D deficiency, unspecified: Secondary | ICD-10-CM

## 2023-01-07 DIAGNOSIS — E1159 Type 2 diabetes mellitus with other circulatory complications: Secondary | ICD-10-CM

## 2023-01-07 MED ORDER — TIRZEPATIDE 10 MG/0.5ML ~~LOC~~ SOAJ: 10.0000 mg | SUBCUTANEOUS | 0 refills | Status: DC

## 2023-01-07 NOTE — Progress Notes (Signed)
Terri [name], D.O.  ABFM, ABOM Specializing in Clinical Bariatric Medicine  Office located at: [address] [place], [place]  [address]     Assessment and Plan:   Orders Placed This Encounter  Procedures   Hemoglobin A1c    Medications Discontinued During This Encounter  Medication Reason   tirzepatide (MOUNJARO) 7.5 MG/0.5ML Pen Dose change     Meds ordered this encounter  Medications   tirzepatide (MOUNJARO) 10 MG/0.5ML Pen    Sig: Inject 10 mg into the skin once a week.    Dispense:  2 mL    Refill:  0     Hypertension associated with type 2 diabetes mellitus (HCC) Assessment: Condition is stable. Last 3 blood pressure readings in our office are as follows: BP Readings from Last 3 Encounters:  01/07/23 116/71  12/08/22 114/71  10/20/22 (!) 141/85  - Her blood pressure is stable today. No concerns. - She has been compliant with Norvasc 5 mg daily. Denies any adverse effects.  Plan: - Continue with antihypertensive medication as recommended by PCP.  - We will continue to monitor closely alongside PCP/ specialists.  Pt reminded to also f/up with those individuals as instructed by them.  - We will continue to monitor symptoms as they relate to the her weight loss journey.    Type 2 diabetes mellitus with obesity (HCC) Assessment: Condition is improving, but not optimized. Lab Results  Component Value Date   HGBA1C 6.0 08/18/2022   HGBA1C 6.2 (H) 03/18/2022   HGBA1C 6.4 (H) 11/12/2021  - She reports good compliance and tolerance with Mounjaro 7.5 mg once weekly and Metformin 1,000 mg BID. Denies any adverse effects. - Her hunger and cravings are well controlled when eating on plan. - Pt endorses that she has been eating off plan on a few occasions since last OV.   Plan: - Continue with Metformin. Increase Mounjaro to 10 mg once weekly.  - Continue her prudent nutritional plan that is low in simple carbohydrates, saturated fats and trans fats to  goal of 5-10% weight loss to achieve significant health benefits.  Pt encouraged to continually advance exercise and cardiovascular fitness as tolerated throughout weight loss journey.    Vitamin D deficiency Assessment: Condition is not quite at goal.  Lab Results  Component Value Date   VD25OH 48.3 03/18/2022   VD25OH 42.6 08/22/2021  - No issues with OTC Cholecalciferol 1,000 units daily. Denies any adverse effects.  Plan: - Continue with OTC supplement.  - Will continue to monitor levels regularly (every 3-4 mo on average) to keep levels within normal limits and prevent over supplementation.   TREATMENT PLAN FOR OBESITY: BMI 38.0-38.9,adult Morbid obesity (HCC)-start bmi 41.16/date 08/22/21 Assessment:  Terri [name] is here to discuss her progress with her obesity treatment plan along with follow-up of her obesity related diagnoses. See Medical Weight Management Flowsheet for complete bioelectrical impedance results.  Condition is not optimized. Biometric data collected today, was reviewed with patient.   Since last office visit on 12/08/22 patient's  Muscle mass has increased by 5.2 lb. Fat mass has decreased by 4.8 lb. Total body water has increased by 2.2 lb.  Counseling done on how various foods will affect these numbers and how to maximize success  Total lbs lost to date: -15  Total weight loss percentage to date: 5.88   Plan:  - Continue with the Category 2 meal and keeping a food journal of 1,000 - 1100 calories and 80+  grams of protein daily.  - Encouraged pt to always check the nutritional menu and select leaner meats when eating out.   - I also recommended patient to try her best to choose foods that have a 10:1 ratio of calories to protein.    Behavioral Intervention Additional resources provided today:  food journaling log.  Evidence-based interventions for health behavior change were utilized today including the discussion of self monitoring techniques,  problem-solving barriers and SMART goal setting techniques.   Regarding patient's less desirable eating habits and patterns, we employed the technique of small changes.  Pt will specifically work on: accurately journaling her intake  for next visit.    Recommended Physical Activity Goals  Terri [name] has been advised to slowly work up to 150 minutes of moderate intensity aerobic activity a week and strengthening exercises 2-3 times per week for cardiovascular health, weight loss maintenance and preservation of muscle mass.   She has agreed to Continue current level of physical activity    FOLLOW UP: Return in about 3 weeks (around 01/28/2023). She was informed of the importance of frequent follow up visits to maximize her success with intensive lifestyle modifications for her multiple health conditions.   Subjective:   Chief complaint: Obesity Terri [name] is here to discuss her progress with her obesity treatment plan. She is on the the Category 2 Plan and keeping a food journal and adhering to recommended goals of 1,000- 1,100 calories and 80+ protein and states she is following her eating plan approximately 80% of the time. She states she is walking 30 minutes 2 days per week.  Interval History:  Terri [name] is here for a follow up office visit.     Since last office visit:   - From journaling, she learned that she was over on calories on days that she went to eat out.  - She has been compliant with her Terri [name] and is tolerating it well.  - When eating on plan and meeting her calories and protein goals, her hunger is controlled.   Pharmacotherapy for weight loss: She is currently taking  Mounjaro and Metformin  for medical weight loss.  Denies side effects.    Review of Systems:  Pertinent positives were addressed with patient today.  Weight Summary and Biometrics   Weight Lost Since Last Visit: 0  Weight Gained Since Last Visit: 1 lb    Vitals Temp: 97.8 F (36.6 C) BP:  116/71 Pulse Rate: 78 SpO2: 97 %   Anthropometric Measurements Height: 5\' 6"  (1.676 m) Weight: 240 lb (108.9 kg) BMI (Calculated): 38.76 Weight at Last Visit: 239 lb Weight Lost Since Last Visit: 0 Weight Gained Since Last Visit: 1 lb Total Weight Loss (lbs): 20 lb (9.072 kg) Peak Weight: 260 lb   Body Composition  Body Fat %: 44.3 % Fat Mass (lbs): 106.4 lbs Muscle Mass (lbs): 126.8 lbs Total Body Water (lbs): 97.6 lbs Visceral Fat Rating : 13   Other Clinical Data Fasting: No Labs: No Today's Visit #: 23 Starting Date: 08/22/21   Objective:   PHYSICAL EXAM: Blood pressure 116/71, pulse 78, temperature 97.8 F (36.6 C), height 5\' 6"  (1.676 m), weight 240 lb (108.9 kg), SpO2 97 %. Body mass index is 38.74 kg/m.  General: Well Developed, well nourished, and in no acute distress.  HEENT: Normocephalic, atraumatic Skin: Warm and dry, cap RF less 2 sec, good turgor Chest:  Normal excursion, shape, no gross abn Respiratory: speaking in full sentences, no conversational dyspnea NeuroM-Sk: Ambulates  w/o assistance, moves * 4 Psych: A and O *3, insight good, mood-full  DIAGNOSTIC DATA REVIEWED:  BMET    Component Value Date/Time   NA 140 03/18/2022 0812   K 4.6 03/18/2022 0812   CL 100 03/18/2022 0812   CO2 25 03/18/2022 0812   GLUCOSE 99 03/18/2022 0812   GLUCOSE 125 (H) 11/28/2017 2010   BUN 16 03/18/2022 0812   CREATININE 0.73 03/18/2022 0812   CALCIUM 9.4 03/18/2022 0812   GFRNONAA >60 11/28/2017 2010   GFRAA >60 11/28/2017 2010   Lab Results  Component Value Date   HGBA1C 6.0 08/18/2022   HGBA1C 7.4 (H) 02/28/2021   No results found for: "INSULIN" Lab Results  Component Value Date   TSH 1.650 03/18/2022   CBC    Component Value Date/Time   WBC 4.8 03/18/2022 0812   WBC 6.6 11/28/2017 2010   RBC 3.98 03/18/2022 0812   RBC 4.31 11/28/2017 2010   HGB 11.7 03/18/2022 0812   HCT 35.0 03/18/2022 0812   PLT 209 03/18/2022 0812   MCV 88  03/18/2022 0812   MCH 29.4 03/18/2022 0812   MCH 29.0 11/28/2017 2010   MCHC 33.4 03/18/2022 0812   MCHC 30.9 11/28/2017 2010   RDW 12.4 03/18/2022 0812   Iron Studies No results found for: "IRON", "TIBC", "FERRITIN", "IRONPCTSAT" Lipid Panel     Component Value Date/Time   CHOL 129 03/18/2022 0812   TRIG 98 03/18/2022 0812   HDL 58 03/18/2022 0812   CHOLHDL 2.2 03/18/2022 0812   LDLCALC 53 03/18/2022 0812   Hepatic Function Panel     Component Value Date/Time   PROT 7.1 03/18/2022 0812   ALBUMIN 4.6 03/18/2022 0812   AST 12 03/18/2022 0812   ALT 11 03/18/2022 0812   ALKPHOS 47 03/18/2022 0812   BILITOT 0.3 03/18/2022 0812   BILIDIR 0.2 03/24/2016 0650   IBILI 0.5 03/24/2016 0650      Component Value Date/Time   TSH 1.650 03/18/2022 1610   Nutritional Lab Results  Component Value Date   VD25OH 48.3 03/18/2022   VD25OH 42.6 08/22/2021    Attestations:   Reviewed by clinician on day of visit: allergies, medications, problem list, medical history, surgical history, family history, social history, and previous encounter notes.  Patient was in the office today and time spent on visit including pre-visit chart review and post-visit care/coordination of care and electronic medical record documentation was 40 minutes. 50% of that time was in face to face counseling of this patient's medical condition(s) and providing education on treatment options to always include the first-line treatment of diet and lifestyle modification.    I,Special Puri,acting as a Neurosurgeon for [employer], DO.,have documented all relevant documentation on the behalf of Thomasene Lot, DO,as directed by  Thomasene Lot, DO while in the presence of Thomasene Lot, DO.   I, Thomasene Lot, DO, have reviewed all documentation for this visit. The documentation on 01/07/23 for the exam, diagnosis, procedures, and orders are all accurate and complete.

## 2023-01-12 ENCOUNTER — Telehealth (INDEPENDENT_AMBULATORY_CARE_PROVIDER_SITE_OTHER): Payer: Self-pay | Admitting: Family Medicine

## 2023-01-12 DIAGNOSIS — E1169 Type 2 diabetes mellitus with other specified complication: Secondary | ICD-10-CM

## 2023-01-12 NOTE — Telephone Encounter (Signed)
6.3 Patient states that her Pharmacy doesn't have to 10MG  OF Mounjaro but they have the 7mg  Mounjaro. Patient would like to know if she ccan either be swtich to the 7mg  or get advice on finding the 10mg  [facility]

## 2023-01-13 MED ORDER — TIRZEPATIDE 7.5 MG/0.5ML ~~LOC~~ SOAJ
7.5000 mg | SUBCUTANEOUS | 0 refills | Status: DC
Start: 2023-01-13 — End: 2023-01-22

## 2023-01-19 ENCOUNTER — Other Ambulatory Visit: Payer: Self-pay | Admitting: Internal Medicine

## 2023-01-19 DIAGNOSIS — Z794 Long term (current) use of insulin: Secondary | ICD-10-CM

## 2023-01-21 NOTE — Progress Notes (Signed)
TeleHealth Visit:  This visit was completed with telemedicine (audio/video) technology. Terri [name] has verbally consented to this TeleHealth visit. The patient is located at home, the provider is located at home. The participants in this visit include the listed provider and patient. The visit was conducted today via MyChart video.  OBESITY Terri [name] is here to discuss her progress with her obesity treatment plan along with follow-up of her obesity related diagnoses.   Today's visit was # 24 Starting weight: 255 lbs Starting date: 08/22/21 Weight at last in office visit: 240 lbs on 01/07/23 Total weight loss: 15 lbs at last in office visit on 01/07/23. Today's reported weight (01/22/23):  234 lbs  Nutrition Plan: keeping a food journal with goal of 1000-1100 calories and 80 grams of protein daily   Current exercise: walking 30 minutes 2 days per week   Interim History:  House is clean of junk food. She is eating on plan. Jounaling daily. She has become very aware of caloric intake especially when eating out. Calories average 1100-1200/day. Protein- gets a least 80 gms of protein daily but sometimes more. Hunger and cravings are satisfied. She sees her PCP tomorrow.  Plans on doing water exercises this summer.   Assessment/Plan:  1. Type 2 Diabetes Mellitus with microalbuminuria, with long-term current use of insulin HgbA1c is at goal. Last A1c was 6.0. CBGs: Running in low 100s. Episodes of hypoglycemia: no Medication(s): Mounjaro 7.5 mg SQ weekly, metformin at 1000 mg twice daily Appetite well-controlled.  Tolerating Mounjaro well. Lab Results  Component Value Date   HGBA1C 6.0 08/18/2022   HGBA1C 6.2 (H) 03/18/2022   HGBA1C 6.4 (H) 11/12/2021   Lab Results  Component Value Date   LDLCALC 53 03/18/2022   CREATININE 0.73 03/18/2022   No results found for: "GFR"  Plan: Continue and refill Mounjaro 7.5 mg SQ weekly   2. Hypertension associated with type 2  diabetes Hypertension borderline controlled.  Out of her last 3 blood pressure one was elevated at 141/85. Medication(s): Valsartan-HCTZ 320-25 mg daily, amlodipine 5 mg daily. Sees PCP tomorrow.  He has talked about adding another blood pressure medication but she prefers not to do this.  BP Readings from Last 3 Encounters:  01/07/23 116/71  12/08/22 114/71  10/20/22 (!) 141/85   Lab Results  Component Value Date   CREATININE 0.73 03/18/2022   CREATININE 0.65 11/12/2021   CREATININE 0.73 06/27/2021   No results found for: "GFR"  Plan: Continue all antihypertensives at current dosages. Follow-up with PCP for management.   3. Morbid Obesity: Current BMI 38  Terri [name] is currently in the action stage of change. As such, her goal is to continue with weight loss efforts.  She has agreed to keeping a food journal with goal of 1000-1100 calories and 80 grams of protein daily.  Exercise goals: Add in swimming at least once per week and continue walking twice weekly.  Behavioral modification strategies: increasing lean protein intake, decreasing simple carbohydrates , and keep a strict food journal.  Terri [name] has agreed to follow-up with our clinic in 4 weeks.  No orders of the defined types were placed in this encounter.   Medications Discontinued During This Encounter  Medication Reason   tirzepatide [facility]) 7.5 MG/0.5ML Pen Reorder     Meds ordered this encounter  Medications   tirzepatide (MOUNJARO) 7.5 MG/0.5ML Pen    Sig: Inject 7.5 mg into the skin once a week.    Dispense:  2 mL    Refill:  0  Order Specific Question:   Supervising Provider    Answer:   Glennis Brink [[id number]]      Objective:   VITALS: Per patient if applicable, see vitals. GENERAL: Alert and in no acute distress. CARDIOPULMONARY: No increased WOB. Speaking in clear sentences.  PSYCH: Pleasant and cooperative. Speech normal rate and rhythm. Affect is appropriate. Insight and judgement are  appropriate. Attention is focused, linear, and appropriate.  NEURO: Oriented as arrived to appointment on time with no prompting.   Attestation Statements:   Reviewed by clinician on day of visit: allergies, medications, problem list, medical history, surgical history, family history, social history, and previous encounter notes.   This was prepared with the assistance of Engineer, civil (consulting).  Occasional wrong-word or sound-a-like substitutions may have occurred due to the inherent limitations of voice recognition software.

## 2023-01-22 ENCOUNTER — Encounter (INDEPENDENT_AMBULATORY_CARE_PROVIDER_SITE_OTHER): Payer: Self-pay | Admitting: Family Medicine

## 2023-01-22 ENCOUNTER — Telehealth (INDEPENDENT_AMBULATORY_CARE_PROVIDER_SITE_OTHER): Payer: BC Managed Care – PPO | Admitting: Family Medicine

## 2023-01-22 DIAGNOSIS — E1159 Type 2 diabetes mellitus with other circulatory complications: Secondary | ICD-10-CM | POA: Diagnosis not present

## 2023-01-22 DIAGNOSIS — I152 Hypertension secondary to endocrine disorders: Secondary | ICD-10-CM

## 2023-01-22 DIAGNOSIS — R809 Proteinuria, unspecified: Secondary | ICD-10-CM

## 2023-01-22 DIAGNOSIS — Z6838 Body mass index (BMI) 38.0-38.9, adult: Secondary | ICD-10-CM

## 2023-01-22 DIAGNOSIS — E1129 Type 2 diabetes mellitus with other diabetic kidney complication: Secondary | ICD-10-CM | POA: Diagnosis not present

## 2023-01-22 MED ORDER — TIRZEPATIDE 7.5 MG/0.5ML ~~LOC~~ SOAJ
7.5000 mg | SUBCUTANEOUS | 0 refills | Status: DC
Start: 2023-01-22 — End: 2023-02-16

## 2023-01-23 ENCOUNTER — Ambulatory Visit (INDEPENDENT_AMBULATORY_CARE_PROVIDER_SITE_OTHER): Payer: BC Managed Care – PPO | Admitting: Internal Medicine

## 2023-01-23 ENCOUNTER — Encounter: Payer: Self-pay | Admitting: Internal Medicine

## 2023-01-23 VITALS — BP 123/74 | HR 107 | Ht 66.0 in | Wt 239.4 lb

## 2023-01-23 DIAGNOSIS — I10 Essential (primary) hypertension: Secondary | ICD-10-CM | POA: Diagnosis not present

## 2023-01-23 DIAGNOSIS — R21 Rash and other nonspecific skin eruption: Secondary | ICD-10-CM | POA: Diagnosis not present

## 2023-01-23 DIAGNOSIS — E1159 Type 2 diabetes mellitus with other circulatory complications: Secondary | ICD-10-CM | POA: Diagnosis not present

## 2023-01-23 DIAGNOSIS — I152 Hypertension secondary to endocrine disorders: Secondary | ICD-10-CM

## 2023-01-23 DIAGNOSIS — E1169 Type 2 diabetes mellitus with other specified complication: Secondary | ICD-10-CM

## 2023-01-23 DIAGNOSIS — E669 Obesity, unspecified: Secondary | ICD-10-CM

## 2023-01-23 DIAGNOSIS — L989 Disorder of the skin and subcutaneous tissue, unspecified: Secondary | ICD-10-CM

## 2023-01-23 MED ORDER — TRIAMCINOLONE ACETONIDE 0.1 % EX CREA
TOPICAL_CREAM | Freq: Every day | CUTANEOUS | 0 refills | Status: DC | PRN
Start: 2023-01-23 — End: 2024-03-18

## 2023-01-23 MED ORDER — VALSARTAN-HYDROCHLOROTHIAZIDE 320-25 MG PO TABS
1.0000 | ORAL_TABLET | Freq: Every day | ORAL | 3 refills | Status: DC
Start: 2023-01-23 — End: 2023-12-14

## 2023-01-23 MED ORDER — AMLODIPINE BESYLATE 5 MG PO TABS
5.0000 mg | ORAL_TABLET | Freq: Every day | ORAL | 3 refills | Status: DC
Start: 2023-01-23 — End: 2023-03-24

## 2023-01-23 NOTE — Assessment & Plan Note (Signed)
There is a raised, erythematous, 2 x 0.5 cm lesion on the right side of her neck.  She states that this has been present for the last 2 weeks and has not improved despite application of hydrocortisone cream.  She states that she first noticed the lesion after holding a child at school. -Ketoconazole-triamcinolone cream prescribed today

## 2023-01-23 NOTE — Progress Notes (Signed)
Established Patient Office Visit  Subjective   Patient ID: Terri [name], female    DOB: 09-04-1965  Age: 57 y.o. MRN: [id number]  Chief Complaint  Patient presents with   Diabetes    Follow up   Terri [name] returns to care today for routine follow-up.  She was last evaluated by me on 2/5.  No medication changes were made at that time and 26-month follow-up was arranged.  In the interim she has been seen on multiple occasions by healthy weight and wellness.  There have otherwise been no acute interval events.  Terri [name] reports feeling well today.  She is asymptomatic and has no acute concerns to discuss.  Past Medical History:  Diagnosis Date   Arthritis    right hip and knee pain; had right ankle fusion   Back pain    Diabetes mellitus without complication (HCC)    Edema, lower extremity    Hip pain    Hyperlipidemia    Hypertension    Joint pain    Knee pain    Past Surgical History:  Procedure Laterality Date   ANKLE FUSION Right    APPENDECTOMY     CHOLECYSTECTOMY N/A 03/21/2016   Procedure: LAPAROSCOPIC CHOLECYSTECTOMY;  Surgeon: Franky Macho, MD;  Location: [facility];  Service: General;  Laterality: N/A;   TOTAL ABDOMINAL HYSTERECTOMY     Social History   Tobacco Use   Smoking status: Never   Smokeless tobacco: Never  Vaping Use   Vaping Use: Never used  Substance Use Topics   Alcohol use: No   Drug use: No   Family History  Problem Relation Age of Onset   Diabetes Mother    High blood pressure Mother    Stroke Mother    Obesity Mother    Sudden death Father    Allergies  Allergen Reactions   Penicillins     Develops yeast infections when on medication   Review of Systems  Skin:  Positive for rash (right side of neck).     Objective:     BP 123/74   Pulse (!) 107   Ht 5\' 6"  (1.676 m)   Wt 239 lb 6.4 oz (108.6 kg)   SpO2 91%   BMI 38.64 kg/m  BP Readings from Last 3 Encounters:  01/23/23 123/74  01/07/23 116/71  12/08/22 114/71    Physical Exam Vitals reviewed.  Constitutional:      General: She is not in acute distress.    Appearance: Normal appearance. She is obese. She is not toxic-appearing.  HENT:     Head: Normocephalic and atraumatic.     Right Ear: External ear normal.     Left Ear: External ear normal.     Nose: Nose normal. No congestion or rhinorrhea.     Mouth/Throat:     Mouth: Mucous membranes are moist.     Pharynx: Oropharynx is clear. No oropharyngeal exudate or posterior oropharyngeal erythema.  Eyes:     General: No scleral icterus.    Conjunctiva/sclera: Conjunctivae normal.     Pupils: Pupils are equal, round, and reactive to light.  Cardiovascular:     Rate and Rhythm: Normal rate and regular rhythm.  Pulmonary:     Effort: Pulmonary effort is normal.     Breath sounds: Normal breath sounds. No wheezing, rhonchi or rales.  Abdominal:     General: Abdomen is flat. Bowel sounds are normal. There is no distension.     Palpations: Abdomen is soft.  Tenderness: There is no abdominal tenderness.  Musculoskeletal:        General: Normal range of motion.     Cervical back: Normal range of motion.  Lymphadenopathy:     Cervical: No cervical adenopathy.  Skin:    General: Skin is warm and dry.     Capillary Refill: Capillary refill takes less than 2 seconds.     Coloration: Skin is not jaundiced.     Findings: Lesion (raised, erythematous 2 x 0.5 cm lesion on right side of neck) present.  Neurological:     General: No focal deficit present.     Mental Status: She is alert and oriented to person, place, and time.  Psychiatric:        Mood and Affect: Mood normal.        Behavior: Behavior normal.        Thought Content: Thought content normal.    Diabetic foot exam was performed.  No deformities or other abnormal visual findings.  Posterior tibialis and dorsalis pulse intact bilaterally.  Intact to touch and monofilament testing bilaterally.   Last CBC Lab Results   Component Value Date   WBC 4.8 03/18/2022   HGB 11.7 03/18/2022   HCT 35.0 03/18/2022   MCV 88 03/18/2022   MCH 29.4 03/18/2022   RDW 12.4 03/18/2022   PLT 209 03/18/2022   Last metabolic panel Lab Results  Component Value Date   GLUCOSE 99 03/18/2022   NA 140 03/18/2022   K 4.6 03/18/2022   CL 100 03/18/2022   CO2 25 03/18/2022   BUN 16 03/18/2022   CREATININE 0.73 03/18/2022   EGFR 97 03/18/2022   CALCIUM 9.4 03/18/2022   PHOS 2.3 (L) 03/22/2016   PROT 7.1 03/18/2022   ALBUMIN 4.6 03/18/2022   LABGLOB 2.5 03/18/2022   AGRATIO 1.8 03/18/2022   BILITOT 0.3 03/18/2022   ALKPHOS 47 03/18/2022   AST 12 03/18/2022   ALT 11 03/18/2022   ANIONGAP 11 11/28/2017   Last lipids Lab Results  Component Value Date   CHOL 129 03/18/2022   HDL 58 03/18/2022   LDLCALC 53 03/18/2022   TRIG 98 03/18/2022   CHOLHDL 2.2 03/18/2022   Last hemoglobin A1c Lab Results  Component Value Date   HGBA1C 6.0 08/18/2022   Last thyroid functions Lab Results  Component Value Date   TSH 1.650 03/18/2022   Last vitamin D Lab Results  Component Value Date   VD25OH 48.3 03/18/2022   Last vitamin B12 and Folate Lab Results  Component Value Date   VITAMINB12 378 08/22/2021     Assessment & Plan:   Problem List Items Addressed This Visit       Hypertension associated with type 2 diabetes mellitus (HCC)    BP remains well-controlled on current antihypertensive regimen of amlodipine 5 mg daily and valsartan-HCTZ 320-25 mg daily.  No medication changes are indicated today.      Type 2 diabetes mellitus with obesity (HCC)    A1c 6.0 when updated in January.  She is currently prescribed metformin 1000 mg twice daily and Mounjaro 7.5 mg weekly. -No medication changes today -Diabetic foot exam completed -Repeat A1c at follow-up in 3 months      Skin lesion of neck    There is a raised, erythematous, 2 x 0.5 cm lesion on the right side of her neck.  She states that this has been  present for the last 2 weeks and has not improved despite application of hydrocortisone cream.  She states that she first noticed the lesion after holding a child at school. -Ketoconazole-triamcinolone cream prescribed today      Return in about 3 months (around 04/25/2023) for CPE.   Terri Lade, MD

## 2023-01-23 NOTE — Patient Instructions (Signed)
It was a pleasure to see you today.  Thank you for giving [place] the opportunity to be involved in your care.  Below is a brief recap of your visit and next steps.  We will plan to see you again in 3 months.  Summary No medication changes today I have prescribed ketoconazole-triamcinolone cream for the rash on your neck Follow up in 3 months

## 2023-01-23 NOTE — Assessment & Plan Note (Signed)
BP remains well-controlled on current antihypertensive regimen of amlodipine 5 mg daily and valsartan-HCTZ 320-25 mg daily.  No medication changes are indicated today.

## 2023-01-23 NOTE — Assessment & Plan Note (Signed)
A1c 6.0 when updated in January.  She is currently prescribed metformin 1000 mg twice daily and Mounjaro 7.5 mg weekly. -No medication changes today -Diabetic foot exam completed -Repeat A1c at follow-up in 3 months

## 2023-02-09 ENCOUNTER — Other Ambulatory Visit: Payer: Self-pay | Admitting: Internal Medicine

## 2023-02-09 DIAGNOSIS — Z794 Long term (current) use of insulin: Secondary | ICD-10-CM

## 2023-02-10 ENCOUNTER — Ambulatory Visit (INDEPENDENT_AMBULATORY_CARE_PROVIDER_SITE_OTHER): Payer: BC Managed Care – PPO

## 2023-02-10 DIAGNOSIS — Z23 Encounter for immunization: Secondary | ICD-10-CM | POA: Diagnosis not present

## 2023-02-10 LAB — HEMOGLOBIN A1C
Est. average glucose Bld gHb Est-mCnc: 131 mg/dL
Hgb A1c MFr Bld: 6.2 % — ABNORMAL HIGH (ref 4.8–5.6)

## 2023-02-10 LAB — VITAMIN D 25 HYDROXY (VIT D DEFICIENCY, FRACTURES): Vit D, 25-Hydroxy: 34.3 ng/mL (ref 30.0–100.0)

## 2023-02-16 ENCOUNTER — Encounter (INDEPENDENT_AMBULATORY_CARE_PROVIDER_SITE_OTHER): Payer: Self-pay | Admitting: Family Medicine

## 2023-02-16 ENCOUNTER — Ambulatory Visit (INDEPENDENT_AMBULATORY_CARE_PROVIDER_SITE_OTHER): Payer: BC Managed Care – PPO | Admitting: Family Medicine

## 2023-02-16 ENCOUNTER — Other Ambulatory Visit: Payer: Self-pay

## 2023-02-16 ENCOUNTER — Telehealth: Payer: Self-pay | Admitting: Internal Medicine

## 2023-02-16 VITALS — BP 126/84 | HR 85 | Temp 98.3°F | Ht 66.0 in | Wt 238.0 lb

## 2023-02-16 DIAGNOSIS — E1129 Type 2 diabetes mellitus with other diabetic kidney complication: Secondary | ICD-10-CM

## 2023-02-16 DIAGNOSIS — E559 Vitamin D deficiency, unspecified: Secondary | ICD-10-CM

## 2023-02-16 DIAGNOSIS — R809 Proteinuria, unspecified: Secondary | ICD-10-CM

## 2023-02-16 DIAGNOSIS — Z7984 Long term (current) use of oral hypoglycemic drugs: Secondary | ICD-10-CM

## 2023-02-16 DIAGNOSIS — E1169 Type 2 diabetes mellitus with other specified complication: Secondary | ICD-10-CM

## 2023-02-16 DIAGNOSIS — Z7985 Long-term (current) use of injectable non-insulin antidiabetic drugs: Secondary | ICD-10-CM

## 2023-02-16 DIAGNOSIS — Z6838 Body mass index (BMI) 38.0-38.9, adult: Secondary | ICD-10-CM

## 2023-02-16 MED ORDER — TIRZEPATIDE 7.5 MG/0.5ML ~~LOC~~ SOAJ: 7.5000 mg | SUBCUTANEOUS | 0 refills | Status: DC

## 2023-02-16 MED ORDER — METFORMIN HCL 1000 MG PO TABS
1000.0000 mg | ORAL_TABLET | Freq: Two times a day (BID) | ORAL | 3 refills | Status: DC
Start: 2023-02-16 — End: 2023-02-17

## 2023-02-16 MED ORDER — VITAMIN D (ERGOCALCIFEROL) 1.25 MG (50000 UNIT) PO CAPS
ORAL_CAPSULE | ORAL | 0 refills | Status: DC
Start: 2023-02-16 — End: 2023-05-21

## 2023-02-16 NOTE — Telephone Encounter (Signed)
Patient came by the office need refill Metformin HCL 500 mg tablet  [facility]

## 2023-02-16 NOTE — Progress Notes (Signed)
[name], D.O.  ABFM, ABOM Specializing in Clinical Bariatric Medicine  Office located at: [address] [place], [place]  [address]     Assessment and Plan:   Medications Discontinued During This Encounter  Medication Reason   Cholecalciferol (VITAMIN D3) 25 MCG (1000 UT) CAPS    tirzepatide (MOUNJARO) 7.5 MG/0.5ML Pen Reorder     Meds ordered this encounter  Medications   tirzepatide (MOUNJARO) 7.5 MG/0.5ML Pen    Sig: Inject 7.5 mg into the skin once a week.    Dispense:  2 mL    Refill:  0   Vitamin D, Ergocalciferol, (DRISDOL) 1.25 MG (50000 UNIT) CAPS capsule    Sig: 1 po q 21 days    Dispense:  6 capsule    Refill:  0     Vitamin D deficiency Assessment: Condition is worsening. Labs were reviewed with pt today and education provided on them . All questions were answered about them.    Labs below indicate that: Her Vitamin D levels have worsened from 48.3 on 03/18/2022 to 34.3. She endorses sporadically taking her OTC Cholecalciferol 1,000 units daily.   Lab Results  Component Value Date   VD25OH 34.3 02/09/2023   VD25OH 48.3 03/18/2022   VD25OH 42.6 08/22/2021   Plan: Start Ergocalciferol 50K IU every 21 days.  Weight loss will likely improve availability of vitamin D, thus encouraged Emmry to continue with meal plan and their weight loss efforts to further improve this condition.  Thus, we will need to monitor levels regularly (every 3-4 mo on average) to keep levels within normal limits and prevent over supplementation.   Type 2 diabetes mellitus with microalbuminuria, without long-term current use of insulin (HCC) Assessment: Condition is worsening. Labs were reviewed with pt today and education provided on them and how the foods patient eats may influence these findings. All questions were answered about them.    Labs below indicate that: Her A1c levels have worsened from 6.0 on 08/18/22 to 6.2. Her diabetes mellitus is being treated with  Mounjaro 7.5 mg SQ weekly and Metformin 1000 mg twice daily. She is tolerating both medications well and denies any GI upset. Her fasting blood sugars have been stable (92-120) at home. Her hunger and cravings are pretty well controlled.  Lab Results  Component Value Date   HGBA1C 6.2 (H) 02/09/2023   HGBA1C 6.0 08/18/2022   HGBA1C 6.2 (H) 03/18/2022   Plan: Continue with Mounjaro and Metformin at current dose.  Pt denies need for dose change in Mounjaro. Will refill Mounjaro today.    Intensive lifestyle modification including diet, exercise and weight loss are the first line of treatment for diabetes. I again discussed the importance of decreasing simple carbs, increasing proteins and how certain foods they eat will affect their blood sugars. Recheck labs in 3 months if not done at Endo provider / PCP. Importance of f/up with PCP and all other specialists, as scheduled, was stressed to the patient today     TREATMENT PLAN FOR OBESITY: BMI 38.0-38.9,adult- current BMI 38.43 Morbid obesity (HCC)-start bmi 41.16/date 08/22/21 Assessment: LAURNA PLAZA is here to discuss her progress with her obesity treatment plan along with follow-up of her obesity related diagnoses. See Medical Weight Management Flowsheet for complete bioelectrical impedance results.  Condition is not optimized. Biometric data collected today, was reviewed with patient.   Since last office visit on 01/07/23 patient's  Muscle mass has decreased by 3.4 lb. Fat mass has increased  by 1.6 lb. Total body water has decreased by 5.8 lb.  Counseling done on how various foods will affect these numbers and how to maximize success  Total lbs lost to date: 17  Total weight loss percentage to date: 6.67  Plan: Continue keeping a food journal and adhering to recommended goals of 1000-1100 calories and 80+ protein  - I briefly showed Alainna ways to increase her protein intake to obtain her goals of 80+ grams more consistently.   -  I also recommended pt to try  PB2 powdered peanut butter, which is a  healthier alternative to regular peanut butter.   - I discussed with pt that 300-450 minutes of exercise per week is recommended for the most optimal weight loss results.  Behavioral Intervention Additional resources provided today: patient declined Evidence-based interventions for health behavior change were utilized today including the discussion of self monitoring techniques, problem-solving barriers and SMART goal setting techniques.   Regarding patient's less desirable eating habits and patterns, we employed the technique of small changes.  Pt will specifically work on: continuing to increase caloric/protein intake and doing some form of exercise (cardio or weightlifting) 60 minutes, 3-5 days a wk along with water aerobics regiment for next visit.    Recommended Physical Activity Goals  [name] has been advised to slowly work up to 150 minutes of moderate intensity aerobic activity a week and strengthening exercises 2-3 times per week for cardiovascular health, weight loss maintenance and preservation of muscle mass.   She has agreed to Think about ways to increase daily physical activity and overcoming barriers to exercise  FOLLOW UP: Return in about 4 weeks (around 03/16/2023). She was informed of the importance of frequent follow up visits to maximize her success with intensive lifestyle modifications for her multiple health conditions.  Subjective:   Chief complaint: Obesity Haruna is here to discuss her progress with her obesity treatment plan. She is keeping a food journal and adhering to recommended goals of 1000-1100 calories and 80+ protein and states she is following her eating plan approximately 85% of the time. She states she is doing water aerobics 30 minutes 3 days per week.  Interval History:  ADALEA ZBOROWSKI is here for a follow up office visit. Since last OV, Hazely has been doing well. Her  journaling log indicates that she is hitting her protein goal 50% of the time and hitting her calorie goals roughly 40% of the time. When eating on plan, her hunger and cravings are well controlled.  Pharmacotherapy for weight loss: She is currently taking  Mounjaro and Metformin  for medical weight loss.  Denies side effects.    Review of Systems:  Pertinent positives were addressed with patient today.  Reviewed by clinician on day of visit: allergies, medications, problem list, medical history, surgical history, family history, social history, and previous encounter notes.  Weight Summary and Biometrics   Weight Lost Since Last Visit: 2lb  Weight Gained Since Last Visit: 0lb    Vitals Temp: 98.3 F (36.8 C) BP: 126/84 Pulse Rate: 85 SpO2: 97 %   Anthropometric Measurements Height: 5\' 6"  (1.676 m) Weight: 238 lb (108 kg) BMI (Calculated): 38.43 Weight at Last Visit: 240lb Weight Lost Since Last Visit: 2lb Weight Gained Since Last Visit: 0lb Starting Weight: 255lb Total Weight Loss (lbs): 22 lb (9.979 kg) Peak Weight: 260lb   Body Composition  Body Fat %: 45.4 % Fat Mass (lbs): 108 lbs Muscle Mass (lbs): 123.4 lbs Total Body Water (lbs):  91.8 lbs Visceral Fat Rating : 14   Other Clinical Data Fasting: no Labs: no Today's Visit #: 24 Starting Date: 08/22/21   Objective:   PHYSICAL EXAM: Blood pressure 126/84, pulse 85, temperature 98.3 F (36.8 C), height 5\' 6"  (1.676 m), weight 238 lb (108 kg), SpO2 97 %. Body mass index is 38.41 kg/m.  General: Well Developed, well nourished, and in no acute distress.  HEENT: Normocephalic, atraumatic Skin: Warm and dry, cap RF less 2 sec, good turgor Chest:  Normal excursion, shape, no gross abn Respiratory: speaking in full sentences, no conversational dyspnea NeuroM-Sk: Ambulates w/o assistance, moves * 4 Psych: A and O *3, insight good, mood-full  DIAGNOSTIC DATA REVIEWED:  BMET    Component Value Date/Time    NA 140 03/18/2022 0812   K 4.6 03/18/2022 0812   CL 100 03/18/2022 0812   CO2 25 03/18/2022 0812   GLUCOSE 99 03/18/2022 0812   GLUCOSE 125 (H) 11/28/2017 2010   BUN 16 03/18/2022 0812   CREATININE 0.73 03/18/2022 0812   CALCIUM 9.4 03/18/2022 0812   GFRNONAA >60 11/28/2017 2010   GFRAA >60 11/28/2017 2010   Lab Results  Component Value Date   HGBA1C 6.2 (H) 02/09/2023   HGBA1C 7.4 (H) 02/28/2021   No results found for: "INSULIN" Lab Results  Component Value Date   TSH 1.650 03/18/2022   CBC    Component Value Date/Time   WBC 4.8 03/18/2022 0812   WBC 6.6 11/28/2017 2010   RBC 3.98 03/18/2022 0812   RBC 4.31 11/28/2017 2010   HGB 11.7 03/18/2022 0812   HCT 35.0 03/18/2022 0812   PLT 209 03/18/2022 0812   MCV 88 03/18/2022 0812   MCH 29.4 03/18/2022 0812   MCH 29.0 11/28/2017 2010   MCHC 33.4 03/18/2022 0812   MCHC 30.9 11/28/2017 2010   RDW 12.4 03/18/2022 0812   Iron Studies No results found for: "IRON", "TIBC", "FERRITIN", "IRONPCTSAT" Lipid Panel     Component Value Date/Time   CHOL 129 03/18/2022 0812   TRIG 98 03/18/2022 0812   HDL 58 03/18/2022 0812   CHOLHDL 2.2 03/18/2022 0812   LDLCALC 53 03/18/2022 0812   Hepatic Function Panel     Component Value Date/Time   PROT 7.1 03/18/2022 0812   ALBUMIN 4.6 03/18/2022 0812   AST 12 03/18/2022 0812   ALT 11 03/18/2022 0812   ALKPHOS 47 03/18/2022 0812   BILITOT 0.3 03/18/2022 0812   BILIDIR 0.2 03/24/2016 0650   IBILI 0.5 03/24/2016 0650      Component Value Date/Time   TSH 1.650 03/18/2022 0812   Nutritional Lab Results  Component Value Date   VD25OH 34.3 02/09/2023   VD25OH 48.3 03/18/2022   VD25OH 42.6 08/22/2021    Attestations:   Patient was in the office today and time spent on visit including pre-visit chart review and post-visit care/coordination of care and electronic medical record documentation was 40 minutes. 50% of the time was in face to face counseling of this patient's  medical condition(s) and providing education on treatment options to include the first-line treatment of diet and lifestyle modification.   I, Special Randolm Idol, acting as a Stage manager for [employer], DO., have compiled all relevant documentation for today's office visit on behalf of Thomasene Lot, DO, while in the presence of [employer], DO.  I have reviewed the above documentation for accuracy and completeness, and I agree with the above. [name], D.O.  The 21st Century Cures Act  was signed into law in 2016 which includes the topic of electronic health records.  This provides immediate access to information in MyChart.  This includes consultation notes, operative notes, office notes, lab results and pathology reports.  If you have any questions about what you read please let us know at your next visit so we can discuss your concerns and take corrective action if need be.  We are right here with you.

## 2023-02-16 NOTE — Telephone Encounter (Signed)
Refills sent

## 2023-02-17 ENCOUNTER — Other Ambulatory Visit: Payer: Self-pay

## 2023-02-17 ENCOUNTER — Telehealth: Payer: Self-pay | Admitting: Internal Medicine

## 2023-02-17 ENCOUNTER — Other Ambulatory Visit: Payer: Self-pay | Admitting: Internal Medicine

## 2023-02-17 DIAGNOSIS — Z794 Long term (current) use of insulin: Secondary | ICD-10-CM

## 2023-02-17 DIAGNOSIS — E669 Obesity, unspecified: Secondary | ICD-10-CM

## 2023-02-17 MED ORDER — METFORMIN HCL 500 MG PO TABS
500.0000 mg | ORAL_TABLET | Freq: Two times a day (BID) | ORAL | 3 refills | Status: DC
Start: 2023-02-17 — End: 2024-02-01

## 2023-02-17 NOTE — Telephone Encounter (Signed)
Pt came by the office in regard to metFORMIN (GLUCOPHAGE) 1000 MG tablet [    Patient needs med in 500mg  2x daily  instead of 1000 2 times daily   Wants a  cll back in regard

## 2023-02-17 NOTE — Telephone Encounter (Signed)
Refill sent.

## 2023-02-23 ENCOUNTER — Other Ambulatory Visit (INDEPENDENT_AMBULATORY_CARE_PROVIDER_SITE_OTHER): Payer: Self-pay | Admitting: Family Medicine

## 2023-02-23 DIAGNOSIS — E1129 Type 2 diabetes mellitus with other diabetic kidney complication: Secondary | ICD-10-CM

## 2023-03-15 NOTE — Progress Notes (Signed)
TeleHealth Visit:  This visit was completed with telemedicine (audio/video) technology. Terri [name] has verbally consented to this TeleHealth visit. The patient is located at home, the provider is located at home. The participants in this visit include the listed provider and patient. The visit was conducted today via MyChart video.  OBESITY Terri [name] is here to discuss her progress with her obesity treatment plan along with follow-up of her obesity related diagnoses.   Today's visit was # 25 Starting weight: 255 lbs Starting date: 08/22/21 Weight at last in office visit: 238 lbs on 02/16/23 Total weight loss: 17 lbs at last in office visit on 02/16/23. Today's reported weight (03/16/23): none reported  Nutrition Plan: keeping a food journal with goal of 1000-1100 calories and 80+ grams of protein daily   Current exercise:  water aerobics 2 days for 60 minutes treadmill with incline 6.4 speed 2.4 mph and exercise bike 35 each 3 one day per week.   Interim History:  After discussion at her last visit with Dr. Sharee Holster, she increased her exercise starting July 9.  She is doing water aerobics for 60 minutes twice weekly and walking on the treadmill for 35 minutes and riding exercise bike 35 minutes once weekly. She has noticed less hip and knee pain with increased exercise. She has done an excellent job with journaling and meeting protein and calorie goals.  She averages around 1013 cal/day.  Protein ranges from 80 to 120 g daily. Hunger is satisfied. Water intake is very good.  Protein intake is as prescribed, Is not skipping meals, Journaling consistently., Meeting calorie goals., Denies polyphagia, and Denies excessive cravings.  Assessment/Plan:  1. Type 2 Diabetes Mellitus with microalbuminuria, with long-term current use of insulin HgbA1c is at goal. Last A1c was 6.2 CBGs: Fasting low 100s.  Episodes of hypoglycemia: no Medication(s): Mounjaro 7.5 mg SQ weekly.  Notes increased  flatus with the Mounjaro. She feels this dose is controlling her appetite well.  Lab Results  Component Value Date   HGBA1C 6.2 (H) 02/09/2023   HGBA1C 6.0 08/18/2022   HGBA1C 6.2 (H) 03/18/2022   Lab Results  Component Value Date   LDLCALC 53 03/18/2022   CREATININE 0.73 03/18/2022   No results found for: "GFR"  Plan: Continue and refill Mounjaro 7.5 mg SQ weekly   2. Vitamin D Deficiency Vitamin D is not at goal of 50.  Most recent vitamin D level was 34, down from 48.  She had been taken over-the-counter vitamin D 1000 IU and her dose was increased to 50,000 IU every 21 day last office visit.  Lab Results  Component Value Date   VD25OH 34.3 02/09/2023   VD25OH 48.3 03/18/2022   VD25OH 42.6 08/22/2021    Plan: Continue prescription vitamin D 50,000 IU every 21 days.   3. Morbid Obesity: Current BMI 38  Terri [name] is currently in the action stage of change. As such, her goal is to continue with weight loss efforts.  She has agreed to keeping a food journal with goal of 1000-1100 calories and 80 grams of protein daily.  Exercise goals:  as is  Behavioral modification strategies: planning for success and keep a strict food journal.  Terri [name] has agreed to follow-up with our clinic in 3 weeks.  No orders of the defined types were placed in this encounter.   Medications Discontinued During This Encounter  Medication Reason   tirzepatide (MOUNJARO) 7.5 MG/0.5ML Pen Reorder     Meds ordered this encounter  Medications   tirzepatide (  MOUNJARO) 7.5 MG/0.5ML Pen    Sig: Inject 7.5 mg into the skin once a week.    Dispense:  2 mL    Refill:  0    Order Specific Question:   Supervising Provider    Answer:   Glennis Brink [[id number]]      Objective:   VITALS: Per patient if applicable, see vitals. GENERAL: Alert and in no acute distress. CARDIOPULMONARY: No increased WOB. Speaking in clear sentences.  PSYCH: Pleasant and cooperative. Speech normal rate and rhythm.  Affect is appropriate. Insight and judgement are appropriate. Attention is focused, linear, and appropriate.  NEURO: Oriented as arrived to appointment on time with no prompting.   Attestation Statements:   Reviewed by clinician on day of visit: allergies, medications, problem list, medical history, surgical history, family history, social history, and previous encounter notes.   This was prepared with the assistance of Engineer, civil (consulting).  Occasional wrong-word or sound-a-like substitutions may have occurred due to the inherent limitations of voice recognition software.

## 2023-03-16 ENCOUNTER — Encounter (INDEPENDENT_AMBULATORY_CARE_PROVIDER_SITE_OTHER): Payer: Self-pay | Admitting: Family Medicine

## 2023-03-16 ENCOUNTER — Telehealth (INDEPENDENT_AMBULATORY_CARE_PROVIDER_SITE_OTHER): Payer: BC Managed Care – PPO | Admitting: Family Medicine

## 2023-03-16 DIAGNOSIS — E559 Vitamin D deficiency, unspecified: Secondary | ICD-10-CM | POA: Diagnosis not present

## 2023-03-16 DIAGNOSIS — E1129 Type 2 diabetes mellitus with other diabetic kidney complication: Secondary | ICD-10-CM

## 2023-03-16 DIAGNOSIS — R809 Proteinuria, unspecified: Secondary | ICD-10-CM | POA: Diagnosis not present

## 2023-03-16 DIAGNOSIS — Z6838 Body mass index (BMI) 38.0-38.9, adult: Secondary | ICD-10-CM

## 2023-03-16 DIAGNOSIS — Z7985 Long-term (current) use of injectable non-insulin antidiabetic drugs: Secondary | ICD-10-CM

## 2023-03-16 MED ORDER — TIRZEPATIDE 7.5 MG/0.5ML ~~LOC~~ SOAJ
7.5000 mg | SUBCUTANEOUS | 0 refills | Status: DC
Start: 2023-03-16 — End: 2023-04-09

## 2023-03-24 ENCOUNTER — Other Ambulatory Visit: Payer: Self-pay | Admitting: Nurse Practitioner

## 2023-03-24 DIAGNOSIS — I10 Essential (primary) hypertension: Secondary | ICD-10-CM

## 2023-03-30 ENCOUNTER — Other Ambulatory Visit (HOSPITAL_COMMUNITY): Payer: Self-pay | Admitting: Internal Medicine

## 2023-03-30 DIAGNOSIS — Z1231 Encounter for screening mammogram for malignant neoplasm of breast: Secondary | ICD-10-CM

## 2023-04-06 ENCOUNTER — Ambulatory Visit (HOSPITAL_COMMUNITY)
Admission: RE | Admit: 2023-04-06 | Discharge: 2023-04-06 | Disposition: A | Discharge status: Home / Self Care | Payer: BC Managed Care – PPO | Source: Ambulatory Visit | Attending: Family Medicine | Admitting: Family Medicine

## 2023-04-06 DIAGNOSIS — Z1231 Encounter for screening mammogram for malignant neoplasm of breast: Secondary | ICD-10-CM | POA: Diagnosis present

## 2023-04-09 ENCOUNTER — Encounter (INDEPENDENT_AMBULATORY_CARE_PROVIDER_SITE_OTHER): Payer: Self-pay | Admitting: Family Medicine

## 2023-04-09 ENCOUNTER — Ambulatory Visit (INDEPENDENT_AMBULATORY_CARE_PROVIDER_SITE_OTHER): Payer: BC Managed Care – PPO | Admitting: Family Medicine

## 2023-04-09 VITALS — BP 129/83 | HR 88 | Temp 98.4°F | Ht 66.0 in | Wt 237.0 lb

## 2023-04-09 DIAGNOSIS — R809 Proteinuria, unspecified: Secondary | ICD-10-CM

## 2023-04-09 DIAGNOSIS — Z6838 Body mass index (BMI) 38.0-38.9, adult: Secondary | ICD-10-CM

## 2023-04-09 DIAGNOSIS — E559 Vitamin D deficiency, unspecified: Secondary | ICD-10-CM | POA: Diagnosis not present

## 2023-04-09 DIAGNOSIS — E1129 Type 2 diabetes mellitus with other diabetic kidney complication: Secondary | ICD-10-CM | POA: Diagnosis not present

## 2023-04-09 DIAGNOSIS — Z7985 Long-term (current) use of injectable non-insulin antidiabetic drugs: Secondary | ICD-10-CM

## 2023-04-09 MED ORDER — TIRZEPATIDE 7.5 MG/0.5ML ~~LOC~~ SOAJ: 7.5000 mg | SUBCUTANEOUS | 0 refills | Status: DC

## 2023-04-09 NOTE — Progress Notes (Signed)
Terri [name], D.O.  ABFM, ABOM Specializing in Clinical Bariatric Medicine  Office located at: [address] [place], [place]  [address]     Assessment and Plan:   Medications Discontinued During This Encounter  Medication Reason   tirzepatide [facility]) 7.5 MG/0.5ML Pen Reorder     Meds ordered this encounter  Medications   tirzepatide (MOUNJARO) 7.5 MG/0.5ML Pen    Sig: Inject 7.5 mg into the skin once a week.    Dispense:  2 mL    Refill:  0     Type 2 diabetes mellitus with microalbuminuria, without long-term current use of insulin (HCC) Assessment & Plan: Lab Results  Component Value Date   HGBA1C 6.2 (H) 02/09/2023   HGBA1C 6.0 08/18/2022   HGBA1C 6.2 (H) 03/18/2022   DM treated with Mounjaro 7.5 mg once a wk- tolerating well, denies any N/V/D. No issues related to hunger/cravings. Fasting blood sugars have been 90-100 on average. Will refill Mounjaro today- no dose change.  Continue to decrease simple carbs/ sugars; increase fiber and proteins -> follow her meal plan.     Vitamin D deficiency Assessment & Plan: Lab Results  Component Value Date   VD25OH 34.3 02/09/2023   VD25OH 48.3 03/18/2022   VD25OH 42.6 08/22/2021   No issues with OTC Vitamin D 1,000 international units daily & ERGO 50K lU every 21 days. C/w weight loss efforts and supplementation regiment - will monitor condition expectantly.    BMI 38.0-38.9,adult - current BMI 38.27 Morbid obesity (HCC)-start bmi 41.16/date 08/22/21 Assessment & Plan: Since last office visit on 02/16/23 patient's  Muscle mass has increased by 5.4 lb. Fat mass has decreased by 6.4 lb. Total body water has increased by 10.4 lb.  Counseling done on how various foods will affect these numbers and how to maximize success  Total lbs lost to date: 18 lbs  Total weight loss percentage to date: 7.06%   No change to meal plan - see Subjective  Behavioral Intervention Additional resources provided today:  Food  Journaling Log Handout Evidence-based interventions for health behavior change were utilized today including the discussion of self monitoring techniques, problem-solving barriers and SMART goal setting techniques.   Regarding patient's less desirable eating habits and patterns, we employed the technique of small changes.  Pt will specifically work on: drinking at least 110+ ounces of water qd & continue journaling for next visit.    FOLLOW UP: Return in about 4 weeks (around 05/07/2023). She was informed of the importance of frequent follow up visits to maximize her success with intensive lifestyle modifications for her multiple health conditions.  Subjective:   Chief complaint: Obesity Terri [name] is here to discuss her progress with her obesity treatment plan. She is keeping a food journal and adhering to recommended goals of 1000-1100 calories and 80+ protein and states she is following her eating plan approximately 90% of the time. She states she is walking and treadmill 60 minutes 3 days per week.  Interval History:  Terri [name] is here for a follow up office visit. Since last OV, Terri [name] has been doing well. She is averaging 1,000-1,150 calories & 85-90 grams protein daily. Hunger and cravings are well controlled. No complaints with meal plan. Reports being low in her water consumption.   Pharmacotherapy for weight loss: She is currently taking  Mounjaro 7.5 mg once a wk  for medical weight loss.  Denies side effects.    Review of Systems:  Pertinent positives were addressed  with patient today.  Reviewed by clinician on day of visit: allergies, medications, problem list, medical history, surgical history, family history, social history, and previous encounter notes.  Weight Summary and Biometrics   Weight Lost Since Last Visit: 1lb  Weight Gained Since Last Visit: 0lb   Vitals Temp: 98.4 F (36.9 C) BP: 129/83 Pulse Rate: 88 SpO2: 100 %   Anthropometric  Measurements Height: 5\' 6"  (1.676 m) Weight: 237 lb (107.5 kg) BMI (Calculated): 38.27 Weight at Last Visit: 238lb Weight Lost Since Last Visit: 1lb Weight Gained Since Last Visit: 0lb Starting Weight: 255lb Total Weight Loss (lbs): 18 lb (8.165 kg) Peak Weight: 260lb   Body Composition  Body Fat %: 42.8 % Fat Mass (lbs): 101.6 lbs Muscle Mass (lbs): 128.8 lbs Total Body Water (lbs): 102.2 lbs Visceral Fat Rating : 13   Other Clinical Data Fasting: no Labs: no Today's Visit #: 25 Starting Date: 08/22/21   Objective:   PHYSICAL EXAM: Blood pressure 129/83, pulse 88, temperature 98.4 F (36.9 C), height 5\' 6"  (1.676 m), weight 237 lb (107.5 kg), SpO2 100%. Body mass index is 38.25 kg/m.  General: Well Developed, well nourished, and in no acute distress.  HEENT: Normocephalic, atraumatic Skin: Warm and dry, cap RF less 2 sec, good turgor Chest:  Normal excursion, shape, no gross abn Respiratory: speaking in full sentences, no conversational dyspnea NeuroM-Sk: Ambulates w/o assistance, moves * 4 Psych: A and O *3, insight good, mood-full  DIAGNOSTIC DATA REVIEWED:  BMET    Component Value Date/Time   NA 140 03/18/2022 0812   K 4.6 03/18/2022 0812   CL 100 03/18/2022 0812   CO2 25 03/18/2022 0812   GLUCOSE 99 03/18/2022 0812   GLUCOSE 125 (H) 11/28/2017 2010   BUN 16 03/18/2022 0812   CREATININE 0.73 03/18/2022 0812   CALCIUM 9.4 03/18/2022 0812   GFRNONAA >60 11/28/2017 2010   GFRAA >60 11/28/2017 2010   Lab Results  Component Value Date   HGBA1C 6.2 (H) 02/09/2023   HGBA1C 7.4 (H) 02/28/2021   No results found for: "INSULIN" Lab Results  Component Value Date   TSH 1.650 03/18/2022   CBC    Component Value Date/Time   WBC 4.8 03/18/2022 0812   WBC 6.6 11/28/2017 2010   RBC 3.98 03/18/2022 0812   RBC 4.31 11/28/2017 2010   HGB 11.7 03/18/2022 0812   HCT 35.0 03/18/2022 0812   PLT 209 03/18/2022 0812   MCV 88 03/18/2022 0812   MCH 29.4  03/18/2022 0812   MCH 29.0 11/28/2017 2010   MCHC 33.4 03/18/2022 0812   MCHC 30.9 11/28/2017 2010   RDW 12.4 03/18/2022 0812   Iron Studies No results found for: "IRON", "TIBC", "FERRITIN", "IRONPCTSAT" Lipid Panel     Component Value Date/Time   CHOL 129 03/18/2022 0812   TRIG 98 03/18/2022 0812   HDL 58 03/18/2022 0812   CHOLHDL 2.2 03/18/2022 0812   LDLCALC 53 03/18/2022 0812   Hepatic Function Panel     Component Value Date/Time   PROT 7.1 03/18/2022 0812   ALBUMIN 4.6 03/18/2022 0812   AST 12 03/18/2022 0812   ALT 11 03/18/2022 0812   ALKPHOS 47 03/18/2022 0812   BILITOT 0.3 03/18/2022 0812   BILIDIR 0.2 03/24/2016 0650   IBILI 0.5 03/24/2016 0650      Component Value Date/Time   TSH 1.650 03/18/2022 0812   Nutritional Lab Results  Component Value Date   VD25OH 34.3 02/09/2023   VD25OH 48.3 03/18/2022  VD25OH 42.6 08/22/2021    Attestations:   I, Terri [name], acting as a Stage manager for [employer], Terri [name]., have compiled all relevant documentation for today's office visit on behalf of Terri Lot, Terri [name], while in the presence of [employer], Terri [name].  I have reviewed the above documentation for accuracy and completeness, and I agree with the above. Terri [name], D.O.  The 21st Century Cures Act was signed into law in 2016 which includes the topic of electronic health records.  This provides immediate access to information in MyChart.  This includes consultation notes, operative notes, office notes, lab results and pathology reports.  If you have any questions about what you read please let us know at your next visit so we can discuss your concerns and take corrective action if need be.  We are right here with you.

## 2023-05-01 ENCOUNTER — Encounter: Payer: Self-pay | Admitting: Internal Medicine

## 2023-05-01 ENCOUNTER — Ambulatory Visit (INDEPENDENT_AMBULATORY_CARE_PROVIDER_SITE_OTHER): Payer: BC Managed Care – PPO | Admitting: Internal Medicine

## 2023-05-01 VITALS — BP 129/77 | HR 90 | Resp 15 | Ht 66.0 in | Wt 238.8 lb

## 2023-05-01 DIAGNOSIS — E1169 Type 2 diabetes mellitus with other specified complication: Secondary | ICD-10-CM | POA: Diagnosis not present

## 2023-05-01 DIAGNOSIS — Z0001 Encounter for general adult medical examination with abnormal findings: Secondary | ICD-10-CM

## 2023-05-01 DIAGNOSIS — E669 Obesity, unspecified: Secondary | ICD-10-CM

## 2023-05-01 DIAGNOSIS — E1159 Type 2 diabetes mellitus with other circulatory complications: Secondary | ICD-10-CM

## 2023-05-01 DIAGNOSIS — Z23 Encounter for immunization: Secondary | ICD-10-CM | POA: Diagnosis not present

## 2023-05-01 DIAGNOSIS — I152 Hypertension secondary to endocrine disorders: Secondary | ICD-10-CM

## 2023-05-01 DIAGNOSIS — E785 Hyperlipidemia, unspecified: Secondary | ICD-10-CM

## 2023-05-01 DIAGNOSIS — Z7984 Long term (current) use of oral hypoglycemic drugs: Secondary | ICD-10-CM

## 2023-05-01 DIAGNOSIS — E559 Vitamin D deficiency, unspecified: Secondary | ICD-10-CM

## 2023-05-01 DIAGNOSIS — M7631 Iliotibial band syndrome, right leg: Secondary | ICD-10-CM

## 2023-05-01 MED ORDER — ROSUVASTATIN CALCIUM 10 MG PO TABS
10.0000 mg | ORAL_TABLET | Freq: Every day | ORAL | 3 refills | Status: DC
Start: 2023-05-01 — End: 2023-05-06

## 2023-05-01 NOTE — Assessment & Plan Note (Signed)
Remains adequately controlled on current antihypertensive regimen.  No medication changes are indicated today.

## 2023-05-01 NOTE — Assessment & Plan Note (Signed)
A1c 6.2 in July.  She is currently prescribed metformin 500 mg twice daily and Mounjaro 7.5 mg weekly. -She was congratulated on maintaining adequate diabetes control.  No medication changes have been made today. -Repeat urine microalbumin/creatinine ratio ordered -Diabetic eye exam completed last month -Consider discontinuing metformin at future appointment if diabetes remains well-controlled.

## 2023-05-01 NOTE — Assessment & Plan Note (Signed)
Currently prescribed rosuvastatin 10 mg daily.  Lipid panel last updated in August 2023.  Repeat lipid panel ordered today.

## 2023-05-01 NOTE — Assessment & Plan Note (Signed)
Influenza vaccine administered today.

## 2023-05-01 NOTE — Assessment & Plan Note (Addendum)
She endorses right lateral hip and knee pain today.  Pain has worsened since returning to work.  Her history and exam findings today seem most consistent with IT band syndrome.  She was provided with home PT exercises today and instructed to continue as needed use of Tylenol for pain relief.  She will return to care if symptoms worsen or fail to improve.

## 2023-05-01 NOTE — Assessment & Plan Note (Signed)
Currently prescribed vitamin D supplementation (50,000 IU) every 3 weeks.  Repeat vitamin D level ordered today.

## 2023-05-01 NOTE — Assessment & Plan Note (Signed)
Annual exam completed today.  Available records and labs reviewed. -Repeat labs ordered today -Influenza vaccine administered -We will tentatively plan for follow-up in 6 months

## 2023-05-01 NOTE — Patient Instructions (Signed)
It was a pleasure to see you today.  Thank you for giving [place] the opportunity to be involved in your care.  Below is a brief recap of your visit and next steps.  We will plan to see you again in 6 months.  Summary Annual exam completed today Repeat labs ordered Rosuvastatin refilled Flu shot today We will follow up in 6 months

## 2023-05-01 NOTE — Progress Notes (Signed)
Complete physical exam  Patient: Terri [name]   DOB: 1966/07/15   57 y.o. Female  MRN: [id number]  Subjective:    Chief Complaint  Patient presents with   Annual Exam    She ran out of crestor refills from her old provider and she stopped taking it. Needs refill if she is to be on it daily    Hip Pain    Has been having some off and on right knee and hip pain that worsens when the weather changes or when she gets cold. Usually a deep dull ache   Knee Pain    Terri [name] is a 57 y.o. female who presents today for a complete physical exam. She reports consuming a  fixed 1000-1200 calorie  diet. Gym/ health club routine includes light weights, stationary bike, and walking on track . She generally feels well. She reports sleeping fairly well. She does have additional problems to discuss today.  She endorses intermittent pain along the lateral aspects of her right hip and knee.  Pain seems to have intensified after returning to work earlier this month.  It was previously well-controlled when she was able to regularly exercise in the gym.  She uses Tylenol as needed for pain relief.   Most recent fall risk assessment:    05/01/2023    3:16 PM  Fall Risk   Falls in the past year? 0  Number falls in past yr: 0  Injury with Fall? 0     Most recent depression screenings:    05/01/2023    3:17 PM 01/23/2023    8:23 AM  PHQ 2/9 Scores  PHQ - 2 Score 0 0  PHQ- 9 Score 4 3    Vision:Within last year and Dental: No current dental problems and Receives regular dental care  Past Medical History:  Diagnosis Date   Arthritis    right hip and knee pain; had right ankle fusion   Back pain    Diabetes mellitus without complication (HCC)    Edema, lower extremity    Hip pain    Hyperlipidemia    Hypertension    Joint pain    Knee pain    Past Surgical History:  Procedure Laterality Date   ANKLE FUSION Right    APPENDECTOMY     CHOLECYSTECTOMY N/A 03/21/2016    Procedure: LAPAROSCOPIC CHOLECYSTECTOMY;  Surgeon: Franky Macho, MD;  Location: [facility];  Service: General;  Laterality: N/A;   TOTAL ABDOMINAL HYSTERECTOMY     Social History   Tobacco Use   Smoking status: Never   Smokeless tobacco: Never  Vaping Use   Vaping status: Never Used  Substance Use Topics   Alcohol use: No   Drug use: No   Family History  Problem Relation Age of Onset   Diabetes Mother    High blood pressure Mother    Stroke Mother    Obesity Mother    Sudden death Father    Allergies  Allergen Reactions   Penicillins     Develops yeast infections when on medication   Patient Care Team: [name], MD as PCP - General (Internal Medicine)   Outpatient Medications Prior to Visit  Medication Sig   amLODipine (NORVASC) 5 MG tablet TAKE 1 TABLET DAILY   ketoconazole 2%-triamcinolone 0.1% 1:2 cream mixture Apply topically daily as needed for rash.   metFORMIN (GLUCOPHAGE) 500 MG tablet Take 1 tablet (500 mg total) by mouth 2 (two) times daily with a meal.  ONETOUCH ULTRA test strip TEST 3 TIMES A DAY   tirzepatide (MOUNJARO) 7.5 MG/0.5ML Pen Inject 7.5 mg into the skin once a week.   valsartan-hydrochlorothiazide (DIOVAN-HCT) 320-25 MG tablet Take 1 tablet by mouth daily.   Vitamin D, Ergocalciferol, (DRISDOL) 1.25 MG (50000 UNIT) CAPS capsule 1 po q 21 days   [DISCONTINUED] rosuvastatin (CRESTOR) 10 MG tablet Take 1 tablet (10 mg total) by mouth daily. (Patient not taking: Reported on 05/01/2023)   No facility-administered medications prior to visit.   Review of Systems  Musculoskeletal:  Positive for joint pain (R knee and hip pain).      Objective:     BP 129/77   Pulse 90   Resp 15   Ht 5\' 6"  (1.676 m)   Wt 238 lb 12.8 oz (108.3 kg)   SpO2 96%   BMI 38.54 kg/m  BP Readings from Last 3 Encounters:  05/01/23 129/77  04/09/23 129/83  02/16/23 126/84   Physical Exam Vitals reviewed.  Constitutional:      General: She is not in acute  distress.    Appearance: Normal appearance. She is obese. She is not toxic-appearing.  HENT:     Head: Normocephalic and atraumatic.     Right Ear: External ear normal.     Left Ear: External ear normal.     Nose: Nose normal. No congestion or rhinorrhea.     Mouth/Throat:     Mouth: Mucous membranes are moist.     Pharynx: Oropharynx is clear. No oropharyngeal exudate or posterior oropharyngeal erythema.  Eyes:     General: No scleral icterus.    Conjunctiva/sclera: Conjunctivae normal.     Pupils: Pupils are equal, round, and reactive to light.  Cardiovascular:     Rate and Rhythm: Normal rate and regular rhythm.  Pulmonary:     Effort: Pulmonary effort is normal.     Breath sounds: Normal breath sounds. No wheezing, rhonchi or rales.  Abdominal:     General: Abdomen is flat. Bowel sounds are normal. There is no distension.     Palpations: Abdomen is soft.     Tenderness: There is no abdominal tenderness.  Musculoskeletal:        General: Tenderness (TTP over the lateral aspect of the right knee) present. Normal range of motion.     Cervical back: Normal range of motion.  Lymphadenopathy:     Cervical: No cervical adenopathy.  Skin:    General: Skin is warm and dry.     Capillary Refill: Capillary refill takes less than 2 seconds.     Coloration: Skin is not jaundiced.  Neurological:     General: No focal deficit present.     Mental Status: She is alert and oriented to person, place, and time.  Psychiatric:        Mood and Affect: Mood normal.        Behavior: Behavior normal.        Thought Content: Thought content normal.   Last CBC Lab Results  Component Value Date   WBC 4.8 03/18/2022   HGB 11.7 03/18/2022   HCT 35.0 03/18/2022   MCV 88 03/18/2022   MCH 29.4 03/18/2022   RDW 12.4 03/18/2022   PLT 209 03/18/2022   Last metabolic panel Lab Results  Component Value Date   GLUCOSE 99 03/18/2022   NA 140 03/18/2022   K 4.6 03/18/2022   CL 100 03/18/2022    CO2 25 03/18/2022   BUN 16 03/18/2022  CREATININE 0.73 03/18/2022   EGFR 97 03/18/2022   CALCIUM 9.4 03/18/2022   PHOS 2.3 (L) 03/22/2016   PROT 7.1 03/18/2022   ALBUMIN 4.6 03/18/2022   LABGLOB 2.5 03/18/2022   AGRATIO 1.8 03/18/2022   BILITOT 0.3 03/18/2022   ALKPHOS 47 03/18/2022   AST 12 03/18/2022   ALT 11 03/18/2022   ANIONGAP 11 11/28/2017   Last lipids Lab Results  Component Value Date   CHOL 129 03/18/2022   HDL 58 03/18/2022   LDLCALC 53 03/18/2022   TRIG 98 03/18/2022   CHOLHDL 2.2 03/18/2022   Last hemoglobin A1c Lab Results  Component Value Date   HGBA1C 6.2 (H) 02/09/2023   Last thyroid functions Lab Results  Component Value Date   TSH 1.650 03/18/2022   Last vitamin D Lab Results  Component Value Date   VD25OH 34.3 02/09/2023   Last vitamin B12 and Folate Lab Results  Component Value Date   VITAMINB12 378 08/22/2021      Assessment & Plan:    Routine Health Maintenance and Physical Exam  Immunization History  Administered Date(s) Administered   Influenza,inj,Quad PF,6+ Mos 04/22/2021, 04/18/2022   PFIZER(Purple Top)SARS-COV-2 Vaccination 10/08/2019, 11/01/2019, 05/05/2020, 11/20/2020, 05/18/2021   Tdap 02/10/2023   Zoster Recombinant(Shingrix) 08/09/2020, 10/04/2020    Health Maintenance  Topic Date Due   OPHTHALMOLOGY EXAM  02/20/2023   INFLUENZA VACCINE  03/12/2023   Diabetic kidney evaluation - eGFR measurement  03/19/2023   COVID-19 Vaccine (6 - 2023-24 season) 04/12/2023   Diabetic kidney evaluation - Urine ACR  05/17/2023   HEMOGLOBIN A1C  08/12/2023   FOOT EXAM  01/23/2024   Fecal DNA (Cologuard)  03/22/2024   MAMMOGRAM  04/05/2025   DTaP/Tdap/Td (2 - Td or Tdap) 02/09/2033   Hepatitis C Screening  Completed   HIV Screening  Completed   Zoster Vaccines- Shingrix  Completed   HPV VACCINES  Aged Out    Discussed health benefits of physical activity, and encouraged her to engage in regular exercise appropriate for her age  and condition.  Problem List Items Addressed This Visit       Hypertension associated with type 2 diabetes mellitus (HCC) - Primary    Remains adequately controlled on current antihypertensive regimen.  No medication changes are indicated today.      Type 2 diabetes mellitus with obesity (HCC)    A1c 6.2 in July.  She is currently prescribed metformin 500 mg twice daily and Mounjaro 7.5 mg weekly. -She was congratulated on maintaining adequate diabetes control.  No medication changes have been made today. -Repeat urine microalbumin/creatinine ratio ordered -Diabetic eye exam completed last month -Consider discontinuing metformin at future appointment if diabetes remains well-controlled.      It band syndrome, right    She endorses right lateral hip and knee pain today.  Pain has worsened since returning to work.  Her history and exam findings today seem most consistent with IT band syndrome.  She was provided with home PT exercises today and instructed to continue as needed use of Tylenol for pain relief.  She will return to care if symptoms worsen or fail to improve.      Encounter for well adult exam with abnormal findings    Annual exam completed today.  Available records and labs reviewed. -Repeat labs ordered today -Influenza vaccine administered -We will tentatively plan for follow-up in 6 months      HLD (hyperlipidemia)    Currently prescribed rosuvastatin 10 mg daily.  Lipid panel  last updated in August 2023.  Repeat lipid panel ordered today.      Vitamin D deficiency    Currently prescribed vitamin D supplementation (50,000 IU) every 3 weeks.  Repeat vitamin D level ordered today.      Need for influenza vaccination    Influenza vaccine administered today      Return in about 6 months (around 10/29/2023).  [name], MD

## 2023-05-03 LAB — CMP14+EGFR
ALT: 24 IU/L (ref 0–32)
AST: 24 IU/L (ref 0–40)
Albumin: 4.5 g/dL (ref 3.8–4.9)
Alkaline Phosphatase: 49 IU/L (ref 44–121)
BUN/Creatinine Ratio: 25 — ABNORMAL HIGH (ref 9–23)
BUN: 16 mg/dL (ref 6–24)
Bilirubin Total: 0.2 mg/dL (ref 0.0–1.2)
CO2: 24 mmol/L (ref 20–29)
Calcium: 9.7 mg/dL (ref 8.7–10.2)
Chloride: 100 mmol/L (ref 96–106)
Creatinine, Ser: 0.63 mg/dL (ref 0.57–1.00)
Globulin, Total: 3 g/dL (ref 1.5–4.5)
Glucose: 90 mg/dL (ref 70–99)
Potassium: 4.4 mmol/L (ref 3.5–5.2)
Sodium: 139 mmol/L (ref 134–144)
Total Protein: 7.5 g/dL (ref 6.0–8.5)
eGFR: 104 mL/min/{1.73_m2} (ref >59)

## 2023-05-03 LAB — CBC WITH DIFFERENTIAL/PLATELET
Basophils Absolute: 0 10*3/uL (ref 0.0–0.2)
Basos: 0 %
EOS (ABSOLUTE): 0.1 10*3/uL (ref 0.0–0.4)
Eos: 2 %
Hematocrit: 37.2 % (ref 34.0–46.6)
Hemoglobin: 11.8 g/dL (ref 11.1–15.9)
Immature Grans (Abs): 0 10*3/uL (ref 0.0–0.1)
Immature Granulocytes: 0 %
Lymphocytes Absolute: 2.5 10*3/uL (ref 0.7–3.1)
Lymphs: 52 %
MCH: 29.4 pg (ref 26.6–33.0)
MCHC: 31.7 g/dL (ref 31.5–35.7)
MCV: 93 fL (ref 79–97)
Monocytes Absolute: 0.5 10*3/uL (ref 0.1–0.9)
Monocytes: 10 %
Neutrophils Absolute: 1.7 10*3/uL (ref 1.4–7.0)
Neutrophils: 36 %
Platelets: 225 10*3/uL (ref 150–450)
RBC: 4.01 x10E6/uL (ref 3.77–5.28)
RDW: 12.9 % (ref 11.7–15.4)
WBC: 4.7 10*3/uL (ref 3.4–10.8)

## 2023-05-03 LAB — LIPID PANEL
Chol/HDL Ratio: 3.2 ratio (ref 0.0–4.4)
Cholesterol, Total: 195 mg/dL (ref 100–199)
HDL: 61 mg/dL (ref >39)
LDL Chol Calc (NIH): 110 mg/dL — ABNORMAL HIGH (ref 0–99)
Triglycerides: 136 mg/dL (ref 0–149)
VLDL Cholesterol Cal: 24 mg/dL (ref 5–40)

## 2023-05-03 LAB — VITAMIN D 25 HYDROXY (VIT D DEFICIENCY, FRACTURES): Vit D, 25-Hydroxy: 36.5 ng/mL (ref 30.0–100.0)

## 2023-05-03 LAB — MICROALBUMIN / CREATININE URINE RATIO
Creatinine, Urine: 29.7 mg/dL
Microalb/Creat Ratio: 37 mg/g{creat} — ABNORMAL HIGH (ref 0–29)
Microalbumin, Urine: 11 ug/mL

## 2023-05-03 LAB — TSH+FREE T4
Free T4: 1.13 ng/dL (ref 0.82–1.77)
TSH: 1.14 u[IU]/mL (ref 0.450–4.500)

## 2023-05-03 LAB — B12 AND FOLATE PANEL
Folate: 7.6 ng/mL (ref >3.0)
Vitamin B-12: 360 pg/mL (ref 232–1245)

## 2023-05-04 ENCOUNTER — Other Ambulatory Visit (INDEPENDENT_AMBULATORY_CARE_PROVIDER_SITE_OTHER): Payer: Self-pay | Admitting: Family Medicine

## 2023-05-04 DIAGNOSIS — E559 Vitamin D deficiency, unspecified: Secondary | ICD-10-CM

## 2023-05-06 ENCOUNTER — Other Ambulatory Visit: Payer: Self-pay

## 2023-05-06 ENCOUNTER — Telehealth: Payer: Self-pay | Admitting: Internal Medicine

## 2023-05-06 DIAGNOSIS — E785 Hyperlipidemia, unspecified: Secondary | ICD-10-CM

## 2023-05-06 MED ORDER — ROSUVASTATIN CALCIUM 10 MG PO TABS: 20.0000 mg | ORAL_TABLET | Freq: Every day | ORAL | 3 refills | Status: DC

## 2023-05-06 NOTE — Telephone Encounter (Signed)
Patient calling says she was told by MD to take 20mg  rosuvastatin (CRESTOR) 10 MG tablet [[id number]]  instead of 10mg  and says since she is doing  that she is [place] run out sooner. Please advise Thank you

## 2023-05-06 NOTE — Telephone Encounter (Signed)
Verified by lab work patient is to increase to 20mg , rx sent in for patient

## 2023-05-13 ENCOUNTER — Ambulatory Visit (INDEPENDENT_AMBULATORY_CARE_PROVIDER_SITE_OTHER): Payer: BC Managed Care – PPO | Admitting: Family Medicine

## 2023-05-21 ENCOUNTER — Encounter (INDEPENDENT_AMBULATORY_CARE_PROVIDER_SITE_OTHER): Payer: Self-pay | Admitting: Adult Health

## 2023-05-21 ENCOUNTER — Ambulatory Visit (INDEPENDENT_AMBULATORY_CARE_PROVIDER_SITE_OTHER): Payer: BC Managed Care – PPO | Admitting: Adult Health

## 2023-05-21 VITALS — BP 117/75 | HR 83 | Temp 97.9°F | Ht 66.0 in | Wt 238.0 lb

## 2023-05-21 DIAGNOSIS — Z7985 Long-term (current) use of injectable non-insulin antidiabetic drugs: Secondary | ICD-10-CM

## 2023-05-21 DIAGNOSIS — E1159 Type 2 diabetes mellitus with other circulatory complications: Secondary | ICD-10-CM

## 2023-05-21 DIAGNOSIS — E559 Vitamin D deficiency, unspecified: Secondary | ICD-10-CM

## 2023-05-21 DIAGNOSIS — E1129 Type 2 diabetes mellitus with other diabetic kidney complication: Secondary | ICD-10-CM

## 2023-05-21 DIAGNOSIS — Z7984 Long term (current) use of oral hypoglycemic drugs: Secondary | ICD-10-CM

## 2023-05-21 DIAGNOSIS — R809 Proteinuria, unspecified: Secondary | ICD-10-CM

## 2023-05-21 DIAGNOSIS — I152 Hypertension secondary to endocrine disorders: Secondary | ICD-10-CM

## 2023-05-21 DIAGNOSIS — Z6838 Body mass index (BMI) 38.0-38.9, adult: Secondary | ICD-10-CM

## 2023-05-21 DIAGNOSIS — E669 Obesity, unspecified: Secondary | ICD-10-CM

## 2023-05-21 MED ORDER — TIRZEPATIDE 7.5 MG/0.5ML ~~LOC~~ SOAJ
7.5000 mg | SUBCUTANEOUS | 0 refills | Status: DC
Start: 2023-05-21 — End: 2023-06-18

## 2023-05-21 MED ORDER — VITAMIN D (ERGOCALCIFEROL) 1.25 MG (50000 UNIT) PO CAPS
ORAL_CAPSULE | ORAL | 0 refills | Status: DC
Start: 2023-05-21 — End: 2023-07-16

## 2023-05-21 NOTE — Progress Notes (Addendum)
WEIGHT SUMMARY AND BIOMETRICS  Vitals Temp: 97.9 F (36.6 C) BP: 117/75 Pulse Rate: 83 SpO2: 95 %   Anthropometric Measurements Height: 5\' 6"  (1.676 m) Weight: 238 lb (108 kg) BMI (Calculated): 38.43 Weight at Last Visit: 237lb Weight Lost Since Last Visit: 0 Weight Gained Since Last Visit: 1lb Starting Weight: 255lb Total Weight Loss (lbs): 17 lb (7.711 kg) Peak Weight: 260lb   Body Composition  Body Fat %: 50.4 % Fat Mass (lbs): 120.4 lbs Muscle Mass (lbs): 112.4 lbs Visceral Fat Rating : 15   Other Clinical Data Fasting: no Labs: no Today's Visit #: 26 Starting Date: 08/22/21    Chief Complaint:   OBESITY Terri [name] is here to discuss her progress with her obesity treatment plan. She is on the keeping a food journal and adhering to recommended goals of 1000-1100 calories and 80+ protein and states she is following her eating plan approximately 90 % of the time. She states she is exercising Brisk Walking 30 minutes 3 times per week.   Interim History:  Reviewed tracking log- Calorie range [phone], with mean average 1100 Protein range 74-112, most days >80g/day  Hunger/appetite-appetite well controlled on weekly Mounjaro 7.5mg .  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  Exercise-walking 30 mins, 3 x week.  Goal is to increase back to 60 mins most days of week.  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 Type 2 diabetes mellitus with microalbuminuria, without long-term current use of insulin (HCC) Lab Results  Component Value Date   HGBA1C 6.2 (H) 02/09/2023   HGBA1C 6.0 08/18/2022   HGBA1C 6.2 (H) 03/18/2022   PCP/Dr. Durwin Nora manages Metformin 500mg  BID with meals HWW manages Mounjaro 7.5mg  weekly injection Denies mass in neck, dysphagia, dyspepsia, persistent hoarseness, abdominal pain, or N/V/C  Fasting CBG range from  97-110 She denies sx's of hypoglycemia.  2. Hypertension associated with type 2 diabetes mellitus (HCC) BP at goal She is on valsartan-hydrochlorothiazide (DIOVAN-HCT) 320-25 MG tablet  metFORMIN (GLUCOPHAGE) 500 MG tablet  amLODipine (NORVASC) 5 MG tablet  rosuvastatin (CRESTOR) 10 MG tablet  tirzepatide (MOUNJARO) 7.5 MG/0.5ML Pen   She endorses bilateral ankle/foot edema with Amlodipine 5mg   3. Vitamin D deficiency  Latest Reference Range & Units 02/09/23 09:16 05/01/23 16:02  Vitamin D, 25-Hydroxy 30.0 - 100.0 ng/mL 34.3 36.5   Assessment/Plan:   1. Type 2 diabetes mellitus with microalbuminuria, without long-term current use of insulin (HCC) Refill - tirzepatide (MOUNJARO) 7.5 MG/0.5ML Pen; Inject 7.5 mg into the skin once a week.  Dispense: 2 mL; Refill: 0  2. Hypertension associated with type 2 diabetes mellitus (HCC) Continue valsartan-hydrochlorothiazide (DIOVAN-HCT) 320-25 MG tablet  metFORMIN (GLUCOPHAGE) 500 MG tablet  amLODipine (NORVASC) 5 MG tablet  rosuvastatin (CRESTOR) 10 MG tablet  tirzepatide (MOUNJARO) 7.5 MG/0.5ML Pen   Increase water and wear compression socks  3. Vitamin D deficiency Refill - Vitamin D, Ergocalciferol, (DRISDOL) 1.25 MG (50000 UNIT) CAPS capsule; 1 po q 21 days  Dispense: 6 capsule; Refill: 0  4. BMI 38.0-38.9,adult - current BMI 38.5  Genora is currently in the action stage of change. As such, her goal is to continue with weight loss efforts. She has agreed to keeping a food journal and adhering to recommended goals of 1000-1100 calories and 80+ protein.   Exercise goals: For substantial health benefits, adults should do at least 150  minutes (2 hours and 30 minutes) a week of moderate-intensity, or 75 minutes (1 hour and 15 minutes) a week of vigorous-intensity aerobic physical activity, or an equivalent combination of moderate- and vigorous-intensity aerobic activity. Aerobic activity should be performed in episodes of at least 10  minutes, and preferably, it should be spread throughout the week.  Behavioral modification strategies: increasing lean protein intake, decreasing simple carbohydrates, increasing vegetables, increasing water intake, no skipping meals, meal planning and cooking strategies, planning for success, and keeping a strict food journal.  Xitlally has agreed to follow-up with our clinic in 4 weeks. She was informed of the importance of frequent follow-up visits to maximize her success with intensive lifestyle modifications for her multiple health conditions.   Objective:   Blood pressure 117/75, pulse 83, temperature 97.9 F (36.6 C), height 5\' 6"  (1.676 m), weight 238 lb (108 kg), SpO2 95%. Body mass index is 38.41 kg/m.  General: Cooperative, alert, well developed, in no acute distress. HEENT: Conjunctivae and lids unremarkable. Cardiovascular: Regular rhythm.  Lungs: Normal work of breathing. Neurologic: No focal deficits.   Lab Results  Component Value Date   CREATININE 0.63 05/01/2023   BUN 16 05/01/2023   NA 139 05/01/2023   K 4.4 05/01/2023   CL 100 05/01/2023   CO2 24 05/01/2023   Lab Results  Component Value Date   ALT 24 05/01/2023   AST 24 05/01/2023   ALKPHOS 49 05/01/2023   BILITOT 0.2 05/01/2023   Lab Results  Component Value Date   HGBA1C 6.2 (H) 02/09/2023   HGBA1C 6.0 08/18/2022   HGBA1C 6.2 (H) 03/18/2022   HGBA1C 6.4 (H) 11/12/2021   HGBA1C 7.3 (H) 06/27/2021   No results found for: "INSULIN" Lab Results  Component Value Date   TSH 1.140 05/01/2023   Lab Results  Component Value Date   CHOL 195 05/01/2023   HDL 61 05/01/2023   LDLCALC 110 (H) 05/01/2023   TRIG 136 05/01/2023   CHOLHDL 3.2 05/01/2023   Lab Results  Component Value Date   VD25OH 36.5 05/01/2023   VD25OH 34.3 02/09/2023   VD25OH 48.3 03/18/2022   Lab Results  Component Value Date   WBC 4.7 05/01/2023   HGB 11.8 05/01/2023   HCT 37.2 05/01/2023   MCV 93 05/01/2023   PLT 225  05/01/2023   No results found for: "IRON", "TIBC", "FERRITIN"  Attestation Statements:   Reviewed by clinician on day of visit: allergies, medications, problem list, medical history, surgical history, family history, social history, and previous encounter notes.  I have reviewed the above documentation for accuracy and completeness, and I agree with the above. -  Evie Croston d. Donta Mcinroy, NP-C

## 2023-06-18 ENCOUNTER — Other Ambulatory Visit (HOSPITAL_COMMUNITY): Payer: Self-pay

## 2023-06-18 ENCOUNTER — Encounter (INDEPENDENT_AMBULATORY_CARE_PROVIDER_SITE_OTHER): Payer: Self-pay | Admitting: Family Medicine

## 2023-06-18 ENCOUNTER — Ambulatory Visit (INDEPENDENT_AMBULATORY_CARE_PROVIDER_SITE_OTHER): Payer: BC Managed Care – PPO | Admitting: Family Medicine

## 2023-06-18 VITALS — BP 126/76 | HR 84 | Temp 97.9°F | Ht 66.0 in | Wt 233.0 lb

## 2023-06-18 DIAGNOSIS — E669 Obesity, unspecified: Secondary | ICD-10-CM

## 2023-06-18 DIAGNOSIS — Z6837 Body mass index (BMI) 37.0-37.9, adult: Secondary | ICD-10-CM

## 2023-06-18 DIAGNOSIS — E1169 Type 2 diabetes mellitus with other specified complication: Secondary | ICD-10-CM

## 2023-06-18 DIAGNOSIS — E1159 Type 2 diabetes mellitus with other circulatory complications: Secondary | ICD-10-CM | POA: Diagnosis not present

## 2023-06-18 DIAGNOSIS — Z7985 Long-term (current) use of injectable non-insulin antidiabetic drugs: Secondary | ICD-10-CM

## 2023-06-18 DIAGNOSIS — Z6838 Body mass index (BMI) 38.0-38.9, adult: Secondary | ICD-10-CM

## 2023-06-18 DIAGNOSIS — I152 Hypertension secondary to endocrine disorders: Secondary | ICD-10-CM | POA: Diagnosis not present

## 2023-06-18 MED ORDER — TIRZEPATIDE 10 MG/0.5ML ~~LOC~~ SOAJ
10.0000 mg | SUBCUTANEOUS | 0 refills | Status: DC
Start: 2023-06-18 — End: 2023-07-16
  Filled 2023-06-18: qty 2, 28d supply, fill #0

## 2023-06-18 NOTE — Progress Notes (Signed)
[name], D.O.  ABFM, ABOM Specializing in Clinical Bariatric Medicine  Office located at: [address] [place], [place]  [address]   Assessment and Plan:   FOR THE DISEASE OF OBESITY:  Since last office visit on 05/21/23 patient's muscle mass has increased by 12 lb. Fat mass has decreased by 18.4 lb. Counseling done on how various foods will affect these numbers and how to maximize success  Total lbs lost to date: 22 lbs  Total weight loss percentage to date: 8.63%    Recommended Dietary Goals Terri [name] is currently in the action stage of change. As such, her goal is to continue weight management plan.  She has agreed to: continue current plan   Behavioral Intervention We discussed the following Behavioral Modification Strategies today: continue to work on maintaining a reduced calorie state, getting the recommended amount of protein, incorporating whole foods, making healthy choices, staying well hydrated and practicing mindfulness when eating..  Additional resources provided today: None  Evidence-based interventions for health behavior change were utilized today including the discussion of self monitoring techniques, problem-solving barriers and SMART goal setting techniques.   Regarding patient's less desirable eating habits and patterns, we employed the technique of small changes.   Pt will specifically work on: continue journaling intake/bringing in food log.    Recommended Physical Activity Goals Terri [name] has been advised to work up to 150 minutes of moderate intensity aerobic activity a week and strengthening exercises 2-3 times per week for cardiovascular health, weight loss maintenance and preservation of muscle mass.   She has agreed to : Continue current level of physical activity    Pharmacotherapy We discussed various medication options to help Terri [name] with her weight loss efforts and we both agreed to : increase Mounjaro to 10 mg once a week.     FOR ASSOCIATED CONDITIONS ADDRESSED TODAY:  Type 2 diabetes mellitus with obesity (HCC) Assessment & Plan: T2DM treated with Mounjaro 7.5 mg once a week and Metformin 500 mg bid. Pt tolerating both medications well, denies any adverse SE. Her sugars have been stable at home and she denies any lows or highs. Per pt's journaling log, there were several days where she exceeded her calorie parameters - this could be secondary to increased hunger signals  Shared decision is to increase Mounjaro to 10 mg once a week. Continue Metformin at current dose. Continue weight loss therapy.   Orders: -     Tirzepatide; Inject 10 mg into the skin once a week.  Dispense: 2 mL; Refill: 0   Hypertension associated with type 2 diabetes mellitus (HCC) Assessment & Plan: Last 3 blood pressure readings in our office are as follows: BP Readings from Last 3 Encounters:  06/18/23 126/76  05/21/23 117/75  05/01/23 129/77   HTN treated with Norvasc 5 mg daily and Diovan-HCT 320-25 mg daily. Blood pressure stable today; no concerns in this regard.   Continue with all antihypertensives per PCP. Continue with Prudent nutritional plan and low sodium diet, advance exercise as tolerated.   Orders: -     Tirzepatide; Inject 10 mg into the skin once a week.  Dispense: 2 mL; Refill: 0   BMI 38.0-38.9,adult - current BMI 37.63 Morbid obesity (HCC)-start bmi 41.16/date 08/22/21 Assessment & Plan: See obesity treatment plan.    FOLLOW UP: Return 07/16/23. She was informed of the importance of frequent follow up visits to maximize her success with intensive lifestyle modifications for her multiple health conditions.  Subjective:  Chief complaint: Obesity Terri [name] is here to discuss her progress with her obesity treatment plan. She is keeping a food journal and adhering to recommended goals of 1000-1100 calories and 80+ protein and states she is following her eating plan approximately 80-85% of the time. She  states she is using the treadmill 30-60 minutes, 2 days a week and doing some resistance training.   Interval History:  Terri [name] is here for a follow up office visit. Since last OV, Terri [name] is down 5 lbs. According to her journaling log, pt was over in calories on a few occasions. She is almost always obtaining her protein goals. Drinks roughly 80 ounces of water daily.   Barriers identified: none.   Pharmacotherapy for weight loss: She is currently taking  Metformin 500 mg bid and Mounjaro 7.5 mg once a week.  .   Review of Systems:  Pertinent positives were addressed with patient today.  Reviewed by clinician on day of visit: allergies, medications, problem list, medical history, surgical history, family history, social history, and previous encounter notes.  Weight Summary and Biometrics   Weight Lost Since Last Visit: 5lb  Weight Gained Since Last Visit: 0lb   Vitals Temp: 97.9 F (36.6 C) BP: 126/76 Pulse Rate: 84 SpO2: 98 %   Anthropometric Measurements Height: 5\' 6"  (1.676 m) Weight: 233 lb (105.7 kg) BMI (Calculated): 37.63 Weight at Last Visit: 238lb Weight Lost Since Last Visit: 5lb Weight Gained Since Last Visit: 0lb Starting Weight: 255lb Total Weight Loss (lbs): 22 lb (9.979 kg) Peak Weight: 260lb   Body Composition  Body Fat %: 43.8 % Fat Mass (lbs): 102 lbs Muscle Mass (lbs): 124.4 lbs Total Body Water (lbs): 94.2 lbs Visceral Fat Rating : 13   Other Clinical Data Fasting: no Labs: no Today's Visit #: 27 Starting Date: 08/22/21   Objective:   PHYSICAL EXAM: Blood pressure 126/76, pulse 84, temperature 97.9 F (36.6 C), height 5\' 6"  (1.676 m), weight 233 lb (105.7 kg), SpO2 98%. Body mass index is 37.61 kg/m.  General: she is overweight, cooperative and in no acute distress. PSYCH: Has normal mood, affect and thought process.   HEENT: EOMI, sclerae are anicteric. Lungs: Normal breathing effort, no conversational  dyspnea. Extremities: Moves * 4 Neurologic: A and O * 3, good insight  DIAGNOSTIC DATA REVIEWED: BMET    Component Value Date/Time   NA 139 05/01/2023 1602   K 4.4 05/01/2023 1602   CL 100 05/01/2023 1602   CO2 24 05/01/2023 1602   GLUCOSE 90 05/01/2023 1602   GLUCOSE 125 (H) 11/28/2017 2010   BUN 16 05/01/2023 1602   CREATININE 0.63 05/01/2023 1602   CALCIUM 9.7 05/01/2023 1602   GFRNONAA >60 11/28/2017 2010   GFRAA >60 11/28/2017 2010   Lab Results  Component Value Date   HGBA1C 6.2 (H) 02/09/2023   HGBA1C 7.4 (H) 02/28/2021   No results found for: "INSULIN" Lab Results  Component Value Date   TSH 1.140 05/01/2023   CBC    Component Value Date/Time   WBC 4.7 05/01/2023 1602   WBC 6.6 11/28/2017 2010   RBC 4.01 05/01/2023 1602   RBC 4.31 11/28/2017 2010   HGB 11.8 05/01/2023 1602   HCT 37.2 05/01/2023 1602   PLT 225 05/01/2023 1602   MCV 93 05/01/2023 1602   MCH 29.4 05/01/2023 1602   MCH 29.0 11/28/2017 2010   MCHC 31.7 05/01/2023 1602   MCHC 30.9 11/28/2017 2010   RDW 12.9 05/01/2023 1602  Iron Studies No results found for: "IRON", "TIBC", "FERRITIN", "IRONPCTSAT" Lipid Panel     Component Value Date/Time   CHOL 195 05/01/2023 1602   TRIG 136 05/01/2023 1602   HDL 61 05/01/2023 1602   CHOLHDL 3.2 05/01/2023 1602   LDLCALC 110 (H) 05/01/2023 1602   Hepatic Function Panel     Component Value Date/Time   PROT 7.5 05/01/2023 1602   ALBUMIN 4.5 05/01/2023 1602   AST 24 05/01/2023 1602   ALT 24 05/01/2023 1602   ALKPHOS 49 05/01/2023 1602   BILITOT 0.2 05/01/2023 1602   BILIDIR 0.2 03/24/2016 0650   IBILI 0.5 03/24/2016 0650      Component Value Date/Time   TSH 1.140 05/01/2023 1602   Nutritional Lab Results  Component Value Date   VD25OH 36.5 05/01/2023   VD25OH 34.3 02/09/2023   VD25OH 48.3 03/18/2022    Attestations:   I, Special Puri, acting as a Stage manager for [employer], DO., have compiled all relevant documentation for  today's office visit on behalf of Thomasene Lot, DO, while in the presence of [employer], DO.  I have reviewed the above documentation for accuracy and completeness, and I agree with the above. [name], D.O.  The 21st Century Cures Act was signed into law in 2016 which includes the topic of electronic health records.  This provides immediate access to information in MyChart.  This includes consultation notes, operative notes, office notes, lab results and pathology reports.  If you have any questions about what you read please let us know at your next visit so we can discuss your concerns and take corrective action if need be.  We are right here with you.

## 2023-07-16 ENCOUNTER — Encounter (INDEPENDENT_AMBULATORY_CARE_PROVIDER_SITE_OTHER): Payer: Self-pay | Admitting: Family Medicine

## 2023-07-16 ENCOUNTER — Ambulatory Visit (INDEPENDENT_AMBULATORY_CARE_PROVIDER_SITE_OTHER): Payer: BC Managed Care – PPO | Admitting: Family Medicine

## 2023-07-16 ENCOUNTER — Other Ambulatory Visit (HOSPITAL_COMMUNITY): Payer: Self-pay

## 2023-07-16 VITALS — BP 126/77 | HR 92 | Temp 97.7°F | Ht 66.0 in | Wt 235.0 lb

## 2023-07-16 DIAGNOSIS — E559 Vitamin D deficiency, unspecified: Secondary | ICD-10-CM

## 2023-07-16 DIAGNOSIS — E1169 Type 2 diabetes mellitus with other specified complication: Secondary | ICD-10-CM | POA: Diagnosis not present

## 2023-07-16 DIAGNOSIS — I152 Hypertension secondary to endocrine disorders: Secondary | ICD-10-CM | POA: Diagnosis not present

## 2023-07-16 DIAGNOSIS — E1159 Type 2 diabetes mellitus with other circulatory complications: Secondary | ICD-10-CM

## 2023-07-16 DIAGNOSIS — Z6837 Body mass index (BMI) 37.0-37.9, adult: Secondary | ICD-10-CM

## 2023-07-16 DIAGNOSIS — Z6838 Body mass index (BMI) 38.0-38.9, adult: Secondary | ICD-10-CM

## 2023-07-16 DIAGNOSIS — Z7984 Long term (current) use of oral hypoglycemic drugs: Secondary | ICD-10-CM

## 2023-07-16 DIAGNOSIS — Z7985 Long-term (current) use of injectable non-insulin antidiabetic drugs: Secondary | ICD-10-CM

## 2023-07-16 MED ORDER — VITAMIN D (ERGOCALCIFEROL) 1.25 MG (50000 UNIT) PO CAPS
50000.0000 [IU] | ORAL_CAPSULE | ORAL | 0 refills | Status: DC
  Filled 2023-07-16: qty 6, 60d supply, fill #0

## 2023-07-16 MED ORDER — TIRZEPATIDE 12.5 MG/0.5ML ~~LOC~~ SOAJ
12.5000 mg | SUBCUTANEOUS | 1 refills | Status: DC
  Filled 2023-07-16: qty 2, 28d supply, fill #0

## 2023-07-16 NOTE — Progress Notes (Signed)
[name], D.O.  ABFM, ABOM Specializing in Clinical Bariatric Medicine  Office located at: [address] [place], [place]  [address]   Assessment and Plan:   FOR THE DISEASE OF OBESITY: BMI 38.0-38.9,adult - current BMI 37.95 Morbid obesity (HCC)-start bmi 41.16/date 08/22/21 Assessment & Plan: Since last office visit on 06/18/23 patient's muscle mass has decreased by 12.8 lb. Fat mass has decreased by 16.8 lb. Total body water has decreased by 1 lb. These biometric's are likely not accurate as pt has not made any significant changes to her eating habits since LOV.    Total lbs lost to date: 20 lbs  Total weight loss percentage to date: 7.84%    Recommended Dietary Goals Olethia is currently in the action stage of change. As such, her goal is to continue weight management plan.  We modified her journaling parameters to 1300-1400 calories and 100+ grams protein since she will be exercising more.    Behavioral Intervention We discussed the following today: continue to work on maintaining a reduced calorie state, getting the recommended amount of protein, incorporating whole foods, making healthy choices, staying well hydrated and practicing mindfulness when eating.  Additional resources provided today:  Handout on resistance band exercises.   Evidence-based interventions for health behavior change were utilized today including the discussion of self monitoring techniques, problem-solving barriers and SMART goal setting techniques.   Regarding patient's less desirable eating habits and patterns, we employed the technique of small changes.   Pt will specifically work on: n/a   Recommended Physical Activity Goals Elisabeth has been advised to work up to 150 minutes of moderate intensity aerobic activity a week and strengthening exercises 2-3 times per week for cardiovascular health, weight loss maintenance and preservation of muscle mass.   She has agreed to :  2 sets of  10 reps of resistance band exercises, 2-3 days a week and altering speeds while walking.     Pharmacotherapy We both agreed to : increase Mounjaro to 12.5 mg once a week   FOR ASSOCIATED CONDITIONS ADDRESSED TODAY:  Type 2 diabetes mellitus with obesity (HCC) Assessment & Plan: Most recent hemoglobin A1c: Lab Results  Component Value Date   HGBA1C 6.2 (H) 02/09/2023   HGBA1C 6.0 08/18/2022   HGBA1C 6.2 (H) 03/18/2022    T2DM treated with Mounjaro 10 mg once a week and Metformin 500 mg bid.  She had a bit of constipation from the [facility] 10 mg, which improved when she had more water. Her fasting blood sugars are roughly 102.   I recommended the patient to drink approximately half of their body weight in ounces of water daily. Additionally, have an extra bottle of water for every 30 minutes of exercise. Also recommended Miralax if she has constipation in the future. Will increase Mounjaro to 12.5 mg once a week to further aid with weight loss efforts.   Orders: -     Hemoglobin A1c -     Tirzepatide; Inject 12.5 mg into the skin once a week.  Dispense: 2 mL; Refill: 1   Hypertension associated with type 2 diabetes mellitus (HCC) Assessment & Plan: Last 3 blood pressure readings in our office are as follows: BP Readings from Last 3 Encounters:  07/16/23 126/77  06/18/23 126/76  05/21/23 117/75   HTN treated with Norvasc 5 mg daily and Diovan-HCT 320-25 mg daily. Blood pressure stable today; no concerns in this regard. Pt advised to maintain with all antihypertensives at current dose. Continue  with low sodium diet and current exercise regiment.    Vitamin D deficiency Assessment & Plan: Lab Results  Component Value Date   VD25OH 36.5 05/01/2023   VD25OH 34.3 02/09/2023   VD25OH 48.3 03/18/2022   She is currently on ERGO 50,000 units once every 21 days. Since her most recent vitamin D levels were sub-optimal at 36.5, pt instructed to take ERGO every 10 days.   Orders:  -      VITAMIN D 25 Hydroxy (Vit-D Deficiency, Fractures) -     Vitamin D (Ergocalciferol); 1 po q 10 days  Dispense: 6 capsule; Refill: 0   Follow up:   Return 08/25/23. She was informed of the importance of frequent follow up visits to maximize her success with intensive lifestyle modifications for her multiple health conditions.  DANEILLE WINOKUR is aware that we will review all of her lab results at our next visit.  She is aware that if anything is critical/ life threatening with the results, we will be contacting her via MyChart prior to the office visit to discuss management.    Subjective:   Chief complaint: Obesity Kiera is here to discuss her progress with her obesity treatment plan. She is keeping a food journal and adhering to recommended goals of 1000-1100 calories and 80+ protein and states she is following her eating plan approximately 80-85% of the time. She states she is walking on the track 60 minutes, 3 days a week.   Interval History:  SYREETA BRAUTIGAN is here for a follow up office visit. Since last OV, Shristi is up 2 lbs. Per her journaling log, she is averaging 1006-1270 calories and 80-96 grams of protein. She is drinking 80 ounces of water daily. Clothes are feeling looser.   Pharmacotherapy for weight loss: She is currently taking  Mounjaro 10 mg once a week & Metformin 500 mg bid  .   Review of Systems:  Pertinent positives were addressed with patient today.  Reviewed by clinician on day of visit: allergies, medications, problem list, medical history, surgical history, family history, social history, and previous encounter notes.  Weight Summary and Biometrics   Weight Lost Since Last Visit: 0lb  Weight Gained Since Last Visit: 2lb   Vitals Temp: 97.7 F (36.5 C) BP: 126/77 Pulse Rate: 92 SpO2: 98 %   Anthropometric Measurements Height: 5\' 6"  (1.676 m) Weight: 235 lb (106.6 kg) BMI (Calculated): 37.95 Weight at Last Visit: 233lb Weight Lost  Since Last Visit: 0lb Weight Gained Since Last Visit: 2lb Starting Weight: 255lb Total Weight Loss (lbs): 20 lb (9.072 kg) Peak Weight: 260lb   Body Composition  Body Fat %: 50.1 % Fat Mass (lbs): 118.8 lbs Muscle Mass (lbs): 111.6 lbs Total Body Water (lbs): 93.2 lbs Visceral Fat Rating : 15   Other Clinical Data Fasting: no Labs: no Today's Visit #: 298 Starting Date: 08/22/21   Objective:   PHYSICAL EXAM: Blood pressure 126/77, pulse 92, temperature 97.7 F (36.5 C), height 5\' 6"  (1.676 m), weight 235 lb (106.6 kg), SpO2 98%. Body mass index is 37.93 kg/m.  General: she is overweight, cooperative and in no acute distress. PSYCH: Has normal mood, affect and thought process.   HEENT: EOMI, sclerae are anicteric. Lungs: Normal breathing effort, no conversational dyspnea. Extremities: Moves * 4 Neurologic: A and O * 3, good insight  DIAGNOSTIC DATA REVIEWED: BMET    Component Value Date/Time   NA 139 05/01/2023 1602   K 4.4 05/01/2023 1602   CL  100 05/01/2023 1602   CO2 24 05/01/2023 1602   GLUCOSE 90 05/01/2023 1602   GLUCOSE 125 (H) 11/28/2017 2010   BUN 16 05/01/2023 1602   CREATININE 0.63 05/01/2023 1602   CALCIUM 9.7 05/01/2023 1602   GFRNONAA >60 11/28/2017 2010   GFRAA >60 11/28/2017 2010   Lab Results  Component Value Date   HGBA1C 6.2 (H) 02/09/2023   HGBA1C 7.4 (H) 02/28/2021   No results found for: "INSULIN" Lab Results  Component Value Date   TSH 1.140 05/01/2023   CBC    Component Value Date/Time   WBC 4.7 05/01/2023 1602   WBC 6.6 11/28/2017 2010   RBC 4.01 05/01/2023 1602   RBC 4.31 11/28/2017 2010   HGB 11.8 05/01/2023 1602   HCT 37.2 05/01/2023 1602   PLT 225 05/01/2023 1602   MCV 93 05/01/2023 1602   MCH 29.4 05/01/2023 1602   MCH 29.0 11/28/2017 2010   MCHC 31.7 05/01/2023 1602   MCHC 30.9 11/28/2017 2010   RDW 12.9 05/01/2023 1602   Iron Studies No results found for: "IRON", "TIBC", "FERRITIN", "IRONPCTSAT" Lipid  Panel     Component Value Date/Time   CHOL 195 05/01/2023 1602   TRIG 136 05/01/2023 1602   HDL 61 05/01/2023 1602   CHOLHDL 3.2 05/01/2023 1602   LDLCALC 110 (H) 05/01/2023 1602   Hepatic Function Panel     Component Value Date/Time   PROT 7.5 05/01/2023 1602   ALBUMIN 4.5 05/01/2023 1602   AST 24 05/01/2023 1602   ALT 24 05/01/2023 1602   ALKPHOS 49 05/01/2023 1602   BILITOT 0.2 05/01/2023 1602   BILIDIR 0.2 03/24/2016 0650   IBILI 0.5 03/24/2016 0650      Component Value Date/Time   TSH 1.140 05/01/2023 1602   Nutritional Lab Results  Component Value Date   VD25OH 36.5 05/01/2023   VD25OH 34.3 02/09/2023   VD25OH 48.3 03/18/2022    Attestations:   I, Special Puri, acting as a Stage manager for [employer], DO., have compiled all relevant documentation for today's office visit on behalf of Thomasene Lot, DO, while in the presence of [employer], DO.  Patient was in the office today and time spent on visit including pre-visit chart review and post-visit care/coordination of care and electronic medical record documentation was 40 minutes. 50% of the time was in face to face counseling of this patient's medical condition(s) and providing education on treatment options to include the first-line treatment of diet and lifestyle modification.   I have reviewed the above documentation for accuracy and completeness, and I agree with the above. [name], D.O.  The 21st Century Cures Act was signed into law in 2016 which includes the topic of electronic health records.  This provides immediate access to information in MyChart.  This includes consultation notes, operative notes, office notes, lab results and pathology reports.  If you have any questions about what you read please let us know at your next visit so we can discuss your concerns and take corrective action if need be.  We are right here with you.

## 2023-07-17 LAB — VITAMIN D 25 HYDROXY (VIT D DEFICIENCY, FRACTURES): Vit D, 25-Hydroxy: 42.9 ng/mL (ref 30.0–100.0)

## 2023-07-17 LAB — HEMOGLOBIN A1C
Est. average glucose Bld gHb Est-mCnc: 126 mg/dL
Hgb A1c MFr Bld: 6 % — ABNORMAL HIGH (ref 4.8–5.6)

## 2023-07-20 ENCOUNTER — Other Ambulatory Visit (HOSPITAL_COMMUNITY): Payer: Self-pay

## 2023-07-20 ENCOUNTER — Other Ambulatory Visit (HOSPITAL_BASED_OUTPATIENT_CLINIC_OR_DEPARTMENT_OTHER): Payer: Self-pay

## 2023-07-20 ENCOUNTER — Telehealth (INDEPENDENT_AMBULATORY_CARE_PROVIDER_SITE_OTHER): Payer: Self-pay

## 2023-07-20 ENCOUNTER — Telehealth (INDEPENDENT_AMBULATORY_CARE_PROVIDER_SITE_OTHER): Payer: Self-pay | Admitting: Family Medicine

## 2023-07-20 MED ORDER — TIRZEPATIDE 10 MG/0.5ML ~~LOC~~ SOAJ
10.0000 mg | SUBCUTANEOUS | 0 refills | Status: DC
Start: 2023-07-20 — End: 2023-08-25
  Filled 2023-07-20: qty 2, 28d supply, fill #0

## 2023-07-20 NOTE — Telephone Encounter (Signed)
Pt requested 10 mg. Dr Sharee Holster approved. Sent to [facility]. Patient notified.

## 2023-07-20 NOTE — Telephone Encounter (Signed)
12/09 Med change req. [facility]  Pt would like to change Mounjaro RX back to . Due to prior auth for 12ml being denied. Please contact PT. [facility]

## 2023-07-20 NOTE — Telephone Encounter (Signed)
Key: [id number] Prior Joylene Draft Awaiting insurance determination

## 2023-07-20 NOTE — Addendum Note (Signed)
Addended by: Sande Brothers on: 07/20/2023 02:20 PM   Modules accepted: Orders

## 2023-07-23 IMAGING — MG MM DIGITAL SCREENING BILAT W/ TOMO AND CAD
8 of 15 series · 8 of 40 positions shown · non-contrast
Comparison: Previous exam(s).

ACR Breast Density Category a: The breast tissue is almost entirely
fatty.

CLINICAL DATA: Screening.

EXAM:
DIGITAL SCREENING BILATERAL MAMMOGRAM WITH TOMOSYNTHESIS AND CAD
TECHNIQUE: Bilateral screening digital craniocaudal and mediolateral oblique
mammograms were obtained. Bilateral screening digital breast
tomosynthesis was performed. The images were evaluated with
computer-aided detection.

[L MLO synth-2D (1 of 2)]
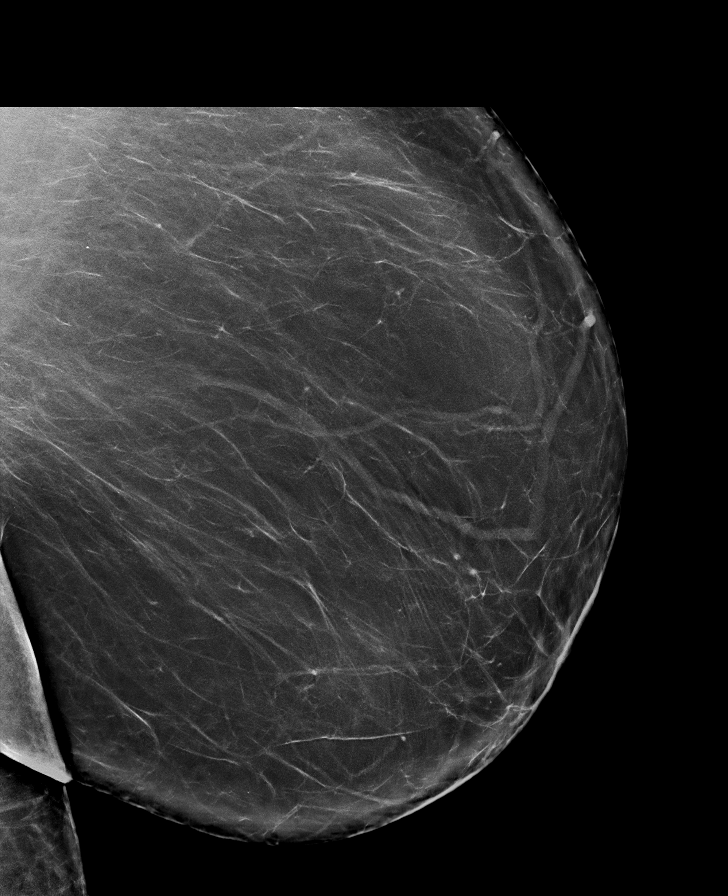

[L MLO synth-2D (2 of 2)]
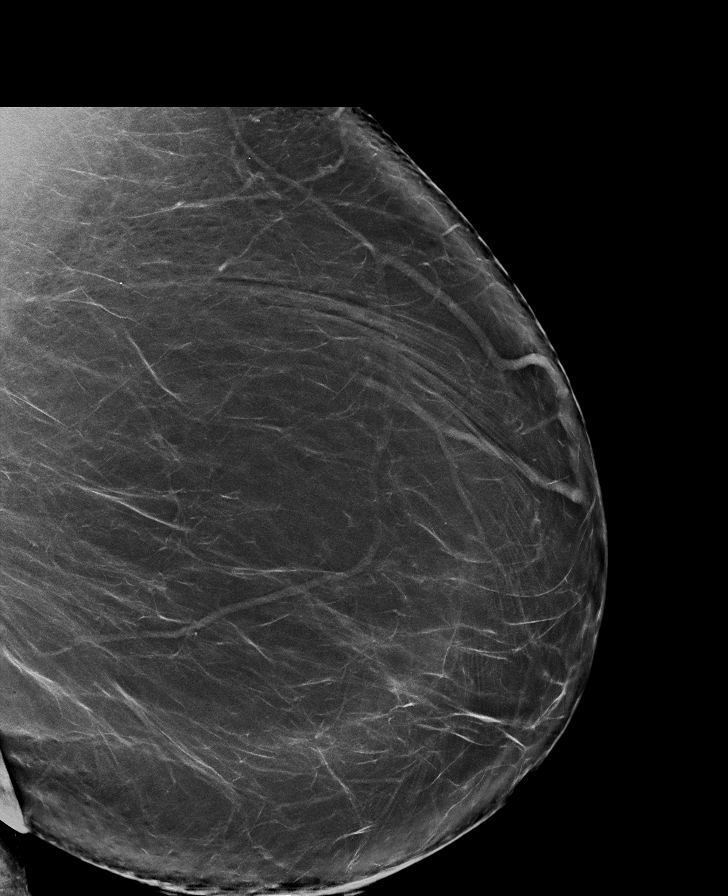

[R CC synth-2D (1 of 2)]
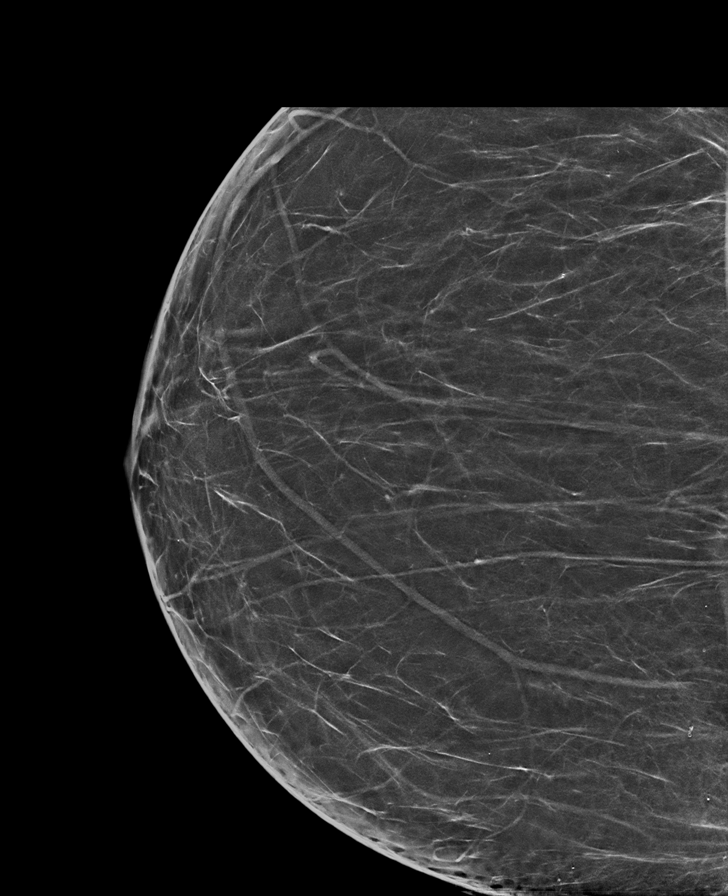

[R CC synth-2D (2 of 2)]
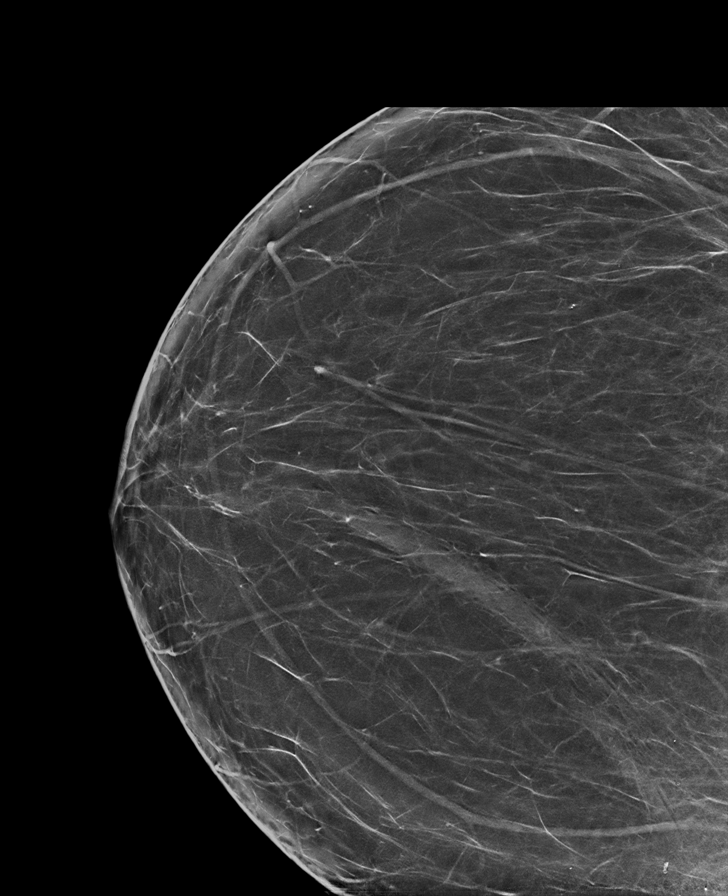

[R MLO synth-2D]
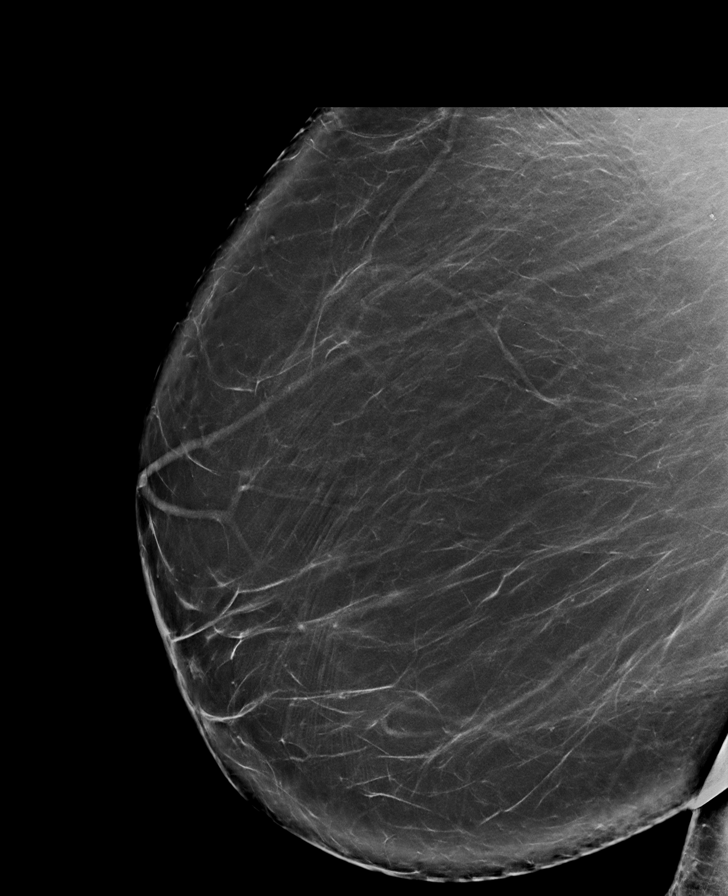

[L CC synth-2D (1 of 2)]
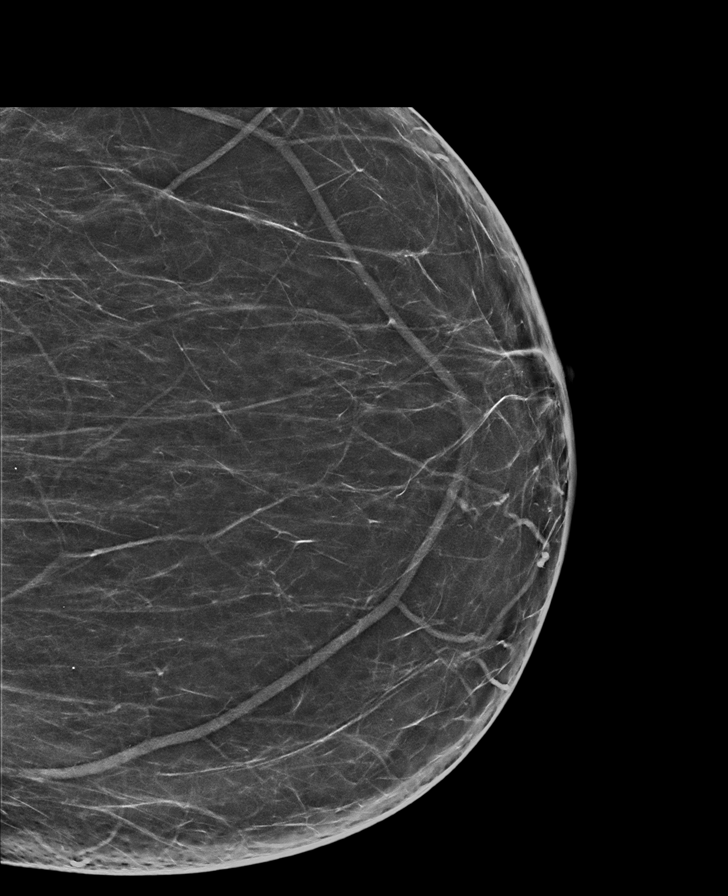

[L CC synth-2D (2 of 2)]
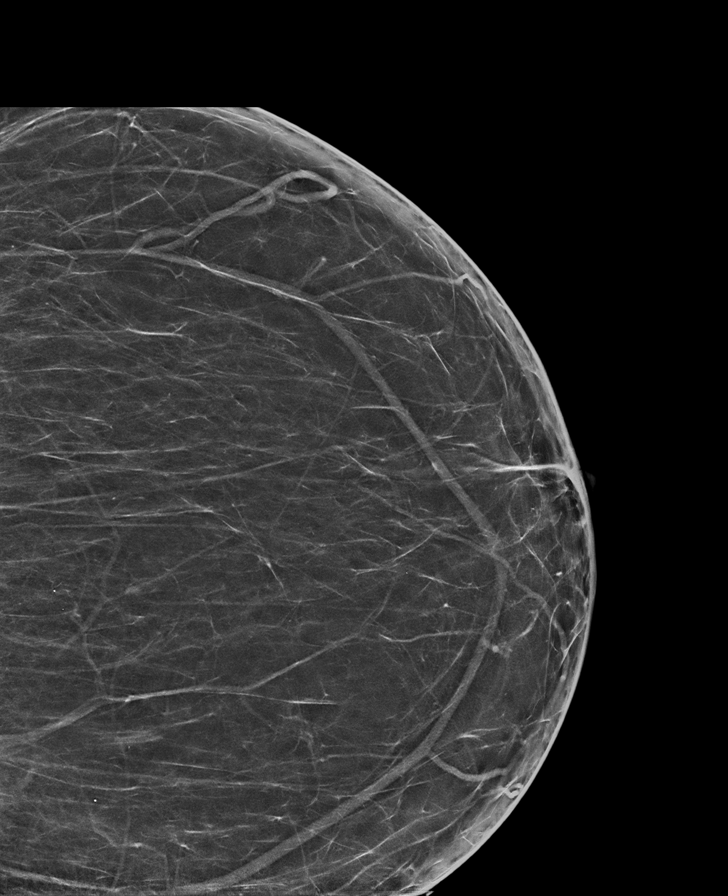

[R MLO tomo · tomo slice 77/113.0]
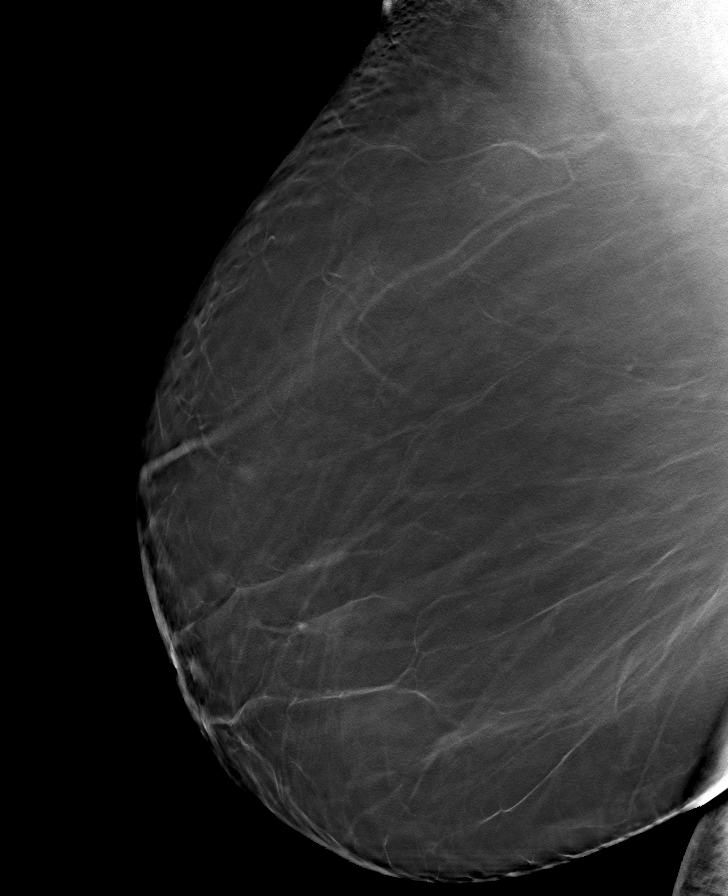

[8 of 40 positions shown; findings below may reference images not displayed]

FINDINGS: There are no findings suspicious for malignancy.
IMPRESSION: No mammographic evidence of malignancy. A result letter of this
screening mammogram will be mailed directly to the patient.

RECOMMENDATION:
Screening mammogram in one year. (Code:0E-3-N98)

BI-RADS CATEGORY  1: Negative.

## 2023-07-29 ENCOUNTER — Encounter: Payer: Self-pay | Admitting: Internal Medicine

## 2023-07-29 NOTE — Telephone Encounter (Signed)
 Care team updated and letter sent for eye exam notes.

## 2023-08-05 ENCOUNTER — Other Ambulatory Visit (INDEPENDENT_AMBULATORY_CARE_PROVIDER_SITE_OTHER): Payer: Self-pay | Admitting: Adult Health

## 2023-08-05 DIAGNOSIS — E559 Vitamin D deficiency, unspecified: Secondary | ICD-10-CM

## 2023-08-25 ENCOUNTER — Ambulatory Visit (INDEPENDENT_AMBULATORY_CARE_PROVIDER_SITE_OTHER): Payer: 59 | Admitting: Family Medicine

## 2023-08-25 ENCOUNTER — Other Ambulatory Visit (HOSPITAL_COMMUNITY): Payer: Self-pay

## 2023-08-25 ENCOUNTER — Encounter (INDEPENDENT_AMBULATORY_CARE_PROVIDER_SITE_OTHER): Payer: Self-pay | Admitting: Family Medicine

## 2023-08-25 VITALS — BP 128/81 | HR 83 | Temp 98.4°F | Ht 66.0 in | Wt 236.0 lb

## 2023-08-25 DIAGNOSIS — Z7985 Long-term (current) use of injectable non-insulin antidiabetic drugs: Secondary | ICD-10-CM

## 2023-08-25 DIAGNOSIS — E559 Vitamin D deficiency, unspecified: Secondary | ICD-10-CM | POA: Diagnosis not present

## 2023-08-25 DIAGNOSIS — Z6838 Body mass index (BMI) 38.0-38.9, adult: Secondary | ICD-10-CM

## 2023-08-25 DIAGNOSIS — E1169 Type 2 diabetes mellitus with other specified complication: Secondary | ICD-10-CM

## 2023-08-25 MED ORDER — TIRZEPATIDE 10 MG/0.5ML ~~LOC~~ SOAJ
10.0000 mg | SUBCUTANEOUS | 1 refills | Status: DC
  Filled 2023-08-25: qty 2, 28d supply, fill #0
  Filled 2023-09-18 – 2023-09-19 (×2): qty 2, 28d supply, fill #1

## 2023-08-25 NOTE — Progress Notes (Signed)
 Terri DOROTHA [name], D.O.  ABFM, ABOM Specializing in Clinical Bariatric Medicine  Office located at: [address] [place], [place]  [address]   Assessment and Plan:   FOR THE DISEASE OF OBESITY: BMI 38.0-38.9,adult - current BMI 38.11 Morbid obesity (HCC)-start bmi 41.16/date 08/22/21 Assessment & Plan: Since last office visit on 07/16/23 patient's  Muscle mass has increased by 2 lb. Fat mass has decreased by 2.2 lb. Total body water has decreased by 3.4 lb.  Counseling done on how various foods will affect these numbers and how to maximize success  Total lbs lost to date: 19 lbs  Total weight loss percentage to date: 7.45%    Recommended Dietary Goals Terri [name] is currently in the action stage of change. As such, her goal is to continue weight management plan.  She has agreed to: continue current plan   Behavioral Intervention We discussed the following today: continue to work on maintaining a reduced calorie state, getting the recommended amount of protein, incorporating whole foods, making healthy choices, staying well hydrated and practicing mindfulness when eating.  Additional resources provided today:  handout on food journaling log   Evidence-based interventions for health behavior change were utilized today including the discussion of self monitoring techniques, problem-solving barriers and SMART goal setting techniques.   Regarding patient's less desirable eating habits and patterns, we employed the technique of small changes.   Pt's goal is to be down 5 lbs in fat by next OV   Recommended Physical Activity Goals Trachelle has been advised to work up to 150 minutes of moderate intensity aerobic activity a week and strengthening exercises 2-3 times per week for cardiovascular health, weight loss maintenance and preservation of muscle mass.   She has agreed to : Continue current level of physical activity    Pharmacotherapy We both agreed to : continue with  nutritional and behavioral strategies and continue current anti-obesity medication regimen   FOR ASSOCIATED CONDITIONS ADDRESSED TODAY:  Type 2 diabetes mellitus with obesity [facility]) Assessment & Plan: Lab Results  Component Value Date   HGBA1C 6.0 (H) 07/16/2023   HGBA1C 6.2 (H) 02/09/2023   HGBA1C 6.0 08/18/2022    Relevant medications: Mounjaro  10 mg once weekly and Metformin  500 mg bid. Denies GI side effects. Her insurance denied our request to increase Mounjaro  to 12.5 mg.  Hunger and cravings are stable. Most recent Hemoglobin A1c above. Improvement from 6.2 to 6.0. Continue all meds at current doses. Continue weight loss therapy via journaling plan.   Orders: -     Tirzepatide ; Inject 10 mg into the skin once a week.  Dispense: 2 mL; Refill: 1   Vitamin D  deficiency Assessment & Plan: Lab Results  Component Value Date   VD25OH 42.9 07/16/2023   VD25OH 36.5 05/01/2023   VD25OH 34.3 02/09/2023   Most recent vitamin D  above. She is on ERGO 50,000 units once every 10 days - occasionally missed a dose. Vitamin D  has improved from 36.5 to 42.9. Goal 50-70. Continue supplement at current dose and continue weight loss efforts.    Follow up:   Return 09/23/2023. She was informed of the importance of frequent follow up visits to maximize her success with intensive lifestyle modifications for her multiple health conditions.  Subjective:   Chief complaint: Obesity Terri [name] is here to discuss her progress with her obesity treatment plan. She is keeping a food journal and adhering to recommended goals of 1300-1400 calories and 100+ protein and states she is following her  eating plan approximately 90% of the time. She states she is using the treadmill/bike 60 minutes, 3 days a week.   Interval History:  Terri [name] is here for a follow up office visit. Since last OV on 07/16/23, Terri [name] is up 1 lb. She overindulged on a few occasions over the holiday period, but overall averaged  1300 calories and 80+ grams of protein daily. She admits that it's difficult to get in all her food, but she manages to by the end of the day. Hunger and cravings are stable.   Pharmacotherapy for weight loss: She is currently taking  Mounjaro  10 mg weekly & Metformin  500 mg bid .   Review of Systems:  Pertinent positives were addressed with patient today.  Reviewed by clinician on day of visit: allergies, medications, problem list, medical history, surgical history, family history, social history, and previous encounter notes.  Weight Summary and Biometrics   Weight Lost Since Last Visit: 0lb  Weight Gained Since Last Visit: 1lb   Vitals Temp: 98.4 F (36.9 C) BP: 128/81 Pulse Rate: 83 SpO2: 100 %   Anthropometric Measurements Height: 5' 6 (1.676 m) Weight: 236 lb (107 kg) BMI (Calculated): 38.11 Weight at Last Visit: 235lb Weight Lost Since Last Visit: 0lb Weight Gained Since Last Visit: 1lb Starting Weight: 255lb Total Weight Loss (lbs): 19 lb (8.618 kg) Peak Weight: 260lb   Body Composition  Body Fat %: 49.4 % Fat Mass (lbs): 116.6 lbs Muscle Mass (lbs): 113.6 lbs Total Body Water (lbs): 89.8 lbs Visceral Fat Rating : 15   Other Clinical Data Fasting: no Labs: no Today's Visit #: 29 Starting Date: 08/22/21   Objective:   PHYSICAL EXAM: Blood pressure 128/81, pulse 83, temperature 98.4 F (36.9 C), height 5' 6 (1.676 m), weight 236 lb (107 kg), SpO2 100%. Body mass index is 38.09 kg/m.  General: she is overweight, cooperative and in no acute distress. PSYCH: Has normal mood, affect and thought process.   HEENT: EOMI, sclerae are anicteric. Lungs: Normal breathing effort, no conversational dyspnea. Extremities: Moves * 4 Neurologic: A and O * 3, good insight  DIAGNOSTIC DATA REVIEWED: BMET    Component Value Date/Time   NA 139 05/01/2023 1602   K 4.4 05/01/2023 1602   CL 100 05/01/2023 1602   CO2 24 05/01/2023 1602   GLUCOSE 90 05/01/2023  1602   GLUCOSE 125 (H) 11/28/2017 2010   BUN 16 05/01/2023 1602   CREATININE 0.63 05/01/2023 1602   CALCIUM  9.7 05/01/2023 1602   GFRNONAA >60 11/28/2017 2010   GFRAA >60 11/28/2017 2010   Lab Results  Component Value Date   HGBA1C 6.0 (H) 07/16/2023   HGBA1C 7.4 (H) 02/28/2021   No results found for: INSULIN  Lab Results  Component Value Date   TSH 1.140 05/01/2023   CBC    Component Value Date/Time   WBC 4.7 05/01/2023 1602   WBC 6.6 11/28/2017 2010   RBC 4.01 05/01/2023 1602   RBC 4.31 11/28/2017 2010   HGB 11.8 05/01/2023 1602   HCT 37.2 05/01/2023 1602   PLT 225 05/01/2023 1602   MCV 93 05/01/2023 1602   MCH 29.4 05/01/2023 1602   MCH 29.0 11/28/2017 2010   MCHC 31.7 05/01/2023 1602   MCHC 30.9 11/28/2017 2010   RDW 12.9 05/01/2023 1602   Iron Studies No results found for: IRON, TIBC, FERRITIN, IRONPCTSAT Lipid Panel     Component Value Date/Time   CHOL 195 05/01/2023 1602   TRIG 136 05/01/2023 1602  HDL 61 05/01/2023 1602   CHOLHDL 3.2 05/01/2023 1602   LDLCALC 110 (H) 05/01/2023 1602   Hepatic Function Panel     Component Value Date/Time   PROT 7.5 05/01/2023 1602   ALBUMIN 4.5 05/01/2023 1602   AST 24 05/01/2023 1602   ALT 24 05/01/2023 1602   ALKPHOS 49 05/01/2023 1602   BILITOT 0.2 05/01/2023 1602   BILIDIR 0.2 03/24/2016 0650   IBILI 0.5 03/24/2016 0650      Component Value Date/Time   TSH 1.140 05/01/2023 1602   Nutritional Lab Results  Component Value Date   VD25OH 42.9 07/16/2023   VD25OH 36.5 05/01/2023   VD25OH 34.3 02/09/2023    Attestations:   I, Special Puri, acting as a stage manager for [employer], DO., have compiled all relevant documentation for today's office visit on behalf of Terri Jenkins, DO, while in the presence of [employer], DO.  I have reviewed the above documentation for accuracy and completeness, and I agree with the above. Terri [name], D.O.  The 21st Century Cures Act was  signed into law in 2016 which includes the topic of electronic health records.  This provides immediate access to information in MyChart.  This includes consultation notes, operative notes, office notes, lab results and pathology reports.  If you have any questions about what you read please let us  know at your next visit so we can discuss your concerns and take corrective action if need be.  We are right here with you.

## 2023-09-05 ENCOUNTER — Ambulatory Visit
Admission: EM | Admit: 2023-09-05 | Discharge: 2023-09-05 | Disposition: A | Discharge status: Home / Self Care | Payer: 59 | Attending: Family Medicine | Admitting: Family Medicine

## 2023-09-05 DIAGNOSIS — R21 Rash and other nonspecific skin eruption: Secondary | ICD-10-CM

## 2023-09-05 MED ORDER — METHYLPREDNISOLONE SODIUM SUCC 125 MG IJ SOLR
80.0000 mg | Freq: Once | INTRAMUSCULAR | Status: AC
  Administered 2023-09-05: 80 mg via INTRAMUSCULAR

## 2023-09-05 NOTE — ED Triage Notes (Signed)
Pt reports she has a rash on her right arm and chest x 1 week. States they itchy and "prickly"   Used triamcinolone cream but no relief.

## 2023-09-05 NOTE — ED Provider Notes (Signed)
[facility]    CSN: [id number] Arrival date & time: 09/05/23  0810      History   Chief Complaint No chief complaint on file.   HPI Terri [name] is a 58 y.o. female.   Patient presenting today with an itchy rash to chest and bilateral upper arms for the past week.  States it is slightly improving but still very bothersome.  Has been trying ketoconazole, hydrocortisone cream with no relief.  No new products or foods, new medications or other exposures.  Denies throat itching or swelling, chest tightness, fever, nausea, vomiting, diarrhea.    Past Medical History:  Diagnosis Date   Arthritis    right hip and knee pain; had right ankle fusion   Back pain    Diabetes mellitus without complication (HCC)    Edema, lower extremity    Hip pain    Hyperlipidemia    Hypertension    Joint pain    Knee pain     Patient Active Problem List   Diagnosis Date Noted   It band syndrome, right 05/01/2023   Need for influenza vaccination 05/01/2023   Skin lesion of neck 01/23/2023   Morbid obesity (HCC) 09/08/2022   current BMI 38.5 09/08/2022   Type 2 diabetes mellitus with obesity (HCC) 07/28/2022   Hypertension, essential 07/07/2022   H/O total hysterectomy 05/06/2022   Vitamin D deficiency 01/21/2022   Arthritis 07/13/2021   Class 3 severe obesity with serious comorbidity and body mass index (BMI) of 40.0 to 44.9 in adult [facility]) 07/12/2021   Encounter for well adult exam with abnormal findings 02/14/2021   HLD (hyperlipidemia) 02/14/2021   Diabetes mellitus (HCC) 03/20/2016   Hypertension associated with type 2 diabetes mellitus (HCC)     Past Surgical History:  Procedure Laterality Date   ANKLE FUSION Right    APPENDECTOMY     CHOLECYSTECTOMY N/A 03/21/2016   Procedure: LAPAROSCOPIC CHOLECYSTECTOMY;  Surgeon: Franky Macho, MD;  Location: [facility];  Service: General;  Laterality: N/A;   TOTAL ABDOMINAL HYSTERECTOMY      OB History     Gravida  3    Para  3   Term      Preterm      AB      Living         SAB      IAB      Ectopic      Multiple      Live Births               Home Medications    Prior to Admission medications   Medication Sig Start Date End Date Taking? Authorizing Provider  amLODipine (NORVASC) 5 MG tablet TAKE 1 TABLET DAILY 03/24/23   [name], MD  ketoconazole 2%-triamcinolone 0.1% 1:2 cream mixture Apply topically daily as needed for rash. 01/23/23   [name], MD  metFORMIN (GLUCOPHAGE) 500 MG tablet Take 1 tablet (500 mg total) by mouth 2 (two) times daily with a meal. 02/17/23   [name], MD  [facility] ULTRA test strip TEST 3 TIMES A DAY 06/02/22   [name], MD  rosuvastatin (CRESTOR) 10 MG tablet Take 2 tablets (20 mg total) by mouth daily. 05/06/23   [name], MD  tirzepatide [facility]) 10 MG/0.5ML Pen Inject 10 mg into the skin once a week. 08/25/23   Opalski, Gavin Pound, DO  valsartan-hydrochlorothiazide (DIOVAN-HCT) 320-25 MG tablet Take 1 tablet by mouth daily. 01/23/23  [name], MD  Vitamin D, Ergocalciferol, (DRISDOL) 1.25 MG (50000 UNIT) CAPS capsule Take 1 capsule by mouth every 10 days 07/16/23   Thomasene Lot, DO    Family History Family History  Problem Relation Age of Onset   Diabetes Mother    High blood pressure Mother    Stroke Mother    Obesity Mother    Sudden death Father     Social History Social History   Tobacco Use   Smoking status: Never   Smokeless tobacco: Never  Vaping Use   Vaping status: Never Used  Substance Use Topics   Alcohol use: No   Drug use: No     Allergies   Penicillins   Review of Systems Review of Systems Per HPI  Physical Exam Triage Vital Signs ED Triage Vitals  Encounter Vitals Group     BP 09/05/23 0821 129/79     Systolic BP Percentile --      Diastolic BP Percentile --      Pulse Rate 09/05/23 0821 84     Resp 09/05/23 0821 20     Temp 09/05/23 0821 98.2 F (36.8 C)      Temp Source 09/05/23 0821 Oral     SpO2 09/05/23 0821 96 %     Weight --      Height --      Head Circumference --      Peak Flow --      Pain Score 09/05/23 0819 0     Pain Loc --      Pain Education --      Exclude from Growth Chart --    No data found.  Updated Vital Signs BP 129/79 (BP Location: Right Arm)   Pulse 84   Temp 98.2 F (36.8 C) (Oral)   Resp 20   SpO2 96%   Visual Acuity Right Eye Distance:   Left Eye Distance:   Bilateral Distance:    Right Eye Near:   Left Eye Near:    Bilateral Near:     Physical Exam Vitals and nursing note reviewed.  Constitutional:      Appearance: Normal appearance. She is not ill-appearing.  HENT:     Head: Atraumatic.  Eyes:     Extraocular Movements: Extraocular movements intact.     Conjunctiva/sclera: Conjunctivae normal.  Cardiovascular:     Rate and Rhythm: Normal rate and regular rhythm.     Heart sounds: Normal heart sounds.  Pulmonary:     Effort: Pulmonary effort is normal.     Breath sounds: Normal breath sounds.  Musculoskeletal:        General: Normal range of motion.     Cervical back: Normal range of motion and neck supple.  Skin:    General: Skin is warm and dry.     Findings: Rash present.     Comments: Minimally hyperpigmented macular patches very faintly visible to upper arms, chest sporadically  Neurological:     Mental Status: She is alert and oriented to person, place, and time.  Psychiatric:        Mood and Affect: Mood normal.        Thought Content: Thought content normal.        Judgment: Judgment normal.      UC Treatments / Results  Labs (all labs ordered are listed, but only abnormal results are displayed) Labs Reviewed - No data to display  EKG   Radiology No results found.  Procedures Procedures (  including critical care time)  Medications Ordered in UC Medications  methylPREDNISolone sodium succinate (SOLU-MEDROL) 125 mg/2 mL injection 80 mg (80 mg Intramuscular  Given 09/05/23 0853)    Initial Impression / Assessment and Plan / UC Course  I have reviewed the triage vital signs and the nursing notes.  Pertinent labs & imaging results that were available during my care of the patient were reviewed by me and considered in my medical decision making (see chart for details).     Very vague appearing rash, unclear etiology.  May continue the topicals that she is trying and try IM Solu-Medrol, Zyrtec.  Return precautions reviewed.  Final Clinical Impressions(s) / UC Diagnoses   Final diagnoses:  Rash and nonspecific skin eruption     Discharge Instructions      We have given you a steroid shot today to hopefully resolve the rash.  You may also take antihistamines such as Zyrtec daily and use the topical creams.  Avoid scented or irritating products.  Follow-up if not resolving.    ED Prescriptions   None    PDMP not reviewed this encounter.   Roosvelt Maser Simpson, [place] 09/05/23 [phone]

## 2023-09-05 NOTE — Discharge Instructions (Signed)
We have given you a steroid shot today to hopefully resolve the rash.  You may also take antihistamines such as Zyrtec daily and use the topical creams.  Avoid scented or irritating products.  Follow-up if not resolving.

## 2023-09-15 ENCOUNTER — Ambulatory Visit
Admission: EM | Admit: 2023-09-15 | Discharge: 2023-09-15 | Disposition: A | Discharge status: Home / Self Care | Payer: 59 | Attending: Family Medicine | Admitting: Family Medicine

## 2023-09-15 DIAGNOSIS — R21 Rash and other nonspecific skin eruption: Secondary | ICD-10-CM | POA: Diagnosis not present

## 2023-09-15 MED ORDER — PREDNISONE 10 MG (48) PO TBPK: ORAL_TABLET | ORAL | 0 refills | Status: DC

## 2023-09-15 NOTE — ED Triage Notes (Signed)
Pt reports her rash from the previous visit is not clearing up, in fact it is spreading. States it is still on her arms, chest, and face

## 2023-09-15 NOTE — ED Provider Notes (Signed)
 [facility]   [id number] 09/15/23 Arrival Time: 1635  ASSESSMENT & PLAN:  1. Rash and nonspecific skin eruption    Unclear trigger. Did see some relief with IM solu-medrol  at last visit. No signs of skin infection. Trial of: Meds ordered this encounter  Medications   predniSONE  (STERAPRED UNI-PAK 48 TAB) 10 MG (48) TBPK tablet    Sig: Take as directed.    Dispense:  48 tablet    Refill:  0     Follow-up Information     Melvenia Manus BRAVO, MD.   Specialty: Internal Medicine Why: If worsening or failing to improve as anticipated. Contact information: [address] [place] [address] [phone]                 Reviewed expectations re: course of current medical issues. Questions answered. Outlined signs and symptoms indicating need for more acute intervention. Understanding verbalized. After Visit Summary given.   SUBJECTIVE: History from: Patient. Terri [name] is a 58 y.o. female. Pt reports her rash from the previous visit is not clearing up, in fact it is spreading. States it is still on her arms, chest, and face  Denies: fever. Normal PO intake without n/v/d.  OBJECTIVE:  Vitals:   09/15/23 1753  Pulse: 91  Resp: 16  Temp: 97.9 F (36.6 C)  TempSrc: Oral  SpO2: 96%    General appearance: alert; no distress Extremities: no edema Skin: warm and dry; maculopapular skin colored/slightly erythematous rash on upper chest and upper extremities; spares back Psychological: alert and cooperative; normal mood and affect  Allergies  Allergen Reactions   Penicillins     Develops yeast infections when on medication    Past Medical History:  Diagnosis Date   Arthritis    right hip and knee pain; had right ankle fusion   Back pain    Diabetes mellitus without complication (HCC)    Edema, lower extremity    Hip pain    Hyperlipidemia    Hypertension    Joint pain    Knee pain    Social History   Socioeconomic History    Marital status: Divorced    Spouse name: Not on file   Number of children: 3   Years of education: Not on file   Highest education level: Master's degree (e.g., MA, MS, MEng, MEd, MSW, MBA)  Occupational History   Occupation: [employer]    Comment: EC Pre-K runner, broadcasting/film/video   Occupation: Pension Scheme Manager  Tobacco Use   Smoking status: Never   Smokeless tobacco: Never  Vaping Use   Vaping status: Never Used  Substance and Sexual Activity   Alcohol use: No   Drug use: No   Sexual activity: Yes    Birth control/protection: Surgical  Other Topics Concern   Not on file  Social History Narrative   3 adult children; youngest is out of town   Social Drivers of Corporate Investment Banker Strain: Low Risk  (01/19/2023)   Overall Financial Resource Strain (CARDIA)    Difficulty of Paying Living Expenses: Not very hard  Food Insecurity: No Food Insecurity (01/19/2023)   Hunger Vital Sign    Worried About Running Out of Food in the Last Year: Never true    Ran Out of Food in the Last Year: Never true  Transportation Needs: No Transportation Needs (01/19/2023)   PRAPARE - Administrator, Civil Service (Medical): No    Lack of Transportation (  Non-Medical): No  Physical Activity: Insufficiently Active (01/19/2023)   Exercise Vital Sign    Days of Exercise per Week: 3 days    Minutes of Exercise per Session: 30 min  Stress: No Stress Concern Present (01/19/2023)   [employer] of Occupational Health - Occupational Stress Questionnaire    Feeling of Stress : Not at all  Social Connections: Moderately Integrated (01/19/2023)   Social Connection and Isolation Panel [NHANES]    Frequency of Communication with Friends and Family: More than three times a week    Frequency of Social Gatherings with Friends and Family: Once a week    Attends Religious Services: More than 4 times per year    Active Member of [employer] or Organizations: Yes    Attends Tax Inspector Meetings: Not on file    Marital Status: Divorced  Intimate Partner Violence: Not on file   Family History  Problem Relation Age of Onset   Diabetes Mother    High blood pressure Mother    Stroke Mother    Obesity Mother    Sudden death Father    Past Surgical History:  Procedure Laterality Date   ANKLE FUSION Right    APPENDECTOMY     CHOLECYSTECTOMY N/A 03/21/2016   Procedure: LAPAROSCOPIC CHOLECYSTECTOMY;  Surgeon: Oneil Budge, MD;  Location: [facility];  Service: General;  Laterality: N/A;   TOTAL ABDOMINAL WANNA       Rolinda Rogue, MD 09/15/23 [phone]

## 2023-09-19 ENCOUNTER — Other Ambulatory Visit (HOSPITAL_COMMUNITY): Payer: Self-pay

## 2023-09-23 ENCOUNTER — Ambulatory Visit (INDEPENDENT_AMBULATORY_CARE_PROVIDER_SITE_OTHER): Payer: 59 | Admitting: Family Medicine

## 2023-10-14 ENCOUNTER — Other Ambulatory Visit (HOSPITAL_COMMUNITY): Payer: Self-pay

## 2023-10-14 ENCOUNTER — Encounter (INDEPENDENT_AMBULATORY_CARE_PROVIDER_SITE_OTHER): Payer: Self-pay | Admitting: Family Medicine

## 2023-10-14 ENCOUNTER — Ambulatory Visit (INDEPENDENT_AMBULATORY_CARE_PROVIDER_SITE_OTHER): Payer: 59 | Admitting: Family Medicine

## 2023-10-14 VITALS — BP 113/70 | HR 81 | Temp 98.7°F | Ht 66.0 in | Wt 236.0 lb

## 2023-10-14 DIAGNOSIS — E559 Vitamin D deficiency, unspecified: Secondary | ICD-10-CM | POA: Diagnosis not present

## 2023-10-14 DIAGNOSIS — Z6838 Body mass index (BMI) 38.0-38.9, adult: Secondary | ICD-10-CM

## 2023-10-14 DIAGNOSIS — E1169 Type 2 diabetes mellitus with other specified complication: Secondary | ICD-10-CM

## 2023-10-14 DIAGNOSIS — K5909 Other constipation: Secondary | ICD-10-CM

## 2023-10-14 DIAGNOSIS — Z7985 Long-term (current) use of injectable non-insulin antidiabetic drugs: Secondary | ICD-10-CM

## 2023-10-14 MED ORDER — VITAMIN D (ERGOCALCIFEROL) 1.25 MG (50000 UNIT) PO CAPS
50000.0000 [IU] | ORAL_CAPSULE | ORAL | 0 refills | Status: DC
  Filled 2023-10-14: qty 6, 60d supply, fill #0

## 2023-10-14 MED ORDER — TIRZEPATIDE 10 MG/0.5ML ~~LOC~~ SOAJ
10.0000 mg | SUBCUTANEOUS | 1 refills | Status: DC
  Filled 2023-10-14: qty 2, 28d supply, fill #0
  Filled 2023-11-13 – 2023-11-14 (×2): qty 2, 28d supply, fill #1

## 2023-10-14 MED ORDER — POLYETHYLENE GLYCOL 3350 17 GM/SCOOP PO POWD: 17.0000 g | Freq: Every day | ORAL | Status: DC

## 2023-10-14 NOTE — Progress Notes (Signed)
 Terri [name], D.O.  ABFM, ABOM Specializing in Clinical Bariatric Medicine  Office located at: [address] [place], [place]  [address]   Assessment and Plan:   FOR THE DISEASE OF OBESITY:  BMI 38.0-38.9,adult - current BMI 38.11 Morbid obesity (HCC)-start bmi 41.16/date 08/22/21 Assessment & Plan: Since last office visit on 08/25/2023 patient's  Muscle mass has decreased by 0.6 lb. Fat mass has increased by 0.6 lb. Total body water has increased by 3.2 lb.  Counseling done on how various foods will affect these numbers and how to maximize success  Total lbs lost to date: 19 lbs  Total weight loss percentage to date: 7.45%    Recommended Dietary Goals Keondra is currently in the action stage of change. As such, her goal is to continue weight management plan.  She has agreed to: continue current plan - pt okayed to have 1000-1100 calories and 90+ grams protein on days she does not exercise.    Behavioral Intervention We discussed the following today: increasing lean protein intake to established goals, work on managing stress, creating time for self-care and relaxation, and focusing on food with a 10:1 ratio of calories: grams of protein  Additional resources provided today: None  Evidence-based interventions for health behavior change were utilized today including the discussion of self monitoring techniques, problem-solving barriers and SMART goal setting techniques.   Regarding patient's less desirable eating habits and patterns, we employed the technique of small changes.   Pt will specifically work on: n/a   Recommended Physical Activity Goals Giani has been advised to work up to 150 minutes of moderate intensity aerobic activity a week and strengthening exercises 2-3 times per week for cardiovascular health, weight loss maintenance and preservation of muscle mass.   She has agreed to : continue to gradually increase the amount and intensity of exercise  routine   Pharmacotherapy We both agreed to : continue with nutritional and behavioral strategies and continue with medications at current doses.    FOR ASSOCIATED CONDITIONS ADDRESSED TODAY:  Type 2 diabetes mellitus with obesity (HCC) Assessment & Plan: Pt is on a regimen of Mounjaro 10 mg weekly & Metformin 500 mg twice daily. Her insurance denied our request to increase Mounjaro to 12.5 mg in the past. No c/o hunger & cravings. Continue current medicines and balanced diet focusing on protein, fruits, and vegetables while limiting simple carbohydrates. Losing 10% or more of body weight may improve condition.  Relevant Orders:   -     Tirzepatide; Inject 10 mg into the skin once a week.  Dispense: 2 mL; Refill: 1   Other constipation Assessment & Plan: Experiencing constipation for the past 2 weeks - could be related to GLP-1, stress, or her exercising less. Start Miralax - take daily until stooling regularly and then prn. Discussed adequate hydration. Increase exercise as able.   Relevant Orders:  -    Polyethylene Glycol 3350; Take 17 g by mouth daily.   Vitamin D deficiency Assessment & Plan: Pt is on prescription high dose vitamin D every 10 days without any complications. Continue vitamin D supplementation - recheck levels in the future.  Relevant Orders:  -     Vitamin D (Ergocalciferol); Take 1 capsule by mouth every 10 days  Dispense: 6 capsule; Refill: 0   Follow up:   Return 11/18/2023. She was informed of the importance of frequent follow up visits to maximize her success with intensive lifestyle modifications for her multiple health conditions.  Subjective:   Chief complaint: Obesity Lyzbeth is here to discuss her progress with her obesity treatment plan. She is keeping a food journal and adhering to recommended goals of 1300-1400 calories and 100+ protein and states she is following her eating plan approximately 80% of the time. She states she is walking 30-45  minutes 3 days per week.  Interval History:  BRETTNEY FICKEN is here for a follow up office visit. Since last OV on 08/25/2023, Jaeleigh's weight has not changed. Pt is exercising less d/t  work Counselling psychologist. Overall, she endorses that work has been more stressful. She denies skipping meals. Journaling wise, she averages 1000-1100 calories per day. She hit her protein goals half the time. Drinks 90+ ounces of water daily.   Pharmacotherapy for weight loss: She is currently taking  Mounjaro 10 mg weekly & Metformin 500 mg two times daily .   Review of Systems:  Pertinent positives were addressed with patient today.  Reviewed by clinician on day of visit: allergies, medications, problem list, medical history, surgical history, family history, social history, and previous encounter notes.  Weight Summary and Biometrics   Weight Lost Since Last Visit: 0  Weight Gained Since Last Visit: 0   Vitals Temp: 98.7 F (37.1 C) BP: 113/70 Pulse Rate: 81 SpO2: 100 %   Anthropometric Measurements Height: 5\' 6"  (1.676 m) Weight: 236 lb (107 kg) BMI (Calculated): 38.11 Weight at Last Visit: 236 lb Weight Lost Since Last Visit: 0 Weight Gained Since Last Visit: 0 Starting Weight: 255 lb Total Weight Loss (lbs): 19 lb (8.618 kg) Peak Weight: 260 lb   Body Composition  Body Fat %: 49.6 % Fat Mass (lbs): 117.2 lbs Muscle Mass (lbs): 113 lbs Total Body Water (lbs): 93 lbs Visceral Fat Rating : 15   Other Clinical Data Fasting: No Labs: No Today's Visit #: 30 Starting Date: 08/22/21   Objective:   PHYSICAL EXAM: Blood pressure 113/70, pulse 81, temperature 98.7 F (37.1 C), height 5\' 6"  (1.676 m), weight 236 lb (107 kg), SpO2 100%. Body mass index is 38.09 kg/m.  General: she is overweight, cooperative and in no acute distress. PSYCH: Has normal mood, affect and thought process.   HEENT: EOMI, sclerae are anicteric. Lungs: Normal breathing effort, no conversational  dyspnea. Extremities: Moves * 4 Neurologic: A and O * 3, good insight  DIAGNOSTIC DATA REVIEWED: BMET    Component Value Date/Time   NA 139 05/01/2023 1602   K 4.4 05/01/2023 1602   CL 100 05/01/2023 1602   CO2 24 05/01/2023 1602   GLUCOSE 90 05/01/2023 1602   GLUCOSE 125 (H) 11/28/2017 2010   BUN 16 05/01/2023 1602   CREATININE 0.63 05/01/2023 1602   CALCIUM 9.7 05/01/2023 1602   GFRNONAA >60 11/28/2017 2010   GFRAA >60 11/28/2017 2010   Lab Results  Component Value Date   HGBA1C 6.0 (H) 07/16/2023   HGBA1C 7.4 (H) 02/28/2021   No results found for: "INSULIN" Lab Results  Component Value Date   TSH 1.140 05/01/2023   CBC    Component Value Date/Time   WBC 4.7 05/01/2023 1602   WBC 6.6 11/28/2017 2010   RBC 4.01 05/01/2023 1602   RBC 4.31 11/28/2017 2010   HGB 11.8 05/01/2023 1602   HCT 37.2 05/01/2023 1602   PLT 225 05/01/2023 1602   MCV 93 05/01/2023 1602   MCH 29.4 05/01/2023 1602   MCH 29.0 11/28/2017 2010   MCHC 31.7 05/01/2023 1602   MCHC 30.9 11/28/2017 2010  RDW 12.9 05/01/2023 1602   Iron Studies No results found for: "IRON", "TIBC", "FERRITIN", "IRONPCTSAT" Lipid Panel     Component Value Date/Time   CHOL 195 05/01/2023 1602   TRIG 136 05/01/2023 1602   HDL 61 05/01/2023 1602   CHOLHDL 3.2 05/01/2023 1602   LDLCALC 110 (H) 05/01/2023 1602   Hepatic Function Panel     Component Value Date/Time   PROT 7.5 05/01/2023 1602   ALBUMIN 4.5 05/01/2023 1602   AST 24 05/01/2023 1602   ALT 24 05/01/2023 1602   ALKPHOS 49 05/01/2023 1602   BILITOT 0.2 05/01/2023 1602   BILIDIR 0.2 03/24/2016 0650   IBILI 0.5 03/24/2016 0650      Component Value Date/Time   TSH 1.140 05/01/2023 1602   Nutritional Lab Results  Component Value Date   VD25OH 42.9 07/16/2023   VD25OH 36.5 05/01/2023   VD25OH 34.3 02/09/2023    Attestations:   I, Special Puri, acting as a Stage manager for [employer], DO., have compiled all relevant documentation for  today's office visit on behalf of Thomasene Lot, DO, while in the presence of [employer], DO.  I have reviewed the above documentation for accuracy and completeness, and I agree with the above. Terri [name], D.O.  The 21st Century Cures Act was signed into law in 2016 which includes the topic of electronic health records.  This provides immediate access to information in MyChart.  This includes consultation notes, operative notes, office notes, lab results and pathology reports.  If you have any questions about what you read please let us know at your next visit so we can discuss your concerns and take corrective action if need be.  We are right here with you.

## 2023-10-21 ENCOUNTER — Ambulatory Visit: Admitting: Family Medicine

## 2023-10-21 ENCOUNTER — Telehealth: Payer: Self-pay | Admitting: Internal Medicine

## 2023-10-21 ENCOUNTER — Encounter: Payer: Self-pay | Admitting: Family Medicine

## 2023-10-21 VITALS — BP 126/75 | HR 85 | Ht 66.0 in | Wt 238.0 lb

## 2023-10-21 DIAGNOSIS — L309 Dermatitis, unspecified: Secondary | ICD-10-CM

## 2023-10-21 DIAGNOSIS — L259 Unspecified contact dermatitis, unspecified cause: Secondary | ICD-10-CM

## 2023-10-21 MED ORDER — HYDROXYZINE PAMOATE 25 MG PO CAPS: 25.0000 mg | ORAL_CAPSULE | Freq: Every day | ORAL | 2 refills | Status: DC

## 2023-10-21 MED ORDER — TACROLIMUS 0.1 % EX CREA: TOPICAL_CREAM | CUTANEOUS | 0 refills | Status: DC

## 2023-10-21 NOTE — Progress Notes (Signed)
 Established Patient Office Visit   Subjective  Patient ID: Terri [name], female    DOB: 01/03/1966  Age: 59 y.o. MRN: [id number]  Chief Complaint  Patient presents with   Acute Visit    Itching on chest and arms : has had singles shot  pt. Reports Dry, itchy, bumpy, red rash that responds to prednisone( given at UC twice)  but once the medication course is complete the rash returns worse. It burns in the shower, itches worse at night, and does not respond to OTC creams symptoms have persisted over a month.     She  has a past medical history of Arthritis, Back pain, Diabetes mellitus without complication (HCC), Edema, lower extremity, Hip pain, Hyperlipidemia, Hypertension, Joint pain, and Knee pain.  The patient presents for an acute visit with complaints of itching on the chest and arms. Patient reports experiencing a dry, itchy, bumpy, red rash that has persisted for over a month. This is a recurrent issue, with the episode beginning one month ago and gradually worsening since. The rash is diffuse and characterized by dryness, itchiness, and redness. 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 The symptoms are aggravated by burning during showers and increased itching at night. Over-the-counter creams and previous treatments, including oral steroids (prednisone) and calamine lotion, have not provided relief. Pertinent negatives include no congestion, cough, rhinorrhea, shortness of breath, or vomiting. There is no history of allergies or eczema.    Review of Systems  Respiratory:  Negative for shortness of breath.   Cardiovascular:  Negative for chest pain.  Skin:  Positive for itching and rash.  Neurological:  Negative for dizziness.      Objective:     BP 126/75   Pulse 85   Ht 5\' 6"  (1.676 m)   Wt 238 lb 0.6 oz (108 kg)   SpO2 92%   BMI 38.42 kg/m  BP Readings from Last 3 Encounters:  10/21/23 126/75  10/14/23  113/70  09/05/23 129/79      Physical Exam Vitals reviewed.  Constitutional:      General: She is not in acute distress.    Appearance: Normal appearance. She is not ill-appearing, toxic-appearing or diaphoretic.  HENT:     Head: Normocephalic.  Eyes:     General:        Right eye: No discharge.        Left eye: No discharge.     Conjunctiva/sclera: Conjunctivae normal.  Cardiovascular:     Rate and Rhythm: Normal rate.     Pulses: Normal pulses.     Heart sounds: Normal heart sounds.  Pulmonary:     Effort: Pulmonary effort is normal. No respiratory distress.     Breath sounds: Normal breath sounds.  Musculoskeletal:        General: Normal range of motion.     Cervical back: Normal range of motion.  Skin:    General: Skin is warm and dry.     Capillary Refill: Capillary refill takes less than 2 seconds.     Comments: Diffuse chest and bilateral arms: erythema, dryness, and raised, bumpy lesions.  Neurological:     Mental Status: She is alert.  Psychiatric:        Mood and Affect: Mood normal.        Behavior: Behavior normal.      No results found for any visits on 10/21/23.  The 10-year ASCVD risk score (Arnett DK,  et al., 2019) is: 10.4%    Assessment & Plan:  Dermatitis -     Tacrolimus; Apply a thin layer to affected areas twice daily for 14 days  Dispense: 30 g; Refill: 0 -     Ambulatory referral to Dermatology -     hydrOXYzine Pamoate; Take 1 capsule (25 mg total) by mouth at bedtime.  Dispense: 30 capsule; Refill: 2  Contact dermatitis, unspecified contact dermatitis type, unspecified trigger Assessment & Plan: Trial hydroxyzine 25 mg at bedtime, trial Tacrolimus 0.1 % cream Discussed  apply a thick, fragrance-free moisturizer immediately after washing hands and avoid using harsh soaps or irritants. Wear protective gloves during activities involving water or cleaning products to prevent flare-ups. Advise patient to apply cold , wet cloth or ice pack to  the skin that itches, wear loose-fitting , cotton clothing, use fragrance-free lotions, soaps, and detergents to minimize irritation.      Return if symptoms worsen or fail to improve.   Cruzita Lederer Newman Nip, FNP

## 2023-10-21 NOTE — Patient Instructions (Addendum)
        Great to see you today.  I have refilled the medication(s) we provide.   Please call Dermatology # [phone]

## 2023-10-21 NOTE — Assessment & Plan Note (Signed)
 Trial hydroxyzine 25 mg at bedtime, trial Tacrolimus 0.1 % cream Discussed  apply a thick, fragrance-free moisturizer immediately after washing hands and avoid using harsh soaps or irritants. Wear protective gloves during activities involving water or cleaning products to prevent flare-ups. Advise patient to apply cold , wet cloth or ice pack to the skin that itches, wear loose-fitting , cotton clothing, use fragrance-free lotions, soaps, and detergents to minimize irritation.

## 2023-10-21 NOTE — Telephone Encounter (Signed)
 Copied from CRM [id number]. Topic: Clinical - Prescription Issue >> Oct 21, 2023 12:24 PM Terri [name] wrote: Reason for CRM: Patient received a call from pharmacy stating they need clarification for prescription. Patient unsure which prescription needs clarification, Tacrolimus 0.1 % CREA or hydrOXYzine (VISTARIL) 25 MG capsule

## 2023-10-22 ENCOUNTER — Other Ambulatory Visit: Payer: Self-pay | Admitting: Family Medicine

## 2023-10-22 DIAGNOSIS — L309 Dermatitis, unspecified: Secondary | ICD-10-CM

## 2023-10-22 MED ORDER — TACROLIMUS 0.1 % EX OINT
TOPICAL_OINTMENT | Freq: Two times a day (BID) | CUTANEOUS | 0 refills | Status: DC
Start: 2023-10-22 — End: 2023-11-18

## 2023-10-22 NOTE — Telephone Encounter (Signed)
 sent

## 2023-10-22 NOTE — Telephone Encounter (Signed)
 Patient came by office in regard to   Tacrolimus 0.1 % CREA [[id number]]    Need new script sent in. Original script unable to be read

## 2023-10-29 ENCOUNTER — Ambulatory Visit: Admitting: Internal Medicine

## 2023-11-12 ENCOUNTER — Other Ambulatory Visit: Payer: Self-pay | Admitting: Family Medicine

## 2023-11-12 DIAGNOSIS — L309 Dermatitis, unspecified: Secondary | ICD-10-CM

## 2023-11-14 ENCOUNTER — Other Ambulatory Visit (HOSPITAL_COMMUNITY): Payer: Self-pay

## 2023-11-18 ENCOUNTER — Other Ambulatory Visit: Payer: Self-pay | Admitting: Family Medicine

## 2023-11-18 ENCOUNTER — Ambulatory Visit (INDEPENDENT_AMBULATORY_CARE_PROVIDER_SITE_OTHER): Payer: 59 | Admitting: Family Medicine

## 2023-11-18 ENCOUNTER — Other Ambulatory Visit (HOSPITAL_COMMUNITY): Payer: Self-pay

## 2023-11-18 ENCOUNTER — Telehealth: Payer: Self-pay | Admitting: Pharmacy Technician

## 2023-11-18 NOTE — Telephone Encounter (Signed)
 Pharmacy Patient Advocate Encounter  Received notification from [facility] that Prior Authorization for Tacrolimus 0.1% ointment has been DENIED.  Full denial letter will be uploaded to the media tab. See denial reason below.   PA #/Case ID/Reference #: [id number]

## 2023-11-18 NOTE — Telephone Encounter (Signed)
 Pharmacy Patient Advocate Encounter   Received notification from CoverMyMeds that prior authorization for Tacrolimus 0.1% ointment is required/requested.   Insurance verification completed.   The patient is insured through [facility] .   Per test claim: PA required; PA submitted to above mentioned insurance via CoverMyMeds Key/confirmation #/EOC [id number] Status is pending

## 2023-11-19 NOTE — Telephone Encounter (Signed)
 Copied from CRM [id number]. Topic: Clinical - Prescription Issue >> Nov 19, 2023 12:05 PM Terri [name] wrote: Reason for CRM: Prior authorization for tacrolimus (PROTOPIC) 0.1 % ointment needs to be submitted with diagnosis code

## 2023-11-23 NOTE — Telephone Encounter (Signed)
 Copied from CRM [id number]. Topic: Clinical - Medication Refill >> Nov 23, 2023 12:20 PM Rosaria Common wrote: Most Recent Primary Care Visit:  Provider: Rosanna Comment  Department: [facility]  Visit Type: OFFICE VISIT  Date: 10/21/2023  Medication: tacrolimus (PROTOPIC) 0.1 % ointment  Has the patient contacted their pharmacy? Yes (Agent: If no, request that the patient contact the pharmacy for the refill. If patient does not wish to contact the pharmacy document the reason why and proceed with request.) (Agent: If yes, when and what did the pharmacy advise?)  Is this the correct pharmacy for this prescription? Yes If no, delete pharmacy and type the correct one.  This is the patient's preferred pharmacy:  [id number] [place], [place] - [address] AT [facility] [address] [place] [address] Phone: [phone] Fax: [phone]     Has the prescription been filled recently? Yes  Is the patient out of the medication? Yes  Has the patient been seen for an appointment in the last year OR does the patient have an upcoming appointment? Yes  Can we respond through MyChart? Yes  Agent: Please be advised that Rx refills may take up to 3 business days. We ask that you follow-up with your pharmacy.

## 2023-11-24 ENCOUNTER — Other Ambulatory Visit: Payer: Self-pay | Admitting: Family Medicine

## 2023-12-01 MED ORDER — HYDROXYZINE PAMOATE 25 MG PO CAPS: 25.0000 mg | ORAL_CAPSULE | Freq: Every day | ORAL | 1 refills | Status: DC

## 2023-12-01 NOTE — Addendum Note (Signed)
 Addended by: Geovanni Rahming E on: 12/01/2023 03:14 PM   Modules accepted: Orders

## 2023-12-09 ENCOUNTER — Other Ambulatory Visit (HOSPITAL_COMMUNITY): Payer: Self-pay

## 2023-12-09 ENCOUNTER — Encounter (INDEPENDENT_AMBULATORY_CARE_PROVIDER_SITE_OTHER): Payer: Self-pay | Admitting: Family Medicine

## 2023-12-09 ENCOUNTER — Ambulatory Visit (INDEPENDENT_AMBULATORY_CARE_PROVIDER_SITE_OTHER): Admitting: Family Medicine

## 2023-12-09 VITALS — BP 124/74 | HR 100 | Temp 98.7°F | Ht 66.0 in | Wt 233.0 lb

## 2023-12-09 DIAGNOSIS — E785 Hyperlipidemia, unspecified: Secondary | ICD-10-CM

## 2023-12-09 DIAGNOSIS — Z7985 Long-term (current) use of injectable non-insulin antidiabetic drugs: Secondary | ICD-10-CM

## 2023-12-09 DIAGNOSIS — E119 Type 2 diabetes mellitus without complications: Secondary | ICD-10-CM

## 2023-12-09 DIAGNOSIS — E559 Vitamin D deficiency, unspecified: Secondary | ICD-10-CM

## 2023-12-09 DIAGNOSIS — E1159 Type 2 diabetes mellitus with other circulatory complications: Secondary | ICD-10-CM

## 2023-12-09 DIAGNOSIS — E1169 Type 2 diabetes mellitus with other specified complication: Secondary | ICD-10-CM | POA: Diagnosis not present

## 2023-12-09 DIAGNOSIS — I152 Hypertension secondary to endocrine disorders: Secondary | ICD-10-CM

## 2023-12-09 DIAGNOSIS — E538 Deficiency of other specified B group vitamins: Secondary | ICD-10-CM

## 2023-12-09 DIAGNOSIS — Z6838 Body mass index (BMI) 38.0-38.9, adult: Secondary | ICD-10-CM

## 2023-12-09 DIAGNOSIS — Z6837 Body mass index (BMI) 37.0-37.9, adult: Secondary | ICD-10-CM

## 2023-12-09 MED ORDER — VITAMIN D (ERGOCALCIFEROL) 1.25 MG (50000 UNIT) PO CAPS
50000.0000 [IU] | ORAL_CAPSULE | ORAL | 0 refills | Status: DC
  Filled 2023-12-09: qty 6, 60d supply, fill #0

## 2023-12-09 MED ORDER — CYANOCOBALAMIN 500 MCG PO TABS: 500.0000 ug | ORAL_TABLET | Freq: Every day | ORAL | Status: DC

## 2023-12-09 MED ORDER — TIRZEPATIDE 10 MG/0.5ML ~~LOC~~ SOAJ
10.0000 mg | SUBCUTANEOUS | 1 refills | Status: DC
  Filled 2023-12-09: qty 2, 28d supply, fill #0
  Filled 2024-01-08 – 2024-01-09 (×2): qty 2, 28d supply, fill #1

## 2023-12-09 NOTE — Progress Notes (Signed)
 Terri [name], D.O.  ABFM, ABOM Specializing in Clinical Bariatric Medicine  Office located at: [address] [place], [place]  [address]   Assessment and Plan:   Orders Placed This Encounter  Procedures   Comprehensive metabolic panel with GFR   Hemoglobin A1c   Lipid panel   VITAMIN D  25 Hydroxy (Vit-D Deficiency, Fractures)   Vitamin B12   Medications Discontinued During This Encounter  Medication Reason   Vitamin D , Ergocalciferol , (DRISDOL ) 1.25 MG (50000 UNIT) CAPS capsule Reorder   tirzepatide  (MOUNJARO ) 10 MG/0.5ML Pen Reorder    Meds ordered this encounter  Medications   tirzepatide  (MOUNJARO ) 10 MG/0.5ML Pen    Sig: Inject 10 mg into the skin once a week.    Dispense:  2 mL    Refill:  1   Vitamin D , Ergocalciferol , (DRISDOL ) 1.25 MG (50000 UNIT) CAPS capsule    Sig: Take 1 capsule by mouth every 10 days    Dispense:  6 capsule    Refill:  0   cyanocobalamin  (VITAMIN B12) 500 MCG tablet    Sig: Take 1 tablet (500 mcg total) by mouth daily.     Will obtain FASTING labs (Fasting lipid, CMP, A1c, Vit D, B12) during next OV.  FOR THE DISEASE OF OBESITY:  38.0-38.9,adult - current BMI 37.63 Morbid obesity (HCC)-start bmi 41.16/date 08/22/21 Assessment & Plan: Since last office visit on 10/14/2023, patient's muscle mass has decreased by 2.2 lbs. Fat mass has decreased by 0.2 lbs. Counseling done on how various foods will affect these numbers and how to maximize success  Total lbs lost to date: 22 lbs Total weight loss percentage to date: 8.63%    Recommended Dietary Goals Ryle is currently in the action stage of change. As such, her goal is to continue weight management plan.  She has agreed to: continue current plan   Behavioral Intervention We discussed the following today: continue to work on maintaining a reduced calorie state, getting the recommended amount of protein, incorporating whole foods, making healthy choices, staying well hydrated  and practicing mindfulness when eating.  Additional resources provided today: Handout on CAT 1 meal plan  and Handout on risks/ benefits of metformin  and associated Myths of use  Evidence-based interventions for health behavior change were utilized today including the discussion of self monitoring techniques, problem-solving barriers and SMART goal setting techniques.   Regarding patient's less desirable eating habits and patterns, we employed the technique of small changes.   Pt will specifically work on: Increase protein and water intake for next visit.    Recommended Physical Activity Goals [name] has been advised to work up to 300-450 minutes of moderate intensity aerobic activity a week and strengthening exercises 2-3 times per week for cardiovascular health, weight loss maintenance and preservation of muscle mass.   She has agreed to :  Increase physical activity in their day and reduce sedentary time (increase NEAT).   Pharmacotherapy We both agreed to: continue with nutritional and behavioral strategies and current medication regimen   FOR ASSOCIATED CONDITIONS ADDRESSED TODAY:  Type 2 diabetes mellitus with obesity (HCC) Assessment & Plan: [name] is taking Mounjaro  10 mg once weekly and Metformin  500 mg twice daily. Pt denies any adverse SE, hunger/cravings well controlled. Pt expressed questions about potentially stopping Metformin . Provided pt with handout on benefits on Metformin . Recommended [name] stay on medication for anti-obesogenic effect. Pt agrees to this regimen. Continue following RCNP. Will recheck A1c during next OV.   Relevant Orders: -  Tirzepatide ; Inject 10 mg into the skin once a week.  Dispense: 2 mL; Refill: 1 -     Hemoglobin A1c   Hypertension associated with type 2 diabetes mellitus [facility]) Assessment & Plan: BP Readings from Last 3 Encounters:  12/09/23 124/74  10/21/23 126/75  10/14/23 113/70  Pt is on Norvasc  5 mg and Diovan -HCT 320-25  mg. Condition well controlled on medication, BP is at goal today. Lifestyle changes such as following our low salt, heart healthy meal plan and engaging in a regular exercise program discussed. Reminded patient that if they ever feel poorly in any way, to check their blood pressure and pulse as well. Will continue to monitor levels closely.   Relevant Orders: -     Comprehensive metabolic panel with GFR   Hyperlipidemia, unspecified hyperlipidemia type Assessment & Plan: Lab Results  Component Value Date   CHOL 195 05/01/2023   HDL 61 05/01/2023   LDLCALC 110 (H) 05/01/2023   TRIG 136 05/01/2023   CHOLHDL 3.2 05/01/2023  [name] is prescribed Crestor  20 mg daily, she endorses only taking Crestor  10 mg daily. Last lipid panel was obtained 7 months ago and LDL was elevated at 110. Pt reports she was not taking medication at the time. Encouraged pt to continue taking medication at current regimen. ALYVEA KICK agrees to continue with meds and our treatment plan of a heart-heathy, low cholesterol meal plan. Will recheck lipid panel during next OV.  Relevant Orders: -     Lipid panel   B12 deficiency due to diet Assessment & Plan: Lab Results  Component Value Date   VITAMINB12 360 05/01/2023  Reviewed last obtained B12 from PCP which was 360. Informed pt that goal levels for weight loss is >500 to enhance energy and weight loss. Educated pt on benefits of optimal B12 levels. Will start on OTC B12 today. Pt is agreeable to this supplement. Will continue to monitor.   Relevant Orders: -     Vitamin B12 -     Cyanocobalamin ; Take 1 tablet (500 mcg total) by mouth daily.   Vitamin D  deficiency Assessment & Plan: Lab Results  Component Value Date   VD25OH 42.9 07/16/2023  [name] is taking ERGO 50,000 units every 10 days. Last Vit D was sub optimal at 42.9. Tolerating supplement well, no adverse SE reported. Pt prefers not to have labs done during today's OV, will obtain during  next visit. Will refill supplement today.   Relevant Orders: -     Vitamin D  (Ergocalciferol ); Take 1 capsule by mouth every 10 days  Dispense: 6 capsule; Refill: 0 -     VITAMIN D  25 Hydroxy (Vit-D Deficiency, Fractures)  Follow up:   Return in about 5 weeks (around 01/14/2024). She was informed of the importance of frequent follow up visits to maximize her success with intensive lifestyle modifications for her multiple health conditions.  Subjective:   Chief complaint: Obesity Chudney is here to discuss her progress with her obesity treatment plan. She is keeping a food journal and adhering to recommended goals of 1000-1100 calories and 90+ protein and states she is following her eating plan approximately 80% of the time. She states she is walking 30 minutes 5 days per week.   Interval History:  JELYSSA BAILE is here for a follow up office visit. Since last OV on 10/14/2023, pt is down 3 lbs. She reports getting sick for a couple days and did not get in all of her foods. Additionally, Kimi is  journaling her food intake and hitting journaling parameters majority of the days.   Pharmacotherapy for weight loss: She is currently taking Metformin  500 mg twice daily and Mounjaro  10 mg once weekly.   Review of Systems:  Pertinent positives were addressed with patient today.  Reviewed by clinician on day of visit: allergies, medications, problem list, medical history, surgical history, family history, social history, and previous encounter notes.  Weight Summary and Biometrics   Weight Lost Since Last Visit: 3lb  Weight Gained Since Last Visit: 0lb   Vitals Temp: 98.7 F (37.1 C) BP: 124/74 Pulse Rate: 100 SpO2: 97 %   Anthropometric Measurements Height: 5\' 6"  (1.676 m) Weight: 233 lb (105.7 kg) BMI (Calculated): 37.63 Weight at Last Visit: 236lb Weight Lost Since Last Visit: 3lb Weight Gained Since Last Visit: 0lb Starting Weight: 255lb Total Weight Loss (lbs): 22 lb  (9.979 kg) Peak Weight: 260lb   Body Composition  Body Fat %: 50.1 % Fat Mass (lbs): 117 lbs Muscle Mass (lbs): 110.8 lbs Visceral Fat Rating : 15   Other Clinical Data Fasting: No Labs: No Today's Visit #: 31 Starting Date: 08/22/21    Objective:   PHYSICAL EXAM: Blood pressure 124/74, pulse 100, temperature 98.7 F (37.1 C), height 5\' 6"  (1.676 m), weight 233 lb (105.7 kg), SpO2 97%. Body mass index is 37.61 kg/m.  General: she is overweight, cooperative and in no acute distress. PSYCH: Has normal mood, affect and thought process.   HEENT: EOMI, sclerae are anicteric. Lungs: Normal breathing effort, no conversational dyspnea. Extremities: Moves * 4 Neurologic: A and O * 3, good insight  DIAGNOSTIC DATA REVIEWED: BMET    Component Value Date/Time   NA 139 05/01/2023 1602   K 4.4 05/01/2023 1602   CL 100 05/01/2023 1602   CO2 24 05/01/2023 1602   GLUCOSE 90 05/01/2023 1602   GLUCOSE 125 (H) 11/28/2017 2010   BUN 16 05/01/2023 1602   CREATININE 0.63 05/01/2023 1602   CALCIUM  9.7 05/01/2023 1602   GFRNONAA >60 11/28/2017 2010   GFRAA >60 11/28/2017 2010   Lab Results  Component Value Date   HGBA1C 6.0 (H) 07/16/2023   HGBA1C 7.4 (H) 02/28/2021   No results found for: "INSULIN " Lab Results  Component Value Date   TSH 1.140 05/01/2023   CBC    Component Value Date/Time   WBC 4.7 05/01/2023 1602   WBC 6.6 11/28/2017 2010   RBC 4.01 05/01/2023 1602   RBC 4.31 11/28/2017 2010   HGB 11.8 05/01/2023 1602   HCT 37.2 05/01/2023 1602   PLT 225 05/01/2023 1602   MCV 93 05/01/2023 1602   MCH 29.4 05/01/2023 1602   MCH 29.0 11/28/2017 2010   MCHC 31.7 05/01/2023 1602   MCHC 30.9 11/28/2017 2010   RDW 12.9 05/01/2023 1602   Iron Studies No results found for: "IRON", "TIBC", "FERRITIN", "IRONPCTSAT" Lipid Panel     Component Value Date/Time   CHOL 195 05/01/2023 1602   TRIG 136 05/01/2023 1602   HDL 61 05/01/2023 1602   CHOLHDL 3.2 05/01/2023 1602    LDLCALC 110 (H) 05/01/2023 1602   Hepatic Function Panel     Component Value Date/Time   PROT 7.5 05/01/2023 1602   ALBUMIN 4.5 05/01/2023 1602   AST 24 05/01/2023 1602   ALT 24 05/01/2023 1602   ALKPHOS 49 05/01/2023 1602   BILITOT 0.2 05/01/2023 1602   BILIDIR 0.2 03/24/2016 0650   IBILI 0.5 03/24/2016 0650      Component Value  Date/Time   TSH 1.140 05/01/2023 1602   Nutritional Lab Results  Component Value Date   VD25OH 42.9 07/16/2023   VD25OH 36.5 05/01/2023   VD25OH 34.3 02/09/2023    Attestations:   I, Camryn Mix, acting as a Stage manager for [employer], DO., have compiled all relevant documentation for today's office visit on behalf of Marceil Sensor, DO, while in the presence of [employer], DO.  I have reviewed the above documentation for accuracy and completeness, and I agree with the above. Terri [name], D.O.  The 21st Century Cures Act was signed into law in 2016 which includes the topic of electronic health records.  This provides immediate access to information in MyChart.  This includes consultation notes, operative notes, office notes, lab results and pathology reports.  If you have any questions about what you read please let us  know at your next visit so we can discuss your concerns and take corrective action if need be.  We are right here with you.

## 2023-12-11 ENCOUNTER — Ambulatory Visit (INDEPENDENT_AMBULATORY_CARE_PROVIDER_SITE_OTHER): Admitting: Internal Medicine

## 2023-12-11 ENCOUNTER — Encounter: Payer: Self-pay | Admitting: Internal Medicine

## 2023-12-11 VITALS — BP 124/77 | HR 94 | Ht 66.0 in | Wt 238.4 lb

## 2023-12-11 DIAGNOSIS — Z7984 Long term (current) use of oral hypoglycemic drugs: Secondary | ICD-10-CM

## 2023-12-11 DIAGNOSIS — I10 Essential (primary) hypertension: Secondary | ICD-10-CM

## 2023-12-11 DIAGNOSIS — N819 Female genital prolapse, unspecified: Secondary | ICD-10-CM

## 2023-12-11 DIAGNOSIS — E1159 Type 2 diabetes mellitus with other circulatory complications: Secondary | ICD-10-CM

## 2023-12-11 DIAGNOSIS — E1169 Type 2 diabetes mellitus with other specified complication: Secondary | ICD-10-CM

## 2023-12-11 DIAGNOSIS — E785 Hyperlipidemia, unspecified: Secondary | ICD-10-CM

## 2023-12-11 DIAGNOSIS — E669 Obesity, unspecified: Secondary | ICD-10-CM

## 2023-12-11 DIAGNOSIS — Z6838 Body mass index (BMI) 38.0-38.9, adult: Secondary | ICD-10-CM

## 2023-12-11 NOTE — Patient Instructions (Signed)
 It was a pleasure to see you today.  Thank you for giving us  the opportunity to be involved in your care.  Below is a brief recap of your visit and next steps.  We will plan to see you again in 6 months.  Summary No medication changes today Urogynecology referral placed Follow up in 6 months

## 2023-12-11 NOTE — Progress Notes (Signed)
 Established Patient Office Visit  Subjective   Patient ID: Terri [name], female    DOB: 11-19-1965  Age: 58 y.o. MRN: [id number]  Chief Complaint  Patient presents with   Care Management    Six month follow up    Bladder Prolapse    Patient feels she has a bladder prolapse    Terri [name] returns to care today for routine follow-up.  She was last evaluated by me in September 2024 for her annual physical.  No medication changes were made at that time, repeat labs ordered, and 55-month follow-up arranged.  In the interim, she has been closely followed at [facility].  There have otherwise been no acute interval events.  Terri [name] reports feeling fairly well today.  She describes a bulge at the opening of her vagina that she has to push up.  Bulging seems worse when she needs to urinate.  She is concerned about prolapse.  Past Medical History:  Diagnosis Date   Arthritis    right hip and knee pain; had right ankle fusion   Back pain    Diabetes mellitus without complication (HCC)    Edema, lower extremity    Hip pain    Hyperlipidemia    Hypertension    Joint pain    Knee pain    Past Surgical History:  Procedure Laterality Date   ANKLE FUSION Right    APPENDECTOMY     CHOLECYSTECTOMY N/A 03/21/2016   Procedure: LAPAROSCOPIC CHOLECYSTECTOMY;  Surgeon: Alanda Allegra, MD;  Location: [facility];  Service: General;  Laterality: N/A;   TOTAL ABDOMINAL HYSTERECTOMY     Social History   Tobacco Use   Smoking status: Never   Smokeless tobacco: Never  Vaping Use   Vaping status: Never Used  Substance Use Topics   Alcohol use: No   Drug use: No   Family History  Problem Relation Age of Onset   Diabetes Mother    High blood pressure Mother    Stroke Mother    Obesity Mother    Sudden death Father    Allergies  Allergen Reactions   Penicillins     Develops yeast infections when on medication   Review of Systems  Constitutional:  Negative for  chills and fever.  HENT:  Negative for sore throat.   Respiratory:  Negative for cough and shortness of breath.   Cardiovascular:  Negative for chest pain, palpitations and leg swelling.  Gastrointestinal:  Negative for abdominal pain, blood in stool, constipation, diarrhea, nausea and vomiting.  Genitourinary:  Negative for dysuria and hematuria.       Bulge at vaginal opening  Musculoskeletal:  Negative for myalgias.  Skin:  Negative for itching and rash.  Neurological:  Negative for dizziness and headaches.  Psychiatric/Behavioral:  Negative for depression and suicidal ideas.      Objective:     BP 124/77   Pulse 94   Ht 5\' 6"  (1.676 m)   Wt 238 lb 6.4 oz (108.1 kg)   SpO2 95%   BMI 38.48 kg/m  BP Readings from Last 3 Encounters:  12/11/23 124/77  12/09/23 124/74  10/21/23 126/75   Physical Exam Vitals reviewed.  Constitutional:      General: She is not in acute distress.    Appearance: Normal appearance. She is obese. She is not toxic-appearing.  HENT:     Head: Normocephalic and atraumatic.     Right Ear: External ear normal.     Left  Ear: External ear normal.     Nose: Nose normal. No congestion or rhinorrhea.     Mouth/Throat:     Mouth: Mucous membranes are moist.     Pharynx: Oropharynx is clear. No oropharyngeal exudate or posterior oropharyngeal erythema.  Eyes:     General: No scleral icterus.    Extraocular Movements: Extraocular movements intact.     Conjunctiva/sclera: Conjunctivae normal.     Pupils: Pupils are equal, round, and reactive to light.  Cardiovascular:     Rate and Rhythm: Normal rate and regular rhythm.     Pulses: Normal pulses.     Heart sounds: Normal heart sounds. No murmur heard.    No friction rub. No gallop.  Pulmonary:     Effort: Pulmonary effort is normal.     Breath sounds: Normal breath sounds. No wheezing, rhonchi or rales.  Abdominal:     General: Abdomen is flat. Bowel sounds are normal. There is no distension.      Palpations: Abdomen is soft.     Tenderness: There is no abdominal tenderness.  Musculoskeletal:        General: No swelling. Normal range of motion.     Cervical back: Normal range of motion.     Right lower leg: No edema.     Left lower leg: No edema.  Lymphadenopathy:     Cervical: No cervical adenopathy.  Skin:    General: Skin is warm and dry.     Capillary Refill: Capillary refill takes less than 2 seconds.     Coloration: Skin is not jaundiced.  Neurological:     General: No focal deficit present.     Mental Status: She is alert and oriented to person, place, and time.  Psychiatric:        Mood and Affect: Mood normal.        Behavior: Behavior normal.   Last CBC Lab Results  Component Value Date   WBC 4.7 05/01/2023   HGB 11.8 05/01/2023   HCT 37.2 05/01/2023   MCV 93 05/01/2023   MCH 29.4 05/01/2023   RDW 12.9 05/01/2023   PLT 225 05/01/2023   Last metabolic panel Lab Results  Component Value Date   GLUCOSE 90 05/01/2023   NA 139 05/01/2023   K 4.4 05/01/2023   CL 100 05/01/2023   CO2 24 05/01/2023   BUN 16 05/01/2023   CREATININE 0.63 05/01/2023   EGFR 104 05/01/2023   CALCIUM  9.7 05/01/2023   PHOS 2.3 (L) 03/22/2016   PROT 7.5 05/01/2023   ALBUMIN 4.5 05/01/2023   LABGLOB 3.0 05/01/2023   AGRATIO 1.8 03/18/2022   BILITOT 0.2 05/01/2023   ALKPHOS 49 05/01/2023   AST 24 05/01/2023   ALT 24 05/01/2023   ANIONGAP 11 11/28/2017   Last lipids Lab Results  Component Value Date   CHOL 195 05/01/2023   HDL 61 05/01/2023   LDLCALC 110 (H) 05/01/2023   TRIG 136 05/01/2023   CHOLHDL 3.2 05/01/2023   Last hemoglobin A1c Lab Results  Component Value Date   HGBA1C 6.0 (H) 07/16/2023   Last thyroid functions Lab Results  Component Value Date   TSH 1.140 05/01/2023   Last vitamin D  Lab Results  Component Value Date   VD25OH 42.9 07/16/2023   Last vitamin B12 and Folate Lab Results  Component Value Date   VITAMINB12 360 05/01/2023   FOLATE  7.6 05/01/2023   The 10-year ASCVD risk score (Arnett DK, et al., 2019) is: 10.6%  Assessment & Plan:   Problem List Items Addressed This Visit       Hypertension, essential   Remains adequately controlled on current antihypertensive regimen.  No medication changes are indicated today.      Type 2 diabetes mellitus with obesity (HCC)   A1c 6.0 on labs from December 2024.  She is currently prescribed metformin  500 mg twice daily and Mounjaro  10 mg weekly.  Repeat labs are currently pending.      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  Symptoms are worse when she feels the urge to urinate and she has to "push" the bulge back in.  GU exam was declined today.  Her description of symptoms is concerning for pelvic organ prolapse, specifically cystocele.  Will place referral to urogynecology for further evaluation.      HLD (hyperlipidemia)   Lipid panel updated in September 2024.  Total cholesterol 195 and LDL 110.  She is currently taking rosuvastatin  10 mg daily and has continued to focus on dietary changes in an effort to lose weight.  Repeat lipid panel is pending.      current BMI 38.5   Closely followed by [facility].  Last seen on 4/30 for follow-up.  She has lost a total of 22 pounds to date since joining the program and is motivated to continue efforts aimed at weight loss.      Return in about 6 months (around 06/12/2024).   Tobi Fortes, MD

## 2023-12-14 ENCOUNTER — Other Ambulatory Visit: Payer: Self-pay | Admitting: Internal Medicine

## 2023-12-14 DIAGNOSIS — I10 Essential (primary) hypertension: Secondary | ICD-10-CM

## 2023-12-22 ENCOUNTER — Ambulatory Visit: Payer: Self-pay

## 2023-12-31 ENCOUNTER — Encounter: Payer: Self-pay | Admitting: Internal Medicine

## 2023-12-31 DIAGNOSIS — N819 Female genital prolapse, unspecified: Secondary | ICD-10-CM | POA: Insufficient documentation

## 2023-12-31 NOTE — Assessment & Plan Note (Signed)
 Closely followed by [facility].  Last seen on 4/30 for follow-up.  She has lost a total of 22 pounds to date since joining the program and is motivated to continue efforts aimed at weight loss.

## 2023-12-31 NOTE — Assessment & Plan Note (Signed)
 A1c 6.0 on labs from December 2024.  She is currently prescribed metformin  500 mg twice daily and Mounjaro  10 mg weekly.  Repeat labs are currently pending.

## 2023-12-31 NOTE — Assessment & Plan Note (Signed)
 Remains adequately controlled on current antihypertensive regimen.  No medication changes are indicated today.

## 2023-12-31 NOTE — Assessment & Plan Note (Signed)
 Her acute concerns today is a bulge at the vaginal opening.  Symptoms are worse when she feels the urge to urinate and she has to "push" the bulge back in.  GU exam was declined today.  Her description of symptoms is concerning for pelvic organ prolapse, specifically cystocele.  Will place referral to urogynecology for further evaluation.

## 2023-12-31 NOTE — Assessment & Plan Note (Signed)
 Lipid panel updated in September 2024.  Total cholesterol 195 and LDL 110.  She is currently taking rosuvastatin  10 mg daily and has continued to focus on dietary changes in an effort to lose weight.  Repeat lipid panel is pending.

## 2024-01-03 ENCOUNTER — Other Ambulatory Visit: Payer: Self-pay | Admitting: Medical Genetics

## 2024-01-08 ENCOUNTER — Ambulatory Visit

## 2024-01-09 ENCOUNTER — Other Ambulatory Visit (HOSPITAL_COMMUNITY): Payer: Self-pay

## 2024-01-12 ENCOUNTER — Ambulatory Visit

## 2024-01-14 ENCOUNTER — Other Ambulatory Visit (HOSPITAL_COMMUNITY): Payer: Self-pay

## 2024-01-14 ENCOUNTER — Ambulatory Visit (INDEPENDENT_AMBULATORY_CARE_PROVIDER_SITE_OTHER): Admitting: Family Medicine

## 2024-01-14 ENCOUNTER — Encounter (INDEPENDENT_AMBULATORY_CARE_PROVIDER_SITE_OTHER): Payer: Self-pay | Admitting: Family Medicine

## 2024-01-14 VITALS — BP 95/63 | HR 91 | Temp 97.9°F | Ht 66.0 in | Wt 227.0 lb

## 2024-01-14 DIAGNOSIS — Z7984 Long term (current) use of oral hypoglycemic drugs: Secondary | ICD-10-CM

## 2024-01-14 DIAGNOSIS — E1169 Type 2 diabetes mellitus with other specified complication: Secondary | ICD-10-CM

## 2024-01-14 DIAGNOSIS — Z6836 Body mass index (BMI) 36.0-36.9, adult: Secondary | ICD-10-CM | POA: Diagnosis not present

## 2024-01-14 DIAGNOSIS — E559 Vitamin D deficiency, unspecified: Secondary | ICD-10-CM | POA: Diagnosis not present

## 2024-01-14 DIAGNOSIS — Z7985 Long-term (current) use of injectable non-insulin antidiabetic drugs: Secondary | ICD-10-CM

## 2024-01-14 MED ORDER — TIRZEPATIDE 10 MG/0.5ML ~~LOC~~ SOAJ
10.0000 mg | SUBCUTANEOUS | 1 refills | Status: DC
Start: 2024-01-14 — End: 2024-02-04
  Filled 2024-01-14 – 2024-01-15 (×2): qty 2, 28d supply, fill #0

## 2024-01-14 MED ORDER — VITAMIN D (ERGOCALCIFEROL) 1.25 MG (50000 UNIT) PO CAPS
50000.0000 [IU] | ORAL_CAPSULE | ORAL | 0 refills | Status: DC
Start: 2024-01-14 — End: 2024-02-04
  Filled 2024-01-14: qty 6, fill #0
  Filled 2024-01-15: qty 6, 60d supply, fill #0

## 2024-01-14 NOTE — Progress Notes (Signed)
   SUBJECTIVE:  Chief Complaint: Obesity  Interim History: Patient first visit with me- normally sees Dr. MALVA.  She has enjoyed her time here at this clinic.  She understands the importance of protein and consistent intake of protein.  She tried keto previously and she couldn't understand why her blood sugars are still high.  No hunger, no cravings.  She brought food tracking log int today and average calories are around 1100 calories and 90 grams of protein daily.  Last RMR from 2023 at first appointment.   She is going to [place]  in 2 days for 12 days.  She is visiting her daughter and grandsons.  Feels journaling will be easiest to stay consistent with over the next few weeks.   Terri [name] is here to discuss her progress with her obesity treatment plan. She is on the keeping a food journal and adhering to recommended goals of 1000-1100 calories and 90 grams of protein and states she is following her eating plan approximately 80 % of the time. She states she is walking 45-60 minutes 5 times per week she is doing some walking and activity on the exercise bike.   OBJECTIVE: Visit Diagnoses: Problem List Items Addressed This Visit       Endocrine   Type 2 diabetes mellitus with obesity (HCC) - Primary     Other   Vitamin D  deficiency   Morbid obesity (HCC)   Other Visit Diagnoses       BMI 36.0-36.9,adult           No data recorded  No data recorded  No data recorded  No data recorded    ASSESSMENT AND PLAN: Assessment & Plan Type 2 diabetes mellitus with obesity (HCC) A1c 6.0 on labs from December 2024.  She is currently prescribed metformin  500 mg twice daily and Mounjaro  10 mg weekly.  Repeat labs are currently pending. Vitamin D  deficiency Currently prescribed vitamin D  supplementation (50,000 IU) every 3 weeks.  Repeat vitamin D  level ordered today. Morbid obesity (HCC)-start bmi 41.16/date 08/22/21  BMI 36.0-36.9,adult    Diet: Terri [name] is currently in the action  stage of change. As such, her goal is to continue with weight loss efforts and ha.ids agreed to keeping a food journal and adhering to recommended goals of 1000-1100 calories and 90 or more grams protein daily.   Exercise:  For substantial health benefits, adults should do at least 150 minutes (2 hours and 30 minutes) a week of moderate-intensity, or 75 minutes (1 hour and 15 minutes) a week of vigorous-intensity aerobic physical activity, or an equivalent combination of moderate- and vigorous-intensity aerobic activity. Aerobic activity should be performed in episodes of at least 10 minutes, and preferably, it should be spread throughout the week.  Behavior Modification:  We discussed the following Behavioral Modification Strategies today: increasing lean protein intake, decreasing simple carbohydrates, increasing vegetables, meal planning and cooking strategies, keeping healthy foods in the home, and avoiding temptations.   No follow-ups on file.   She was informed of the importance of frequent follow up visits to maximize her success with intensive lifestyle modifications for her multiple health conditions.  Attestation Statements:   Reviewed by clinician on day of visit: allergies, medications, problem list, medical history, surgical history, family history, social history, and previous encounter notes.     Adelita Cho, MD

## 2024-01-15 ENCOUNTER — Other Ambulatory Visit: Payer: Self-pay

## 2024-01-15 ENCOUNTER — Telehealth: Payer: Self-pay | Admitting: Internal Medicine

## 2024-01-15 ENCOUNTER — Other Ambulatory Visit (HOSPITAL_COMMUNITY): Payer: Self-pay

## 2024-01-15 DIAGNOSIS — E669 Obesity, unspecified: Secondary | ICD-10-CM

## 2024-01-15 LAB — LIPID PANEL

## 2024-01-15 MED ORDER — ONETOUCH VERIO VI STRP: ORAL_STRIP | 12 refills | Status: AC

## 2024-01-15 NOTE — Telephone Encounter (Signed)
 Prescription Request  01/15/2024  LOV: 12/11/2023  What is the name of the medication or equipment? ONE TOUCH VERIO TEST STRIPS   Have you contacted your pharmacy to request a refill? No   Which pharmacy would you like this sent to?   [id number] - [place], [place] - [address] AT [facility] [address] [place] [address] Phone: [phone] Fax: [phone]      Patient notified that their request is being sent to the clinical staff for review and that they should receive a response within 2 business days.   Please advise at [facility] [phone]

## 2024-01-15 NOTE — Telephone Encounter (Signed)
 Refilled

## 2024-01-25 LAB — LIPID PANEL
Chol/HDL Ratio: 2.3 ratio (ref 0.0–4.4)
Cholesterol, Total: 141 mg/dL (ref 100–199)
HDL: 62 mg/dL (ref >39)
LDL Chol Calc (NIH): 61 mg/dL (ref 0–99)
Triglycerides: 97 mg/dL (ref 0–149)
VLDL Cholesterol Cal: 18 mg/dL (ref 5–40)

## 2024-01-25 LAB — COMPREHENSIVE METABOLIC PANEL WITH GFR
ALT: 20 IU/L (ref 0–32)
AST: 20 IU/L (ref 0–40)
Albumin: 4.7 g/dL (ref 3.8–4.9)
Alkaline Phosphatase: 56 IU/L (ref 44–121)
BUN/Creatinine Ratio: 24 — ABNORMAL HIGH (ref 9–23)
BUN: 18 mg/dL (ref 6–24)
Bilirubin Total: 0.2 mg/dL (ref 0.0–1.2)
CO2: 23 mmol/L (ref 20–29)
Calcium: 9.9 mg/dL (ref 8.7–10.2)
Chloride: 100 mmol/L (ref 96–106)
Creatinine, Ser: 0.74 mg/dL (ref 0.57–1.00)
Globulin, Total: 2.5 g/dL (ref 1.5–4.5)
Glucose: 88 mg/dL (ref 70–99)
Potassium: 4.3 mmol/L (ref 3.5–5.2)
Sodium: 138 mmol/L (ref 134–144)
Total Protein: 7.2 g/dL (ref 6.0–8.5)
eGFR: 94 mL/min/{1.73_m2} (ref >59)

## 2024-01-25 LAB — HEMOGLOBIN A1C
Est. average glucose Bld gHb Est-mCnc: 123 mg/dL
Hgb A1c MFr Bld: 5.9 % — ABNORMAL HIGH (ref 4.8–5.6)

## 2024-01-25 LAB — VITAMIN D 25 HYDROXY (VIT D DEFICIENCY, FRACTURES)

## 2024-01-25 LAB — VITAMIN B12

## 2024-01-26 ENCOUNTER — Telehealth (INDEPENDENT_AMBULATORY_CARE_PROVIDER_SITE_OTHER): Payer: Self-pay

## 2024-01-26 DIAGNOSIS — E559 Vitamin D deficiency, unspecified: Secondary | ICD-10-CM

## 2024-01-26 DIAGNOSIS — E538 Deficiency of other specified B group vitamins: Secondary | ICD-10-CM

## 2024-01-26 NOTE — Telephone Encounter (Signed)
 Called patient to inform her that [facility] didn't have enough blood to be able runn her Vit B12 and Vit D labs. Pt will like to come back in on Monday to be redraw her labs. Labs orders has been place, Pt verbalized understanding. Pt had no questions at this time but was encouraged to call back if questions arise.

## 2024-01-29 ENCOUNTER — Other Ambulatory Visit (HOSPITAL_COMMUNITY): Payer: Self-pay

## 2024-01-30 ENCOUNTER — Other Ambulatory Visit: Payer: Self-pay | Admitting: Internal Medicine

## 2024-01-30 DIAGNOSIS — E1169 Type 2 diabetes mellitus with other specified complication: Secondary | ICD-10-CM

## 2024-02-01 ENCOUNTER — Other Ambulatory Visit

## 2024-02-01 ENCOUNTER — Other Ambulatory Visit (INDEPENDENT_AMBULATORY_CARE_PROVIDER_SITE_OTHER): Payer: Self-pay | Admitting: Family Medicine

## 2024-02-01 ENCOUNTER — Ambulatory Visit: Admitting: Physician Assistant

## 2024-02-01 DIAGNOSIS — Z006 Encounter for examination for normal comparison and control in clinical research program: Secondary | ICD-10-CM

## 2024-02-02 ENCOUNTER — Ambulatory Visit (INDEPENDENT_AMBULATORY_CARE_PROVIDER_SITE_OTHER)

## 2024-02-02 ENCOUNTER — Encounter (HOSPITAL_COMMUNITY): Payer: Self-pay

## 2024-02-02 ENCOUNTER — Ambulatory Visit

## 2024-02-02 DIAGNOSIS — Z111 Encounter for screening for respiratory tuberculosis: Secondary | ICD-10-CM | POA: Diagnosis not present

## 2024-02-02 LAB — VITAMIN D 25 HYDROXY (VIT D DEFICIENCY, FRACTURES): Vit D, 25-Hydroxy: 44.8 ng/mL (ref 30.0–100.0)

## 2024-02-02 LAB — VITAMIN B12: Vitamin B-12: 835 pg/mL (ref 232–1245)

## 2024-02-02 NOTE — Progress Notes (Signed)
Patient is in office today for a nurse visit for PPD. Patient Injection was given in the  Left arm. Patient tolerated injection well.

## 2024-02-03 ENCOUNTER — Encounter (HOSPITAL_COMMUNITY)

## 2024-02-03 DIAGNOSIS — Z1231 Encounter for screening mammogram for malignant neoplasm of breast: Secondary | ICD-10-CM

## 2024-02-04 ENCOUNTER — Other Ambulatory Visit (HOSPITAL_COMMUNITY): Payer: Self-pay

## 2024-02-04 ENCOUNTER — Ambulatory Visit: Payer: Self-pay

## 2024-02-04 ENCOUNTER — Encounter (INDEPENDENT_AMBULATORY_CARE_PROVIDER_SITE_OTHER): Payer: Self-pay | Admitting: Family Medicine

## 2024-02-04 ENCOUNTER — Ambulatory Visit (INDEPENDENT_AMBULATORY_CARE_PROVIDER_SITE_OTHER): Admitting: Family Medicine

## 2024-02-04 VITALS — BP 117/75 | HR 76 | Temp 97.9°F | Ht 66.0 in | Wt 231.0 lb

## 2024-02-04 DIAGNOSIS — E559 Vitamin D deficiency, unspecified: Secondary | ICD-10-CM | POA: Diagnosis not present

## 2024-02-04 DIAGNOSIS — E1169 Type 2 diabetes mellitus with other specified complication: Secondary | ICD-10-CM | POA: Diagnosis not present

## 2024-02-04 DIAGNOSIS — E785 Hyperlipidemia, unspecified: Secondary | ICD-10-CM | POA: Diagnosis not present

## 2024-02-04 DIAGNOSIS — Z6837 Body mass index (BMI) 37.0-37.9, adult: Secondary | ICD-10-CM

## 2024-02-04 DIAGNOSIS — E538 Deficiency of other specified B group vitamins: Secondary | ICD-10-CM | POA: Diagnosis not present

## 2024-02-04 DIAGNOSIS — Z7984 Long term (current) use of oral hypoglycemic drugs: Secondary | ICD-10-CM

## 2024-02-04 DIAGNOSIS — Z7985 Long-term (current) use of injectable non-insulin antidiabetic drugs: Secondary | ICD-10-CM

## 2024-02-04 DIAGNOSIS — Z6838 Body mass index (BMI) 38.0-38.9, adult: Secondary | ICD-10-CM

## 2024-02-04 LAB — TB SKIN TEST
Induration: 0.01 mm
TB Skin Test: NEGATIVE

## 2024-02-04 MED ORDER — TIRZEPATIDE 10 MG/0.5ML ~~LOC~~ SOAJ
10.0000 mg | SUBCUTANEOUS | 0 refills | Status: DC
Start: 2024-02-04 — End: 2024-03-03
  Filled 2024-02-04: qty 2, 28d supply, fill #0

## 2024-02-04 MED ORDER — CYANOCOBALAMIN 500 MCG PO TABS: 500.0000 ug | ORAL_TABLET | Freq: Every day | ORAL | Status: DC

## 2024-02-04 MED ORDER — POLYETHYLENE GLYCOL 3350 17 GM/SCOOP PO POWD: 17.0000 g | Freq: Every day | ORAL | Status: DC

## 2024-02-04 MED ORDER — VITAMIN D (ERGOCALCIFEROL) 1.25 MG (50000 UNIT) PO CAPS
50000.0000 [IU] | ORAL_CAPSULE | ORAL | 0 refills | Status: DC
  Filled 2024-02-04: qty 6, 60d supply, fill #0

## 2024-02-04 NOTE — Progress Notes (Unsigned)
 Terri [name], D.O.  ABFM, ABOM Specializing in Clinical Bariatric Medicine Office located at: [address] [place], [place]  [address]      Assessment and Plan:  No orders of the defined types were placed in this encounter.  Medications Discontinued During This Encounter  Medication Reason   polyethylene glycol powder (GLYCOLAX /MIRALAX ) 17 GM/SCOOP powder Reorder   cyanocobalamin  (VITAMIN B12) 500 MCG tablet Reorder   tirzepatide  (MOUNJARO ) 10 MG/0.5ML Pen Reorder   Vitamin D , Ergocalciferol , (DRISDOL ) 1.25 MG (50000 UNIT) CAPS capsule Reorder    Meds ordered this encounter  Medications   cyanocobalamin  (VITAMIN B12) 500 MCG tablet    Sig: Take 1 tablet (500 mcg total) by mouth daily.   polyethylene glycol powder (GLYCOLAX /MIRALAX ) 17 GM/SCOOP powder    Sig: Take 17 g by mouth daily.   Vitamin D , Ergocalciferol , (DRISDOL ) 1.25 MG (50000 UNIT) CAPS capsule    Sig: Take 1 capsule by mouth every 10 days    Dispense:  9 capsule    Refill:  0   tirzepatide  (MOUNJARO ) 10 MG/0.5ML Pen    Sig: Inject 10 mg into the skin once a week.    Dispense:  2 mL    Refill:  0    Come in 30 minutes early to her next visit (at 7:10 AM on 7/24) for repeat IC test and labs. Come fasting, no caffeine, and no exercise/activity prior to OV.    FOR THE DISEASE OF OBESITY: Morbid obesity (HCC)-start bmi 41.16/date 08/22/21 Current BMI 37.28 Assessment & Plan: Since last office visit with Dr. Elio on 01/14/24 patient's muscle mass has increased by 6.4 lbs. Fat mass has decreased by 2.4 lbs. Total body water has increased by 0.4 lb.  Counseling done on how various foods will affect these numbers and how to maximize success  Total lbs lost to date: 24 lbs Total weight loss percentage to date: -9.41%    Recommended Dietary Goals Terri [name] is currently in the action stage of change. As such, her goal is to continue weight management plan.  She has agreed to: continue current  plan   Behavioral Intervention We discussed the following today: increasing lean protein intake to established goals, increasing fiber rich foods, increasing water intake , continue to work on maintaining a reduced calorie state, getting the recommended amount of protein, incorporating whole foods, making healthy choices, staying well hydrated and practicing mindfulness when eating., and focusing on food with a 10:1 ratio of calories: grams of protein  Additional resources provided today: None  Evidence-based interventions for health behavior change were utilized today including the discussion of self monitoring techniques, problem-solving barriers and SMART goal setting techniques.   Regarding patient's less desirable eating habits and patterns, we employed the technique of small changes.   Pt will specifically work on: n/a   Recommended Physical Activity Goals Terri [name] has been advised to work up to 300-450 minutes of moderate intensity aerobic activity a week and strengthening exercises 2-3 times per week for cardiovascular health, weight loss maintenance and preservation of muscle mass.   She has agreed to: Continue current level of physical activity  and Increase the intensity, frequency or duration of aerobic exercises     Pharmacotherapy We both agreed to: Continue with current nutritional and behavioral strategies    ASSOCIATED CONDITIONS ADDRESSED TODAY:  Labs obtained on 01/14/24 and 02/01/24 by us  and reviewed with patient today and counseling done.  Type 2 diabetes mellitus with obesity [facility]) Assessment & Plan: Labs  reviewed with pt Lab Results  Component Value Date   HGBA1C 5.9 (H) 01/14/2024   HGBA1C 6.0 (H) 07/16/2023   HGBA1C 6.2 (H) 02/09/2023    Improving A1c. Compliant with Metformin  500 mg BID and Mounjaro  10 mg once weekly.  Tolerating well with no adverse SE. Hunger/cravings are well controlled. She has been meeting her protein intake goal daily. She endorses  constipation today, however, she notes this does not occur often and does not attribute this to Metformin  or Mounjaro . She tends to drink 80 oz of water per day. Discussed strategies to manage constipation, including increasing water intake to at least half her body weight in ounces of water, incorporating regular walking, and increasing fiber intake. Continue following her prudent nutritional meal plan. Continue with current medication regimen.     Vitamin D  deficiency Assessment & Plan: Labs reviewed with pt Lab Results  Component Value Date   VD25OH 44.8 02/01/2024   VD25OH CANCELED 01/14/2024   VD25OH 42.9 07/16/2023   Pt is slightly below goal. Compliant with ERGO 50K units once every 10 days. Tolerating well, no SE. Reviewed ideal vit D goal of 50-70.  Continue with supplementation at current dose since we are in summer and likely will increase some with sun exposure.Terri [name] No change made today.     B12 deficiency due to diet Assessment & Plan: Labs reviewed with pt Lab Results  Component Value Date   VITAMINB12 835 02/01/2024   VITAMINB12 CANCELED 01/14/2024   VITAMINB12 360 05/01/2023   B12 is at goal.  Counseling done on b12 def.  Compliant with B12 500 mcg once daily. Good tolerance, no SE. Continue supplementation, no changes. Will continue monitoring.     Hyperlipidemia associated with type 2 diabetes mellitus [facility]) Assessment & Plan: Labs reviewed with pt Lab Results  Component Value Date   CHOL 141 01/14/2024   HDL 62 01/14/2024   LDLCALC 61 01/14/2024   TRIG 97 01/14/2024   CHOLHDL 2.3 01/14/2024   From 9 months ago, pt did not increase her Crestor  taking 10 mg once daily. Has been working on diet and exercise.  LDL improved from 110 about 9 month ago to 61 as of 6/5.  Continue with statin therapy as advised.  Continue exercising and following her heart healthy nutritional meal plan.  Will continue to monitor alongside PCP/specialists.     Follow up:   Return  in about 4 weeks (around 03/03/2024) for 1 month f/u with Dr. MALVA. She was informed of the importance of frequent follow up visits to maximize her success with intensive lifestyle modifications for her multiple health conditions.  Subjective:   Chief complaint: Obesity Terri [name] is here to discuss her progress with her obesity treatment plan. She is keeping a food journal and adhering to recommended goals of 1000-1100 calories and 90+ grams of protein and states she is following her eating plan approximately 80% of the time. She states she is treadmill and weight lifting 60 minutes 3 days per week. On the treadmill she walks at an 8 incline and 2.4 speed (mph).   Interval History:  Terri [name] is here for a follow up office visit. Since last OV with Dr. Elio on 6/5, she gained 4 lbs. Recently went on vacation to [place] and was still able to stay consistent with exercise and journaling her daily calorie/protein intake. She has been increasing her calorie intake per day when exercising; eats 1000-1300 calories if she is exercising and aims for 1000-1100 on other  days.    Pharmacotherapy for weight loss: She is currently taking Metformin  500 mg BID and Mounjaro  10 mg once weekly.   Review of Systems:  Pertinent positives were addressed with patient today.  Reviewed by clinician on day of visit: allergies, medications, problem list, medical history, surgical history, family history, social history, and previous encounter notes.  Weight Summary and Biometrics   Weight Lost Since Last Visit: 0lb  Weight Gained Since Last Visit: 4lb    Vitals Temp: 97.9 F (36.6 C) BP: 117/75 Pulse Rate: 76 SpO2: 98 %   Anthropometric Measurements Height: 5' 6 (1.676 m) Weight: 231 lb (104.8 kg) BMI (Calculated): 37.3 Weight at Last Visit: 227lb Weight Lost Since Last Visit: 0lb Weight Gained Since Last Visit: 4lb Starting Weight: 255lb Total Weight Loss (lbs): 24 lb (10.9 kg) Peak  Weight: 260lb   Body Composition  Body Fat %: 46.2 % Fat Mass (lbs): 107 lbs Muscle Mass (lbs): 118.4 lbs Total Body Water (lbs): 88.6 lbs Visceral Fat Rating : 14   Other Clinical Data Fasting: No Labs: Yes Today's Visit #: 33 Starting Date: 08/22/21 Comments: 1000-1100/90    Objective:   PHYSICAL EXAM: Blood pressure 117/75, pulse 76, temperature 97.9 F (36.6 C), height 5' 6 (1.676 m), weight 231 lb (104.8 kg), SpO2 98%. Body mass index is 37.28 kg/m.  General: she is overweight, cooperative and in no acute distress. PSYCH: Has normal mood, affect and thought process.   HEENT: EOMI, sclerae are anicteric. Lungs: Normal breathing effort, no conversational dyspnea. Extremities: Moves * 4 Neurologic: A and O * 3, good insight  DIAGNOSTIC DATA REVIEWED: BMET    Component Value Date/Time   NA 138 01/14/2024 0827   K 4.3 01/14/2024 0827   CL 100 01/14/2024 0827   CO2 23 01/14/2024 0827   GLUCOSE 88 01/14/2024 0827   GLUCOSE 125 (H) 11/28/2017 2010   BUN 18 01/14/2024 0827   CREATININE 0.74 01/14/2024 0827   CALCIUM  9.9 01/14/2024 0827   GFRNONAA >60 11/28/2017 2010   GFRAA >60 11/28/2017 2010   Lab Results  Component Value Date   HGBA1C 5.9 (H) 01/14/2024   HGBA1C 7.4 (H) 02/28/2021   No results found for: INSULIN  Lab Results  Component Value Date   TSH 1.140 05/01/2023   CBC    Component Value Date/Time   WBC 4.7 05/01/2023 1602   WBC 6.6 11/28/2017 2010   RBC 4.01 05/01/2023 1602   RBC 4.31 11/28/2017 2010   HGB 11.8 05/01/2023 1602   HCT 37.2 05/01/2023 1602   PLT 225 05/01/2023 1602   MCV 93 05/01/2023 1602   MCH 29.4 05/01/2023 1602   MCH 29.0 11/28/2017 2010   MCHC 31.7 05/01/2023 1602   MCHC 30.9 11/28/2017 2010   RDW 12.9 05/01/2023 1602   Iron Studies No results found for: IRON, TIBC, FERRITIN, IRONPCTSAT Lipid Panel     Component Value Date/Time   CHOL 141 01/14/2024 0827   TRIG 97 01/14/2024 0827   HDL 62  01/14/2024 0827   CHOLHDL 2.3 01/14/2024 0827   LDLCALC 61 01/14/2024 0827   Hepatic Function Panel     Component Value Date/Time   PROT 7.2 01/14/2024 0827   ALBUMIN 4.7 01/14/2024 0827   AST 20 01/14/2024 0827   ALT 20 01/14/2024 0827   ALKPHOS 56 01/14/2024 0827   BILITOT 0.2 01/14/2024 0827   BILIDIR 0.2 03/24/2016 0650   IBILI 0.5 03/24/2016 0650      Component Value Date/Time  TSH 1.140 05/01/2023 1602   Nutritional Lab Results  Component Value Date   VD25OH 44.8 02/01/2024   VD25OH CANCELED 01/14/2024   VD25OH 42.9 07/16/2023    Attestations:   I, Vernell Forest, acting as a medical scribe for Terri Jenkins, DO., have compiled all relevant documentation for today's office visit on behalf of Terri Jenkins, DO, while in the presence of [employer], DO.  I have reviewed the above documentation for accuracy and completeness, and I agree with the above. Terri [name], D.O.  The 21st Century Cures Act was signed into law in 2016 which includes the topic of electronic health records.  This provides immediate access to information in MyChart.  This includes consultation notes, operative notes, office notes, lab results and pathology reports.  If you have any questions about what you read please let us  know at your next visit so we can discuss your concerns and take corrective action if need be.  We are right here with you.

## 2024-02-05 ENCOUNTER — Other Ambulatory Visit (HOSPITAL_COMMUNITY): Payer: Self-pay

## 2024-02-05 ENCOUNTER — Telehealth: Payer: Self-pay

## 2024-02-05 NOTE — Telephone Encounter (Signed)
 TB screening form  Noted Copied Sleeved  Original form placed in provider box Copy form placed in front desk folder Patient to come by next week to pick up, call patient when form is ready

## 2024-02-09 NOTE — Telephone Encounter (Signed)
 Disregard

## 2024-02-09 NOTE — Telephone Encounter (Signed)
 Patient was just seen 6/24 already given results, just needs that information on the form that was dropped off. Why appointment needed?

## 2024-02-09 NOTE — Telephone Encounter (Signed)
 Any update?

## 2024-02-11 ENCOUNTER — Encounter: Payer: Self-pay | Admitting: Physician Assistant

## 2024-02-11 ENCOUNTER — Ambulatory Visit: Admitting: Physician Assistant

## 2024-02-11 VITALS — BP 114/74

## 2024-02-11 DIAGNOSIS — R21 Rash and other nonspecific skin eruption: Secondary | ICD-10-CM

## 2024-02-11 DIAGNOSIS — Z872 Personal history of diseases of the skin and subcutaneous tissue: Secondary | ICD-10-CM | POA: Diagnosis not present

## 2024-02-11 LAB — GENECONNECT MOLECULAR SCREEN

## 2024-02-11 NOTE — Progress Notes (Signed)
   New Patient Visit   Subjective  Terri [name] is a 58 y.o. female who presents for the following: History of rash of chest and arms - it is clear today but you can still see where it was. Started in January of this year. It cleared up about 2 weeks ago. She was seen and evaluated twice - first time with a steroid injection and 2nd time with oral steroids. She was also given tacrolimus  0.1% and hydroxyzine  25 mg. She used Aveeno Eczema Relief and Cerave and they seemed to help a lot. It was starting to spread to her neck. Denies any new products or medications.     The following portions of the chart were reviewed this encounter and updated as appropriate: medications, allergies, medical history  Review of Systems:  No other skin or systemic complaints except as noted in HPI or Assessment and Plan.  Objective  Well appearing patient in no apparent distress; mood and affect are within normal limits.   A focused examination was performed of the following areas: Arms, neck, chest   Relevant exam findings are noted in the Assessment and Plan.    Assessment & Plan   HISTORY OF RASH Exam: Clear today  Treatment Plan: Advised patient if rash flares, call office and we will get her an appointment so it can be evaluated. Encouraged her to take pictures of any flares.   RASH    Return if symptoms worsen or fail to improve.  I, Roseline Hutchinson, CMA, am acting as scribe for Sneijder Bernards K, PA-C .   Documentation: I have reviewed the above documentation for accuracy and completeness, and I agree with the above.  Geran Haithcock K, PA-C

## 2024-02-11 NOTE — Patient Instructions (Signed)

## 2024-02-15 ENCOUNTER — Other Ambulatory Visit: Payer: Self-pay | Admitting: Medical Genetics

## 2024-02-15 ENCOUNTER — Telehealth: Payer: Self-pay | Admitting: Medical Genetics

## 2024-02-15 DIAGNOSIS — Z006 Encounter for examination for normal comparison and control in clinical research program: Secondary | ICD-10-CM

## 2024-02-15 NOTE — Telephone Encounter (Signed)
 [facility]  7/7/202 3:11 PM  Confirmed I was speaking with Terri [name] [phone] by using name and DOB. Informed participant the reason for this call is to follow-up on a recent sample the participant provided at one of the [facility] lab locations. Informed participant the test was not able to be completed with this sample and apologized for the inconvenience. Participant was requested to provide a new sample at one of our participating labs at no cost so that participant can continue participation and receive test results. Informed participant they do not need to be fasting and if there are other samples that need to be drawn, they can be done at the same visit. Participant has not had a blood transfusion or blood product in the last 30 days. Participant agreed to provide another sample. Participant was provided the [employer] program website to learn why this may have happened. Participant was thanked for their time and continued support of the above study.  [name], BS [place]  [facility] Clinical Research Specialist II Direct Dial: [phone]  Fax: [phone]

## 2024-02-18 ENCOUNTER — Other Ambulatory Visit

## 2024-02-18 ENCOUNTER — Other Ambulatory Visit: Payer: Self-pay | Admitting: Medical Genetics

## 2024-02-18 DIAGNOSIS — Z006 Encounter for examination for normal comparison and control in clinical research program: Secondary | ICD-10-CM

## 2024-02-18 NOTE — Progress Notes (Signed)
 SABRA

## 2024-02-18 NOTE — Assessment & Plan Note (Signed)
 A1c 6.0 on labs from December 2024.  She is currently prescribed metformin  500 mg twice daily and Mounjaro  10 mg weekly.  Repeat labs are currently pending.

## 2024-02-18 NOTE — Assessment & Plan Note (Signed)
Currently prescribed vitamin D supplementation (50,000 IU) every 3 weeks.  Repeat vitamin D level ordered today.

## 2024-02-18 NOTE — Addendum Note (Signed)
 Addended by: REMUS NOEMI PARAS on: 02/18/2024 09:46 AM   Modules accepted: Orders

## 2024-02-19 ENCOUNTER — Telehealth: Payer: Self-pay

## 2024-02-19 NOTE — Telephone Encounter (Signed)
 Appointment has been updated to completed

## 2024-02-19 NOTE — Telephone Encounter (Signed)
 Patient came by the office said she received a no show, but she came by the office and had her tb test on 02/02/2024

## 2024-02-25 ENCOUNTER — Telehealth: Payer: Self-pay

## 2024-02-25 NOTE — Telephone Encounter (Signed)
 Terri [name] is doing the paper work now, Paperwork was not in Chart[employer] loss adjuster

## 2024-02-25 NOTE — Telephone Encounter (Signed)
 TB screening form  Filled out Signed  Copied and sent to scan

## 2024-02-25 NOTE — Telephone Encounter (Signed)
 Please advise Patient checking to see if paperwork ready for pick up.

## 2024-02-27 LAB — GENECONNECT MOLECULAR SCREEN: Genetic Analysis Overall Interpretation: NEGATIVE

## 2024-03-03 ENCOUNTER — Other Ambulatory Visit (HOSPITAL_COMMUNITY): Payer: Self-pay

## 2024-03-03 ENCOUNTER — Encounter (INDEPENDENT_AMBULATORY_CARE_PROVIDER_SITE_OTHER): Payer: Self-pay | Admitting: Family Medicine

## 2024-03-03 ENCOUNTER — Ambulatory Visit (INDEPENDENT_AMBULATORY_CARE_PROVIDER_SITE_OTHER): Admitting: Family Medicine

## 2024-03-03 DIAGNOSIS — R0602 Shortness of breath: Secondary | ICD-10-CM | POA: Diagnosis not present

## 2024-03-03 DIAGNOSIS — Z6836 Body mass index (BMI) 36.0-36.9, adult: Secondary | ICD-10-CM

## 2024-03-03 DIAGNOSIS — Z7984 Long term (current) use of oral hypoglycemic drugs: Secondary | ICD-10-CM

## 2024-03-03 DIAGNOSIS — E669 Obesity, unspecified: Secondary | ICD-10-CM | POA: Diagnosis not present

## 2024-03-03 DIAGNOSIS — E559 Vitamin D deficiency, unspecified: Secondary | ICD-10-CM | POA: Diagnosis not present

## 2024-03-03 DIAGNOSIS — I10 Essential (primary) hypertension: Secondary | ICD-10-CM

## 2024-03-03 DIAGNOSIS — E1169 Type 2 diabetes mellitus with other specified complication: Secondary | ICD-10-CM | POA: Diagnosis not present

## 2024-03-03 DIAGNOSIS — Z7985 Long-term (current) use of injectable non-insulin antidiabetic drugs: Secondary | ICD-10-CM

## 2024-03-03 MED ORDER — TIRZEPATIDE 10 MG/0.5ML ~~LOC~~ SOAJ
10.0000 mg | SUBCUTANEOUS | 0 refills | Status: DC
  Filled 2024-03-03: qty 6, 84d supply, fill #0

## 2024-03-03 MED ORDER — VITAMIN D (ERGOCALCIFEROL) 1.25 MG (50000 UNIT) PO CAPS
50000.0000 [IU] | ORAL_CAPSULE | ORAL | 0 refills | Status: DC
  Filled 2024-03-03: qty 9, 90d supply, fill #0
  Filled 2024-03-03: qty 6, 60d supply, fill #0
  Filled 2024-03-21: qty 9, 90d supply, fill #0

## 2024-03-03 NOTE — Progress Notes (Incomplete)
 [name], D.O.  ABFM, ABOM Specializing in Clinical Bariatric Medicine  Office located at: [address] [place], [place]  [address]     Assessment and Plan:   Medications Discontinued During This Encounter  Medication Reason   Vitamin D , Ergocalciferol , (DRISDOL ) 1.25 MG (50000 UNIT) CAPS capsule Reorder   tirzepatide  (MOUNJARO ) 10 MG/0.5ML Pen Reorder    Meds ordered this encounter  Medications   tirzepatide  (MOUNJARO ) 10 MG/0.5ML Pen    Sig: Inject 10 mg into the skin once a week.    Dispense:  6 mL    Refill:  0   Vitamin D , Ergocalciferol , (DRISDOL ) 1.25 MG (50000 UNIT) CAPS capsule    Sig: Take 1 capsule by mouth every 10 days    Dispense:  9 capsule    Refill:  0      FOR THE DISEASE OF OBESITY: Morbid obesity (HCC)-start bmi 41.16/date 08/22/21 BMI 36.0-36.9,adult current 36.96 Assessment & Plan: Since last office visit on 02/04/24 patient's muscle mass has increased by 3.6 lbs. Fat mass has decreased by 5.8 lbs. Total body water has decreased by 1.8 lbs.  Counseling done on how various foods will affect these numbers and how to maximize success  Total lbs lost to date: 26 lbs Total weight loss percentage to date: -10.20 %   Recommended Dietary Goals Jurnei is currently in the action stage of change. As such, her goal is to continue weight management plan.  She has agreed to:  [employer] parameters -- Journal 1500-1600 calories and 95+++ grams of protein per day   Behavioral Intervention We discussed the following today: {dowtlossstrategies:31654}  Additional resources provided today: {DOhandouts:31655::None}  Evidence-based interventions for health behavior change were utilized today including the discussion of self monitoring techniques, problem-solving barriers and SMART goal setting techniques.   Regarding patient's less desirable eating habits and patterns, we employed the technique of small changes.   Pt will specifically work  on: ***    Recommended Physical Activity Goals Vertis has been advised to work up to 300-450 minutes of moderate intensity aerobic activity a week and strengthening exercises 2-3 times per week for cardiovascular health, weight loss maintenance and preservation of muscle mass.   She has agreed to: {EMEXERCISE:28847::Think about enjoyable ways to increase daily physical activity and overcoming barriers to exercise,Increase physical activity in their day and reduce sedentary time (increase NEAT).}   Pharmacotherapy We both agreed to: {EMagreedrx:29170}   ASSOCIATED CONDITIONS ADDRESSED TODAY:  Type 2 diabetes mellitus with obesity (HCC) Assessment & Plan: Lab Results  Component Value Date   HGBA1C 5.9 (H) 01/14/2024   HGBA1C 6.0 (H) 07/16/2023   HGBA1C 6.2 (H) 02/09/2023    At-home sugars wer  Taking Mounjaro  and Metformin   Constipation sometimes - but she drinks more water and exercises.     ***   SOBOE (shortness of breath on exertion) Assessment & Plan: Condition is Improving, but not optimized.SABRA Heron does feel that she easily gets out of breath when walking up two flights of stairs and this is concerning to the her.  It appears Margart Zemanek Lyne's shortness of breath continues to be obesity related and exercise induced.  she does not appear to have any red flag symptoms/ concerns today.  No chest pain, heart palpitations or dizziness.   Also, this condition appears to be improving from prior as she enhances her cardiovascular health and fitness.  Indirect Calorimeter performed today in office and interpreted by myself- see details below.  This  information is a critical component in devising her nutritional plan today.  Counseling was done with patient on these findings and on various ways to make these results more favorable for weight loss.  Indirect Calorimeter completed today to help guide our dietary regimen. It shows a VO2 of 308 and a REE of 2131.  Her  calculated basal metabolic rate is 7008 thus her measured basal metabolic rate is worse than expected. Her REE was 1541 in 08/2021.   Patient agreed to continue to work on weight loss at this time via a prudent nutritional plan and regular exercise.  As Rodina progresses, we will increase exercise intensity and frequency as tolerated to treat her current condition.   If Lasharn follows our recommendations and loses 5-10% of their weight without improvement of her shortness of breath or if at any time, symptoms become more concerning, they agree to urgently follow up with their PCP/ specialist for further consideration/ evaluation.   Georgian verbalizes agreement with this plan.   Her metabolism has overall improved and her calorie and protein parameters were adjusted. I recommend she now journal 1500-1600 calories and 95++ g of protein per day. Reviewed strategies of implementing healthy fats and grains in her diet.       Hypertension, essential Assessment & Plan: BP Readings from Last 3 Encounters:  03/03/24 115/73  02/11/24 114/74  02/04/24 117/75    BP expected to decrease with wt loss   - Keep an eye on her BP  - If under 100/60 then may need to evaluate    ***    Vitamin D  deficiency Assessment & Plan: Lab Results  Component Value Date   VD25OH 44.8 02/01/2024   VD25OH CANCELED 01/14/2024   VD25OH 42.9 07/16/2023   Currently on ERGO 50K units once weekly. Tolerating well. Labs obtained LOV reviewed; Vit D is only slightly below goal. We anticipate her vit D levels will improve with increased sun exposure during the summer.   Ideal vit D levels of 50-70 reviewed with patient. Continue supplementation at current dose. Will refill ERGO today.     ***   Follow up:   Return for f/u on 03/31/2024 at 4:00 PM.  She was informed of the importance of frequent follow up visits to maximize her success with intensive lifestyle modifications for her multiple health  conditions.  Subjective:   Chief complaint: Obesity Savanha is here to discuss her progress with her obesity treatment plan. She is keeping a food journal and adhering to recommended goals of 1000-1100 calories and 90+ grams of protein and states she is following her eating plan approximately 80% of the time. She states she is walking on the treadmill and weight lifting 60 minutes 5 days per week.  Interval History:  KENZLIE DISCH is here for a follow up office visit. Since last OV on 02/04/24, she is down 2 lbs. Brought her journal log with her today -- noticed she was able to be more mindful. She has found that even though she is eating all her foods, she is still not hungry at meal times. Her clothes have had a looser fit lately. She admits to eating some off-plan foods for 4th of July cookout/celebrations. She uses her treadmill at a 8.8 incline and 2.3 speed.  Endorses some lumbar pain still, but notes this has improved.   Pharmacotherapy that aid with weight loss: She is currently taking Metformin  500 mg BID and Mounjaro  10 mg once weekly.   Review of Systems:  Pertinent positives were addressed with patient today.  Reviewed by clinician on day of visit: allergies, medications, problem list, medical history, surgical history, family history, social history, and previous encounter notes.  Weight Summary and Biometrics   Weight Lost Since Last Visit: 2lb  Weight Gained Since Last Visit: 0lb    Vitals Temp: 98.7 F (37.1 C) BP: 115/73 Pulse Rate: 89 SpO2: 98 %   Anthropometric Measurements Height: 5' 6 (1.676 m) Weight: 229 lb (103.9 kg) BMI (Calculated): 36.98 Weight at Last Visit: 231lb Weight Lost Since Last Visit: 2lb Weight Gained Since Last Visit: 0lb Starting Weight: 255lb Total Weight Loss (lbs): 26 lb (11.8 kg) Peak Weight: 260lb   Body Composition  Body Fat %: 44.1 % Fat Mass (lbs): 101.2 lbs Muscle Mass (lbs): 122 lbs Total Body Water (lbs): 86.8  lbs Visceral Fat Rating : 13   Other Clinical Data RMR: 2131 Fasting: Yes Labs: Yes Today's Visit #: 34 Starting Date: 08/22/21 Comments: 1000-110/90+    Objective:   PHYSICAL EXAM: Blood pressure 115/73, pulse 89, temperature 98.7 F (37.1 C), height 5' 6 (1.676 m), weight 229 lb (103.9 kg), SpO2 98%. Body mass index is 36.96 kg/m.  General: she is overweight, cooperative and in no acute distress. PSYCH: Has normal mood, affect and thought process.   HEENT: EOMI, sclerae are anicteric. Lungs: Normal breathing effort, no conversational dyspnea. Extremities: Moves * 4 Neurologic: A and O * 3, good insight  DIAGNOSTIC DATA REVIEWED: BMET    Component Value Date/Time   NA 138 01/14/2024 0827   K 4.3 01/14/2024 0827   CL 100 01/14/2024 0827   CO2 23 01/14/2024 0827   GLUCOSE 88 01/14/2024 0827   GLUCOSE 125 (H) 11/28/2017 2010   BUN 18 01/14/2024 0827   CREATININE 0.74 01/14/2024 0827   CALCIUM  9.9 01/14/2024 0827   GFRNONAA >60 11/28/2017 2010   GFRAA >60 11/28/2017 2010   Lab Results  Component Value Date   HGBA1C 5.9 (H) 01/14/2024   HGBA1C 7.4 (H) 02/28/2021   No results found for: INSULIN  Lab Results  Component Value Date   TSH 1.140 05/01/2023   CBC    Component Value Date/Time   WBC 4.7 05/01/2023 1602   WBC 6.6 11/28/2017 2010   RBC 4.01 05/01/2023 1602   RBC 4.31 11/28/2017 2010   HGB 11.8 05/01/2023 1602   HCT 37.2 05/01/2023 1602   PLT 225 05/01/2023 1602   MCV 93 05/01/2023 1602   MCH 29.4 05/01/2023 1602   MCH 29.0 11/28/2017 2010   MCHC 31.7 05/01/2023 1602   MCHC 30.9 11/28/2017 2010   RDW 12.9 05/01/2023 1602   Iron Studies No results found for: IRON, TIBC, FERRITIN, IRONPCTSAT Lipid Panel     Component Value Date/Time   CHOL 141 01/14/2024 0827   TRIG 97 01/14/2024 0827   HDL 62 01/14/2024 0827   CHOLHDL 2.3 01/14/2024 0827   LDLCALC 61 01/14/2024 0827   Hepatic Function Panel     Component Value Date/Time    PROT 7.2 01/14/2024 0827   ALBUMIN 4.7 01/14/2024 0827   AST 20 01/14/2024 0827   ALT 20 01/14/2024 0827   ALKPHOS 56 01/14/2024 0827   BILITOT 0.2 01/14/2024 0827   BILIDIR 0.2 03/24/2016 0650   IBILI 0.5 03/24/2016 0650      Component Value Date/Time   TSH 1.140 05/01/2023 1602   Nutritional Lab Results  Component Value Date   VD25OH 44.8 02/01/2024   VD25OH CANCELED 01/14/2024   VD25OH  42.9 07/16/2023    Attestations:   LILLETTE Vernell Forest, acting as a medical scribe for Barnie Jenkins, DO., have compiled all relevant documentation for today's office visit on behalf of Barnie Jenkins, DO, while in the presence of [employer], DO.  I have reviewed the above documentation for accuracy and completeness, and I agree with the above. Barnie JINNY Jenkins, D.O.  The 21st Century Cures Act was signed into law in 2016 which includes the topic of electronic health records.  This provides immediate access to information in MyChart.  This includes consultation notes, operative notes, office notes, lab results and pathology reports.  If you have any questions about what you read please let us  know at your next visit so we can discuss your concerns and take corrective action if need be.  We are right here with you.

## 2024-03-06 ENCOUNTER — Other Ambulatory Visit: Payer: Self-pay | Admitting: Internal Medicine

## 2024-03-06 DIAGNOSIS — I10 Essential (primary) hypertension: Secondary | ICD-10-CM

## 2024-03-18 ENCOUNTER — Encounter: Payer: Self-pay | Admitting: Obstetrics

## 2024-03-18 ENCOUNTER — Ambulatory Visit (INDEPENDENT_AMBULATORY_CARE_PROVIDER_SITE_OTHER): Admitting: Obstetrics

## 2024-03-18 ENCOUNTER — Ambulatory Visit: Admitting: Obstetrics

## 2024-03-18 VITALS — BP 108/73 | HR 102 | Ht 64.37 in | Wt 234.0 lb

## 2024-03-18 DIAGNOSIS — N952 Postmenopausal atrophic vaginitis: Secondary | ICD-10-CM | POA: Diagnosis not present

## 2024-03-18 DIAGNOSIS — R351 Nocturia: Secondary | ICD-10-CM

## 2024-03-18 DIAGNOSIS — K5909 Other constipation: Secondary | ICD-10-CM

## 2024-03-18 DIAGNOSIS — Z6839 Body mass index (BMI) 39.0-39.9, adult: Secondary | ICD-10-CM

## 2024-03-18 DIAGNOSIS — N3946 Mixed incontinence: Secondary | ICD-10-CM | POA: Diagnosis not present

## 2024-03-18 LAB — POCT URINALYSIS DIP (CLINITEK)
Bilirubin, UA: NEGATIVE
Blood, UA: NEGATIVE
Glucose, UA: NEGATIVE mg/dL
Ketones, POC UA: NEGATIVE mg/dL
Leukocytes, UA: NEGATIVE
Nitrite, UA: NEGATIVE
POC PROTEIN,UA: NEGATIVE
Spec Grav, UA: 1.025 (ref 1.010–1.025)
Urobilinogen, UA: 0.2 U/dL
pH, UA: 5 (ref 5.0–8.0)

## 2024-03-18 MED ORDER — GEMTESA 75 MG PO TABS: 75.0000 mg | ORAL_TABLET | Freq: Every day | ORAL | 2 refills | Status: DC

## 2024-03-18 MED ORDER — MIRABEGRON ER 25 MG PO TB24: 25.0000 mg | ORAL_TABLET | Freq: Every day | ORAL | 0 refills | Status: DC

## 2024-03-18 MED ORDER — ESTRADIOL 0.1 MG/GM VA CREA: TOPICAL_CREAM | VAGINAL | 3 refills | Status: DC

## 2024-03-18 MED ORDER — GEMTESA 75 MG PO TABS
75.0000 mg | ORAL_TABLET | Freq: Every day | ORAL | Status: DC
Start: 2024-03-18 — End: 2024-03-18

## 2024-03-18 NOTE — Assessment & Plan Note (Signed)
-   For symptomatic vaginal atrophy options include lubrication with a water-based lubricant, personal hygiene measures and barrier protection against wetness, and estrogen replacement in the form of vaginal cream, vaginal tablets, or a time-released vaginal ring.   - Rx low dose vaginal estrogen - discussed proper vulvar care, warm compression, avoid pad use, cotton only underwear and barrier ointment if needed  - encouraged Vit E suppository, moisturizer with Replens/Revaree

## 2024-03-18 NOTE — Progress Notes (Addendum)
 New Patient Evaluation and Consultation  Referring Provider: Melvenia Manus BRAVO, MD PCP: Bevely Doffing, FNP Date of Service: 03/18/2024  SUBJECTIVE Chief Complaint: New Patient (Initial Visit) Blanchett MATHEW STORCK is a 58 y.o. female here today for female organ prolapse.)  History of Present Illness: GENESIA CASLIN is a 58 y.o. Black or African-American female seen in consultation at the request of Dr Melvenia for evaluation of pelvic organ prolapse.    Vaginal bulge slightly past vaginal opening started around 6 months ago with pressure sensation, splints for comfort 1x/week and reports worsening symptoms.  Denies changes in medication, surgery, falls, constipation. Denies bleeding, pain or discharge.  Mounjaro  and Metformin  500mg  BID for T2DM, HbA1c 5.9 in 01/14/24 Exercises 3x/week with 80-85oz fluid intake Miralax  for constipation 1x/week Followed by healthy weight and wellness, started out at 280lb and now 234lb  Review of records significant for: HTN  Urinary Symptoms: Leaks urine with cough/ sneeze, laughing, lifting, going from sitting to standing, with a full bladder, with movement to the bathroom, with urgency, and while asleep started 4-5 months ago Leaks 3 time(s) per days with urgency, more bothersome Now leaks 1x/week at night with urgency and feels like baby is on her bladder Leaks 1x/week with cough/sneeze with small volume leakage Pad use: 2 liners/ mini-pads per day.   Patient is bothered by UI symptoms.  Day time voids 10 due to increased fluid intake, previously 4-6x/day.  Nocturia: 4-6 times per night to void with lower extremity edema. Denies compression sock use Takes cup of water to bed  Children reports snoring, denies sleep apnea Voiding dysfunction:  empties bladder well.  Patient does not use a catheter to empty bladder.  When urinating, patient feels dribbling after finishing and the need to urinate multiple times in a row Drinks: 80-85oz water per  day, intermittent pepsi  UTIs: 0 UTI's in the last year.   Denies history of blood in urine, kidney or bladder stones, pyelonephritis, bladder cancer, and kidney cancer No results found for the last 90 days.   Pelvic Organ Prolapse Symptoms:                  Patient Admits to a feeling of a bulge the vaginal area. It has been present for 6 months to 1 years.  Patient Admits to seeing a bulge.  This bulge is not bothersome.  Bowel Symptom: Bowel movements: 2-3 time(s) per day Reports history of constipation, previously manage by juice or vegetables Stool consistency: soft , Bristol II-III Straining: yes.  Splinting: no.  Incomplete evacuation: no.  Patient Denies accidental bowel leakage / fecal incontinence Bowel regimen: stool softener and miralax  PRN 1x/month Last colonoscopy: Cologuard 03/22/21 negative  [facility] Colonoscopy   This patient has no relevant Health Maintenance data.     Sexual Function Sexually active: yes.  Sexual orientation: Straight Pain with sex: No  Pelvic Pain Denies pelvic pain   Past Medical History:  Past Medical History:  Diagnosis Date   Arthritis    right hip and knee pain; had right ankle fusion   Back pain    Diabetes mellitus without complication (HCC)    Edema, lower extremity    Hip pain    Hyperlipidemia    Hypertension    Joint pain    Knee pain      Past Surgical History:   Past Surgical History:  Procedure Laterality Date   ANKLE FUSION Right    APPENDECTOMY     with bilateral oophorectomy  at 58yo around 6 weeks after TVH   CHOLECYSTECTOMY N/A 03/21/2016   Procedure: LAPAROSCOPIC CHOLECYSTECTOMY;  Surgeon: Oneil Budge, MD;  Location: [facility];  Service: General;  Laterality: N/A;   VAGINAL HYSTERECTOMY     in her 30s     Past OB/GYN History: OB History  Gravida Para Term Preterm AB Living  3 3 2 1  3   SAB IAB Ectopic Multiple Live Births      3    # Outcome Date GA Lbr Len/2nd Weight Sex Type Anes PTL Lv  3 Term      F Vag-Spont   LIV  2 Preterm     F Vag-Spont   LIV  1 Term     M Vag-Forceps   LIV    Vaginal deliveries: largest infant 9lbs, denies UI or FI postpartum Forceps/ Vacuum deliveries: 1 forceps, episiotomy. Cesarean section: 0 Menopausal: Yes, at age 58, Denies vaginal bleeding since menopause Contraception: s/p TVH for fibroid. Last pap smear was 1992.  Any history of abnormal pap smears: no. No results found for: DIAGPAP, HPVHIGH, ADEQPAP  Medications: Patient has a current medication list which includes the following prescription(s): amlodipine , cyanocobalamin , [START ON 03/21/2024] estradiol , onetouch verio, metformin , onetouch ultra, polyethylene glycol powder, rosuvastatin , tirzepatide , valsartan -hydrochlorothiazide , gemtesa , gemtesa , and vitamin d  (ergocalciferol ).   Allergies: Patient is allergic to penicillins.   Social History:  Social History   Tobacco Use   Smoking status: Never   Smokeless tobacco: Never  Vaping Use   Vaping status: Never Used  Substance Use Topics   Alcohol use: No   Drug use: No    Relationship status: divorced Patient lives by herself.   Patient is employed as a Systems developer. Regular exercise: Yes: treadmill, pool, bike 3x/week for 1 hour History of abuse: No  Family History:   Family History  Problem Relation Age of Onset   Diabetes Mother    High blood pressure Mother    Stroke Mother    Obesity Mother    Sudden death Father    Uterine cancer Neg Hx    Bladder Cancer Neg Hx    Renal cancer Neg Hx      Review of Systems: Review of Systems  Constitutional:  Positive for weight loss. Negative for fever and malaise/fatigue.  Respiratory:  Negative for cough, shortness of breath and wheezing.   Cardiovascular:  Positive for leg swelling. Negative for chest pain and palpitations.  Gastrointestinal:  Negative for abdominal pain, blood in stool and constipation.  Genitourinary:  Positive for frequency and urgency. Negative for  dysuria and hematuria.       Bulge, leakage  Skin:  Negative for rash.  Neurological:  Negative for dizziness, weakness and headaches.  Endo/Heme/Allergies:  Does not bruise/bleed easily.       Hot flashes  Psychiatric/Behavioral:  Negative for depression. The patient is not nervous/anxious.      OBJECTIVE Physical Exam: Vitals:   03/18/24 1111  BP: 108/73  Pulse: (!) 102  Weight: 234 lb (106.1 kg)  Height: 5' 4.37 (1.635 m)    Physical Exam Constitutional:      General: She is not in acute distress.    Appearance: Normal appearance.  Genitourinary:     Bladder and urethral meatus normal.     No lesions in the vagina.     Right Labia: No rash, tenderness, lesions, skin changes or Bartholin's cyst.    Left Labia: No tenderness, lesions, skin changes, Bartholin's cyst or rash.  No vaginal discharge, erythema, tenderness, bleeding, ulceration or granulation tissue.      Right Adnexa: not tender, not full and no mass present.    Left Adnexa: not tender, not full and no mass present.    Cervix is absent.     Uterus is absent.     Urethral meatus caruncle not present.    No urethral prolapse, tenderness, mass, hypermobility or discharge present.     Bladder is not tender, urgency on palpation not present and masses not present.      Levator ani not tender, obturator internus not tender, no asymmetrical contractions present and no pelvic spasms present.    Anal wink present and BC reflex present. Cardiovascular:     Rate and Rhythm: Normal rate.  Pulmonary:     Effort: Pulmonary effort is normal. No respiratory distress.  Abdominal:     General: There is no distension.     Palpations: Abdomen is soft. There is no mass.     Tenderness: There is no abdominal tenderness.     Hernia: No hernia is present.   Neurological:     Mental Status: She is alert.  Vitals reviewed. Exam conducted with a chaperone present.      POP-Q:   POP-Q  3                                             Aa   3                                           Ba  3                                              C   5                                            Gh  4                                            Pb  7                                            tvl   -3                                            Ap  -3                                            Bp  D    Post-Void Residual (PVR) by Bladder Scan: In order to evaluate bladder emptying, we discussed obtaining a postvoid residual and patient agreed to this procedure.  Procedure: The ultrasound unit was placed on the patient's abdomen in the suprapubic region after the patient had voided.    Post Void Residual - 03/18/24 1140       Post Void Residual   Post Void Residual 57 mL           Laboratory Results: Lab Results  Component Value Date   COLORU yellow 03/18/2024   CLARITYU clear 03/18/2024   GLUCOSEUR negative 03/18/2024   BILIRUBINUR negative 03/18/2024   SPECGRAV 1.025 03/18/2024   RBCUR negative 03/18/2024   PHUR 5.0 03/18/2024   PROTEINUR 100 (A) 11/28/2017   UROBILINOGEN 0.2 03/18/2024   LEUKOCYTESUR Negative 03/18/2024    Lab Results  Component Value Date   CREATININE 0.74 01/14/2024   CREATININE 0.63 05/01/2023   CREATININE 0.73 03/18/2022    Lab Results  Component Value Date   HGBA1C 5.9 (H) 01/14/2024    Lab Results  Component Value Date   HGB 11.8 05/01/2023     ASSESSMENT AND PLAN Ms. Addair is a 58 y.o. with:  1. Other constipation   2. BMI 39.0-39.9,adult   3. Nocturia   4. Mixed stress and urge urinary incontinence   5. Vaginal atrophy     Other constipation Assessment & Plan: - Bristol II-III 2-3x/day with straining, miralax  PRN 1x/week - For constipation, we reviewed the importance of a better bowel regimen.  We also discussed the importance of avoiding chronic straining, as it can exacerbate her pelvic  floor symptoms; we discussed treating constipation and straining prior to surgery, as postoperative straining can lead to damage to the repair and recurrence of symptoms. We discussed initiating therapy with increasing fluid intake, fiber supplementation, stool softeners, and laxatives such as miralax .  - encouraged squatting position for defecation and titration of fiber supplementation or miralax  to reduce straining   BMI 39.0-39.9,adult Assessment & Plan: - discussed association with pelvic floor dysfunction - encouraged pt to continue weight reduction and follow-up with healthy weight and wellness   Nocturia Assessment & Plan: - 4-6x/night - avoid fluid intake 3 hours before bedtime - elevated your during the day or use compression socks to reduce lower extremity swelling - switch your Diovan -hydrochlorothiazide  dosing to 2pm - due to snoring, consider sleep study to r/o sleep apnea - trial of Gemtesa     Orders: -     POCT URINALYSIS DIP (CLINITEK) -     Gemtesa ; Take 1 tablet (75 mg total) by mouth daily. LOT: [id number] EXP: 05/2026  Dispense: 14 tablet -     Gemtesa ; Take 1 tablet (75 mg total) by mouth daily.  Dispense: 30 tablet; Refill: 2  Mixed stress and urge urinary incontinence Assessment & Plan: - POCT UA negative, PVR 57mL - urgency > stress - We discussed the symptoms of overactive bladder (OAB), which include urinary urgency, urinary frequency, nocturia, with or without urge incontinence.  While we do not know the exact etiology of OAB, several treatment options exist. We discussed management including behavioral therapy (decreasing bladder irritants, urge suppression strategies, timed voids, bladder retraining), physical therapy, medication; for refractory cases posterior tibial nerve stimulation, sacral neuromodulation, and intravesical botulinum toxin injection.  For anticholinergic medications, we discussed the potential side effects of anticholinergics including dry  eyes, dry mouth, constipation, cognitive impairment and urinary retention. For Beta-3 agonist medication, we discussed the  potential side effect of elevated blood pressure which is more likely to occur in individuals with uncontrolled hypertension. - trial of gemtesa  with samples and Rx - encouraged fluid management and continue weight loss - For treatment of stress urinary incontinence,  non-surgical options include expectant management, weight loss, physical therapy, as well as a pessary.  Surgical options include a midurethral sling, Burch urethropexy, and transurethral injection of a bulking agent. - optimize stool consistency  - continue to monitor glycemic control, HbA1C 5.9 in 01/14/24  Orders: -     Gemtesa ; Take 1 tablet (75 mg total) by mouth daily. LOT: [id number] EXP: 05/2026  Dispense: 14 tablet -     Gemtesa ; Take 1 tablet (75 mg total) by mouth daily.  Dispense: 30 tablet; Refill: 2  Vaginal atrophy Assessment & Plan: - For symptomatic vaginal atrophy options include lubrication with a water-based lubricant, personal hygiene measures and barrier protection against wetness, and estrogen replacement in the form of vaginal cream, vaginal tablets, or a time-released vaginal ring.   - Rx low dose vaginal estrogen - discussed proper vulvar care, warm compression, avoid pad use, cotton only underwear and barrier ointment if needed  - encouraged Vit E suppository, moisturizer with Replens/Revaree  Orders: -     Estradiol ; Place 0.5g nightly for two weeks then twice a week after  Dispense: 30 g; Refill: 3  Time spent: I spent 67 minutes dedicated to the care of this patient on the date of this encounter to include pre-visit review of records, face-to-face time with the patient discussing stage III pelvic organ prolapse, mixed urinary incontinence, nocturia, vaginal atrophy, constipation, BMI, and post visit documentation and ordering medication/ testing.   Lianne ONEIDA Gillis, MD

## 2024-03-18 NOTE — Assessment & Plan Note (Signed)
-   4-6x/night - avoid fluid intake 3 hours before bedtime - elevated your during the day or use compression socks to reduce lower extremity swelling - switch your Diovan -hydrochlorothiazide  dosing to 2pm - due to snoring, consider sleep study to r/o sleep apnea - trial of Gemtesa

## 2024-03-18 NOTE — Assessment & Plan Note (Signed)
-   Bristol II-III 2-3x/day with straining, miralax  PRN 1x/week - For constipation, we reviewed the importance of a better bowel regimen.  We also discussed the importance of avoiding chronic straining, as it can exacerbate her pelvic floor symptoms; we discussed treating constipation and straining prior to surgery, as postoperative straining can lead to damage to the repair and recurrence of symptoms. We discussed initiating therapy with increasing fluid intake, fiber supplementation, stool softeners, and laxatives such as miralax .  - encouraged squatting position for defecation and titration of fiber supplementation or miralax  to reduce straining

## 2024-03-18 NOTE — Assessment & Plan Note (Signed)
-   For treatment of pelvic organ prolapse, we discussed options for management including expectant management, conservative management, and surgical management, such as Kegels, a pessary, pelvic floor physical therapy, and specific surgical procedures. - encouraged to continue weight reduction and Kegel exercises

## 2024-03-18 NOTE — Assessment & Plan Note (Signed)
-   discussed association with pelvic floor dysfunction - encouraged pt to continue weight reduction and follow-up with healthy weight and wellness

## 2024-03-18 NOTE — Addendum Note (Signed)
 Addended by: Magie Ciampa T on: 03/18/2024 04:30 PM   Modules accepted: Orders

## 2024-03-18 NOTE — Assessment & Plan Note (Addendum)
-   POCT UA negative, PVR 57mL - urgency > stress - We discussed the symptoms of overactive bladder (OAB), which include urinary urgency, urinary frequency, nocturia, with or without urge incontinence.  While we do not know the exact etiology of OAB, several treatment options exist. We discussed management including behavioral therapy (decreasing bladder irritants, urge suppression strategies, timed voids, bladder retraining), physical therapy, medication; for refractory cases posterior tibial nerve stimulation, sacral neuromodulation, and intravesical botulinum toxin injection.  For anticholinergic medications, we discussed the potential side effects of anticholinergics including dry eyes, dry mouth, constipation, cognitive impairment and urinary retention. For Beta-3 agonist medication, we discussed the potential side effect of elevated blood pressure which is more likely to occur in individuals with uncontrolled hypertension. - trial of gemtesa  with samples and Rx. Rx changed to Mirabegron  due to cost - encouraged fluid management and continue weight loss - For treatment of stress urinary incontinence,  non-surgical options include expectant management, weight loss, physical therapy, as well as a pessary.  Surgical options include a midurethral sling, Burch urethropexy, and transurethral injection of a bulking agent. - optimize stool consistency  - continue to monitor glycemic control, HbA1C 5.9 in 01/14/24

## 2024-03-18 NOTE — Patient Instructions (Addendum)
 We discussed the symptoms of overactive bladder (OAB), which include urinary urgency, urinary frequency, night-time urination, with or without urge incontinence.  We discussed management including behavioral therapy (decreasing bladder irritants by following a bladder diet, urge suppression strategies, timed voids, bladder retraining), physical therapy, medication; and for refractory cases posterior tibial nerve stimulation, sacral neuromodulation, and intravesical botulinum toxin injection.   For Beta-3 agonist medication, we discussed the potential side effect of elevated blood pressure which is more likely to occur in individuals with uncontrolled hypertension. You were given samples for Gemtesa  75 mg.  It can take a month to start working so give it time, but if you have bothersome side effects call sooner and we can try a different medication.  Call us  if you have trouble filling the prescription or if it's not covered by your insurance.  For treatment of stress urinary incontinence, which is leakage with physical activity/movement/strainging/coughing, we discussed expectant management versus nonsurgical options versus surgery. Nonsurgical options include weight loss, physical therapy, as well as a pessary.  Surgical options include a midurethral sling, which is a synthetic mesh sling that acts like a hammock under the urethra to prevent leakage of urine, a Burch urethropexy, and transurethral injection of a bulking agent.   You have a stage 3 (out of 4) prolapse.  We discussed the fact that it is not life threatening but there are several treatment options. For treatment of pelvic organ prolapse, we discussed options for management including expectant management, conservative management, and surgical management, such as Kegels, a pessary, pelvic floor physical therapy, and specific surgical procedures.     Constipation: Our goal is to achieve formed bowel movements daily or every-other-day.  You may  need to try different combinations of the following options to find what works best for you - everybody's body works differently so feel free to adjust the dosages as needed.  Some options to help maintain bowel health include:  Dietary changes (more leafy greens, vegetables and fruits; less processed foods) Fiber supplementation (Benefiber, FiberCon, Metamucil or Psyllium). Start slow and increase gradually to full dose. Over-the-counter agents such as: stool softeners (Docusate or Colace) and/or laxatives (Miralax , milk of magnesia)  Power Pudding is a natural mixture that may help your constipation.  To make blend 1 cup applesauce, 1 cup wheat bran, and 3/4 cup prune juice, refrigerate and then take 1 tablespoon daily with a large glass of water as needed.   Women should try to eat at least 21 to 25 grams of fiber a day, while men should aim for 30 to 38 grams a day. You can add fiber to your diet with food or a fiber supplement such as psyllium (metamucil), benefiber, or fibercon.   Here's a look at how much dietary fiber is found in some common foods. When buying packaged foods, check the Nutrition Facts label for fiber content. It can vary among brands.  Fruits Serving size Total fiber (grams)*  Raspberries 1 cup 8.0  Pear 1 medium 5.5  Apple, with skin 1 medium 4.5  Banana 1 medium 3.0  Orange 1 medium 3.0  Strawberries 1 cup 3.0   Vegetables Serving size Total fiber (grams)*  Green peas, boiled 1 cup 9.0  Broccoli, boiled 1 cup chopped 5.0  Turnip greens, boiled 1 cup 5.0  Brussels sprouts, boiled 1 cup 4.0  Potato, with skin, baked 1 medium 4.0  Sweet corn, boiled 1 cup 3.5  Cauliflower, raw 1 cup chopped 2.0  Carrot, raw 1 medium  1.5   Grains Serving size Total fiber (grams)*  Spaghetti, whole-wheat, cooked 1 cup 6.0  Barley, pearled, cooked 1 cup 6.0  Bran flakes 3/4 cup 5.5  Quinoa, cooked 1 cup 5.0  Oat bran muffin 1 medium 5.0  Oatmeal, instant, cooked 1 cup 5.0   Popcorn, air-popped 3 cups 3.5  Brown rice, cooked 1 cup 3.5  Bread, whole-wheat 1 slice 2.0  Bread, rye 1 slice 2.0   Legumes, nuts and seeds Serving size Total fiber (grams)*  Split peas, boiled 1 cup 16.0  Lentils, boiled 1 cup 15.5  Black beans, boiled 1 cup 15.0  Baked beans, canned 1 cup 10.0  Chia seeds 1 ounce 10.0  Almonds 1 ounce (23 nuts) 3.5  Pistachios 1 ounce (49 nuts) 3.0  Sunflower kernels 1 ounce 3.0  *Rounded to nearest 0.5 gram. Source: [employer] for [employer], Legacy Release    For vaginal atrophy (thinning of the vaginal tissue that can cause dryness and burning) and UTI prevention we discussed estrogen replacement in the form of vaginal cream.   Start vaginal estrogen therapy nightly for two weeks then 2 times weekly at night. This can be placed with your finger or an applicator inside the vagina and around the urethra.  Please let us  know if the prescription is too expensive and we can look for alternative options.   Is vaginal estrogen therapy safe for me? Vaginal estrogen preparations act on the vaginal skin, and only a very tiny amount is absorbed into the bloodstream (0.01%).  They work in a similar way to hand or face cream.  There is minimal absorption and they are therefore perfectly safe. If you have had breast cancer and have persistent troublesome symptoms which aren't settling with vaginal moisturisers and lubricants, local estrogen treatment may be a possibility, but consultation with your oncologist should take place first.

## 2024-03-21 ENCOUNTER — Other Ambulatory Visit (HOSPITAL_COMMUNITY): Payer: Self-pay

## 2024-03-21 ENCOUNTER — Other Ambulatory Visit: Payer: Self-pay

## 2024-03-27 ENCOUNTER — Other Ambulatory Visit: Payer: Self-pay | Admitting: Internal Medicine

## 2024-03-27 DIAGNOSIS — I10 Essential (primary) hypertension: Secondary | ICD-10-CM

## 2024-03-28 ENCOUNTER — Ambulatory Visit: Admitting: Obstetrics

## 2024-03-29 ENCOUNTER — Other Ambulatory Visit: Payer: Self-pay | Admitting: Internal Medicine

## 2024-03-29 DIAGNOSIS — I10 Essential (primary) hypertension: Secondary | ICD-10-CM

## 2024-03-31 ENCOUNTER — Ambulatory Visit (INDEPENDENT_AMBULATORY_CARE_PROVIDER_SITE_OTHER): Admitting: Family Medicine

## 2024-04-18 ENCOUNTER — Other Ambulatory Visit (HOSPITAL_COMMUNITY): Payer: Self-pay

## 2024-04-19 ENCOUNTER — Other Ambulatory Visit: Payer: Self-pay | Admitting: Obstetrics

## 2024-04-19 DIAGNOSIS — N3946 Mixed incontinence: Secondary | ICD-10-CM

## 2024-04-19 DIAGNOSIS — R351 Nocturia: Secondary | ICD-10-CM

## 2024-04-20 ENCOUNTER — Other Ambulatory Visit (HOSPITAL_COMMUNITY): Payer: Self-pay

## 2024-04-22 ENCOUNTER — Other Ambulatory Visit: Payer: Self-pay

## 2024-04-25 ENCOUNTER — Other Ambulatory Visit: Payer: Self-pay

## 2024-04-25 ENCOUNTER — Telehealth: Payer: Self-pay

## 2024-04-25 DIAGNOSIS — Z23 Encounter for immunization: Secondary | ICD-10-CM

## 2024-04-25 MED ORDER — UNABLE TO FIND: 0 refills | Status: AC

## 2024-04-25 NOTE — Telephone Encounter (Signed)
 Faxed

## 2024-04-25 NOTE — Telephone Encounter (Signed)
 Copied from CRM [id number]. Topic: Clinical - Prescription Issue >> Apr 22, 2024  2:36 PM Geneva B wrote: Reason for CRM: pt is calling needing rx for covid send to [id number] - [place], [place] - [address] AT [facility] [address] [place] [address] Phone: [phone] Fax: [phone]

## 2024-04-28 ENCOUNTER — Encounter (INDEPENDENT_AMBULATORY_CARE_PROVIDER_SITE_OTHER): Payer: Self-pay | Admitting: Family Medicine

## 2024-04-28 ENCOUNTER — Ambulatory Visit (INDEPENDENT_AMBULATORY_CARE_PROVIDER_SITE_OTHER): Admitting: Family Medicine

## 2024-04-28 DIAGNOSIS — Z7985 Long-term (current) use of injectable non-insulin antidiabetic drugs: Secondary | ICD-10-CM

## 2024-04-28 DIAGNOSIS — K5903 Drug induced constipation: Secondary | ICD-10-CM | POA: Diagnosis not present

## 2024-04-28 DIAGNOSIS — Z7984 Long term (current) use of oral hypoglycemic drugs: Secondary | ICD-10-CM

## 2024-04-28 DIAGNOSIS — E559 Vitamin D deficiency, unspecified: Secondary | ICD-10-CM

## 2024-04-28 DIAGNOSIS — Z6836 Body mass index (BMI) 36.0-36.9, adult: Secondary | ICD-10-CM

## 2024-04-28 DIAGNOSIS — E1169 Type 2 diabetes mellitus with other specified complication: Secondary | ICD-10-CM

## 2024-04-28 MED ORDER — CYANOCOBALAMIN 500 MCG PO TABS: 500.0000 ug | ORAL_TABLET | Freq: Every day | ORAL | Status: AC

## 2024-04-28 NOTE — Progress Notes (Signed)
 Terri [name], D.O.  ABFM, ABOM Specializing in Clinical Bariatric Medicine  Office located at: [address] [name], [place]  [address]   Assessment and Plan:   Medications Discontinued During This Encounter  Medication Reason   cyanocobalamin  (VITAMIN B12) 500 MCG tablet Reorder     Meds ordered this encounter  Medications   cyanocobalamin  (VITAMIN B12) 500 MCG tablet    Sig: Take 1 tablet (500 mcg total) by mouth daily.      FOR THE DISEASE OF OBESITY:  Morbid obesity (HCC)-start bmi 41.16/date 08/22/21 BMI 36.0-36.9,adult current 36.98 Assessment & Plan: Since last office visit on 03/03/24 patient's muscle mass has decreased by 10 lbs. Fat mass has increased by 10 lbs. Total body water has increased by 6.2 lbs.  Body fat % has increased by 4.4 %. Counseling done on how various foods will affect these numbers and how to maximize success  Total lbs lost to date: - 26 lbs Total weight loss percentage to date: - 10.20 %  - Tracking Calories/Macros: yes  - Eating More Whole Foods: yes  - Adequate Protein Intake: yes  - Adequate Water Intake: yes  - Skipping Meals: no   - Sleeping 7-9 Hours/ Night: no - caretaker stress   Recommended Dietary Goals Terri [name] is currently in the action stage of change. As such, her goal is to continue weight management plan.  She has agreed to: Switch to Journaling 1350-1450 calories and 90++ grams of protein per day.   Behavioral Intervention We discussed the following today: continue to work on implementation of reduced calorie nutritional plan and continue to work on maintaining a reduced calorie state, getting the recommended amount of protein, incorporating whole foods, making healthy choices, staying well hydrated and practicing mindfulness when eating.non-scale victories  Additional resources provided today: Handout on hidden calories  and Handout on Daily Food Journaling Log  Evidence-based interventions for health  behavior change were utilized today including the discussion of self monitoring techniques, problem-solving barriers and SMART goal setting techniques.   Regarding patient's less desirable eating habits and patterns, we employed the technique of small changes.   Goal(s) for next OV: Journal everything    Recommended Physical Activity Goals Terri [name] has been advised to work up to 300-450 minutes of moderate intensity aerobic activity a week and strengthening exercises 2-3 times per week for cardiovascular health, weight loss maintenance and preservation of muscle mass.   She has agreed to: Think about enjoyable ways to increase daily physical activity and overcoming barriers to exercise and Increase physical activity in their day and reduce sedentary time (increase NEAT).   Pharmacotherapy We both agreed to: Continue with current nutritional and behavioral strategies and medication of Metformin  and Mounjaro    ASSOCIATED CONDITIONS ADDRESSED TODAY:  Type 2 diabetes mellitus with obesity (HCC) Drug induced constipation Assessment & Plan Lab Results  Component Value Date   HGBA1C 5.9 (H) 01/14/2024   HGBA1C 6.0 (H) 07/16/2023   HGBA1C 6.2 (H) 02/09/2023    On metformin  500 mg BID and Mounjaro  10 mg weekly. With good compliance and tolerance. No concerns today. Pt states that hunger and cravings are well controlled. Pt denies wanting to go up on Mounjaro  dose. Continue taking. Increase water intake and fiber gummies to help with constipation. Increase exercise as tolerated.    Vitamin D  deficiency Assessment & Plan Lab Results  Component Value Date   VD25OH 44.8 02/01/2024   VD25OH CANCELED 01/14/2024   VD25OH 42.9 07/16/2023  Ergo  50K units once weekly. Good compliance and tolerance. No acute concerns. Continue taking.     Follow up:   Return 05/26/2024 at 3:00 PM  She was informed of the importance of frequent follow up visits to maximize her success with intensive lifestyle  modifications for her multiple health conditions.    Subjective:    Chief complaint: Obesity Terri [name] is here to discuss her progress with her obesity treatment plan. She is Journaling 1500-1600 calories and 95+++ grams of protein per day   and states she is following her eating plan approximately 80% of the time. Pt is walking on treadmill 30 minutes 3-4 days per week    Interval History:  Terri [name] is here for a follow up office visit. Pt has stayed the same weight since last OV on 03/03/24. Pt states that she has been taking care of her mom and been eating stuff that is just laying around. This has caused her tracking to be thrown off a little but has pre-made meals for the week to help even it out. She usually averages 1107 to 1445 calories and 80-135 g of water.     Pharmacotherapy that aid with weight loss: She is currently taking Metformin  500 mg BID and Mounjaro  10 mg once weekly.    Review of Systems:  Pertinent positives were addressed with patient today.  Reviewed by clinician on day of visit: allergies, medications, problem list, medical history, surgical history, family history, social history, and previous encounter notes.  Weight Summary and Biometrics   Weight Lost Since Last Visit: 0  Weight Gained Since Last Visit: 0   Vitals Temp: 98 F (36.7 C) BP: 123/71 Pulse Rate: 82 SpO2: 99 %   Anthropometric Measurements Height: 5' 6 (1.676 m) Weight: 229 lb (103.9 kg) BMI (Calculated): 36.98 Weight at Last Visit: 229lb Weight Lost Since Last Visit: 0 Weight Gained Since Last Visit: 0 Starting Weight: 255lb Total Weight Loss (lbs): 26 lb (11.8 kg) Peak Weight: 260lb   Body Composition  Body Fat %: 48.5 % Fat Mass (lbs): 111.2 lbs Muscle Mass (lbs): 112 lbs Total Body Water (lbs): 93 lbs Visceral Fat Rating : 14   Other Clinical Data Fasting: no Labs: no Today's Visit #: 35 Starting Date: 08/22/21    Objective:   PHYSICAL  EXAM: Blood pressure 123/71, pulse 82, temperature 98 F (36.7 C), height 5' 6 (1.676 m), weight 229 lb (103.9 kg), SpO2 99%. Body mass index is 36.96 kg/m.  General: she is overweight, cooperative and in no acute distress. PSYCH: Has normal mood, affect and thought process.   HEENT: EOMI, sclerae are anicteric. Lungs: Normal breathing effort, no conversational dyspnea. Extremities: Moves * 4 Neurologic: A and O * 3, good insight  DIAGNOSTIC DATA REVIEWED: BMET    Component Value Date/Time   NA 138 01/14/2024 0827   K 4.3 01/14/2024 0827   CL 100 01/14/2024 0827   CO2 23 01/14/2024 0827   GLUCOSE 88 01/14/2024 0827   GLUCOSE 125 (H) 11/28/2017 2010   BUN 18 01/14/2024 0827   CREATININE 0.74 01/14/2024 0827   CALCIUM  9.9 01/14/2024 0827   GFRNONAA >60 11/28/2017 2010   GFRAA >60 11/28/2017 2010   Lab Results  Component Value Date   HGBA1C 5.9 (H) 01/14/2024   HGBA1C 7.4 (H) 02/28/2021   No results found for: INSULIN  Lab Results  Component Value Date   TSH 1.140 05/01/2023   CBC    Component Value Date/Time   WBC 4.7 05/01/2023  1602   WBC 6.6 11/28/2017 2010   RBC 4.01 05/01/2023 1602   RBC 4.31 11/28/2017 2010   HGB 11.8 05/01/2023 1602   HCT 37.2 05/01/2023 1602   PLT 225 05/01/2023 1602   MCV 93 05/01/2023 1602   MCH 29.4 05/01/2023 1602   MCH 29.0 11/28/2017 2010   MCHC 31.7 05/01/2023 1602   MCHC 30.9 11/28/2017 2010   RDW 12.9 05/01/2023 1602   Iron Studies No results found for: IRON, TIBC, FERRITIN, IRONPCTSAT Lipid Panel     Component Value Date/Time   CHOL 141 01/14/2024 0827   TRIG 97 01/14/2024 0827   HDL 62 01/14/2024 0827   CHOLHDL 2.3 01/14/2024 0827   LDLCALC 61 01/14/2024 0827   Hepatic Function Panel     Component Value Date/Time   PROT 7.2 01/14/2024 0827   ALBUMIN 4.7 01/14/2024 0827   AST 20 01/14/2024 0827   ALT 20 01/14/2024 0827   ALKPHOS 56 01/14/2024 0827   BILITOT 0.2 01/14/2024 0827   BILIDIR 0.2  03/24/2016 0650   IBILI 0.5 03/24/2016 0650      Component Value Date/Time   TSH 1.140 05/01/2023 1602   Nutritional Lab Results  Component Value Date   VD25OH 44.8 02/01/2024   VD25OH CANCELED 01/14/2024   VD25OH 42.9 07/16/2023    Attestations:   I, Sonny Laroche, acting as a Stage manager for Terri Jenkins, DO., have compiled all relevant documentation for today's office visit on behalf of Terri Jenkins, DO, while in the presence of [employer], DO.    I have reviewed the above documentation for accuracy and completeness, and I agree with the above. Terri JINNY [name], D.O.  The 21st Century Cures Act was signed into law in 2016 which includes the topic of electronic health records.  This provides immediate access to information in MyChart.  This includes consultation notes, operative notes, office notes, lab results and pathology reports.  If you have any questions about what you read please let us  know at your next visit so we can discuss your concerns and take corrective action if need be.  We are right here with you.

## 2024-05-11 ENCOUNTER — Other Ambulatory Visit: Payer: Self-pay | Admitting: Internal Medicine

## 2024-05-11 DIAGNOSIS — E785 Hyperlipidemia, unspecified: Secondary | ICD-10-CM

## 2024-05-26 ENCOUNTER — Other Ambulatory Visit (HOSPITAL_COMMUNITY): Payer: Self-pay

## 2024-05-26 ENCOUNTER — Ambulatory Visit (INDEPENDENT_AMBULATORY_CARE_PROVIDER_SITE_OTHER): Admitting: Family Medicine

## 2024-05-26 ENCOUNTER — Encounter (INDEPENDENT_AMBULATORY_CARE_PROVIDER_SITE_OTHER): Payer: Self-pay | Admitting: Family Medicine

## 2024-05-26 VITALS — BP 116/73 | HR 79 | Temp 98.5°F | Ht 66.0 in | Wt 227.0 lb

## 2024-05-26 DIAGNOSIS — Z7985 Long-term (current) use of injectable non-insulin antidiabetic drugs: Secondary | ICD-10-CM

## 2024-05-26 DIAGNOSIS — K5903 Drug induced constipation: Secondary | ICD-10-CM

## 2024-05-26 DIAGNOSIS — E559 Vitamin D deficiency, unspecified: Secondary | ICD-10-CM | POA: Diagnosis not present

## 2024-05-26 DIAGNOSIS — Z6836 Body mass index (BMI) 36.0-36.9, adult: Secondary | ICD-10-CM

## 2024-05-26 DIAGNOSIS — E1169 Type 2 diabetes mellitus with other specified complication: Secondary | ICD-10-CM | POA: Diagnosis not present

## 2024-05-26 DIAGNOSIS — Z7984 Long term (current) use of oral hypoglycemic drugs: Secondary | ICD-10-CM

## 2024-05-26 DIAGNOSIS — Z636 Dependent relative needing care at home: Secondary | ICD-10-CM | POA: Diagnosis not present

## 2024-05-26 MED ORDER — POLYETHYLENE GLYCOL 3350 17 GM/SCOOP PO POWD
ORAL | Status: AC
Start: 2024-05-26 — End: ?

## 2024-05-26 MED ORDER — VITAMIN D (ERGOCALCIFEROL) 1.25 MG (50000 UNIT) PO CAPS
50000.0000 [IU] | ORAL_CAPSULE | ORAL | 0 refills | Status: DC
  Filled 2024-05-26 – 2024-05-28 (×2): qty 9, 90d supply, fill #0

## 2024-05-26 MED ORDER — TIRZEPATIDE 12.5 MG/0.5ML ~~LOC~~ SOAJ
12.5000 mg | SUBCUTANEOUS | 0 refills | Status: DC
  Filled 2024-05-26: qty 6, 84d supply, fill #0

## 2024-05-26 NOTE — Progress Notes (Signed)
 Terri [name], D.O.  ABFM, ABOM Specializing in Clinical Bariatric Medicine  Office located at: [address] [place], [place]  [address]      A) FOR THE CHRONIC DISEASE OF OBESITY:  Chief complaint: Obesity Terri [name] is here to discuss her progress with her obesity treatment plan.   History of present illness / Interval history:  Terri [name] is here today for her follow-up office visit.  Since last OV on 04/28/24, pt is down 2 lbs. Pt states that she is doing good but has been stressed and is trying to keep rolling. See claims that she has been eating a lot of fish and is low on her calories ranging from 1200 to 1300. She reports getting 80 oz of water on average.      04/28/24 14:00 05/26/24 14:00   Body Fat % 48.5 % 49.4 %  Muscle Mass (lbs) 112 lbs 109.2 lbs  Fat Mass (lbs) 111.2 lbs 112.4 lbs  Total Body Water (lbs) 93 lbs   Visceral Fat Rating  14 14   Counseling done on how various foods will affect these numbers and how to maximize success   Total lbs lost to date: - 28 lbs Total Fat Mass in lbs lost to date: - 15.6 lbs Total weight loss percentage to date: - 10.98 %    Morbid obesity (HCC)-start bmi 41.16/date 08/22/21 BMI 36.0-36.9,adult current 36.66  Nutrition Therapy She is Journaling 1350-1450 calories and 90++ grams of protein per day and states she is following her eating plan approximately 75-80 % of the time.   - Tracking Calories/Macros: yes  - Eating More Whole Foods: yes  - Adequate Protein Intake: yes  - Adequate Water Intake: yes  - Skipping Meals: yes  - Sleeping 7-9 Hours/ Night: no    Terri [name] is currently in the action stage of change. As such, her goal is to continue weight management plan.  She has agreed to: continue current plan   Physical Activity Terri [name] is walking 30  minutes 3 days per week   Terri [name] has been advised to work up to 300-450 minutes of moderate intensity aerobic activity a week and  strengthening exercises 2-3 times per week for cardiovascular health, weight loss maintenance and preservation of muscle mass.  She has agreed to : Think about enjoyable ways to increase daily physical activity and overcoming barriers to exercise, Increase physical activity in their day and reduce sedentary time (increase NEAT)., and Increase volume of physical activity to a goal of 240 minutes a week   Behavioral Modifications Evidence-based interventions for health behavior change were utilized today including the discussion of  1) self monitoring techniques:    - Surround yourself with support   2) problem-solving barriers:    3) self care:    - Explore meditation, deep breathing   - 4-7-8 breathing   4) SMART goals for next OV:    - Write total calorie count at the end of the day   - Focus on self care  Regarding patient's less desirable eating habits and patterns, we employed the technique of small changes.   We discussed the following today: avoiding skipping meals, increasing water intake , and practice mindfulness eating and understand the difference between hunger signals and cravings, Extensive discussion on how stress can prevent weight loss and how to help find self care  Additional resources provided today: Handout on Daily Food Journaling Log   Medical Interventions/ Pharmacotherapy Previous Bariatric surgery: n/a Pharmacotherapy  for weight loss: She is currently taking Metformin  500 mg BID and Mounjaro  10 mg weekly for medical weight loss.    We discussed various medication options to help [facility] with her weight loss efforts and we both agreed to : Continue with current nutritional and behavioral strategies and Adequate clinical response to anti-obesity medication, continue current regimen   B) OBESITY RELATED CONDITIONS ADDRESSED TODAY:  Type 2 diabetes mellitus associated with morbid obesity (HCC) Drug induced constipation Assessment & Plan Lab Results   Component Value Date   HGBA1C 5.9 (H) 01/14/2024   HGBA1C 6.0 (H) 07/16/2023   HGBA1C 6.2 (H) 02/09/2023    On Mounjaro  10 mg weekly and Metformin  500 mg BID. Good compliance and tolerance.Pt endorses that her constipation has resolved since she has started eating more fiber. Pts hunger and cravings are okay. Pt is part of medically driven obesity program and has been very compliant with diet and exercise regimen as well as self care activities and continues to increase her fat mass despite her adequate efforts and due to this best at this time to Increase Mounjaro  to 12.5 mg weekly. Continue to follow meal plan and increase water intake to half of her body weight in oz. If constipation occurs recommended pt to start Miralax  twice daily to regulate stools and then daily and then as needed.    Vitamin D  deficiency Assessment & Plan Lab Results  Component Value Date   VD25OH 44.8 02/01/2024   VD25OH CANCELED 01/14/2024   VD25OH 42.9 07/16/2023   Taking ERGO 50K units once weekly. Good compliance and tolerance. No acute concerns today. Will refill today. Continue supplementation.     Caregiver stress Assessment & Plan Pt states that she has been under a lot of stress due to her job and caring for her mother. Explained to pt how stress causes weight loss inhibition. Recommended pt to find support and practice mediation and 4-7-8 breathing.      Medications Discontinued During This Encounter  Medication Reason   tirzepatide  (MOUNJARO ) 10 MG/0.5ML Pen    polyethylene glycol powder (GLYCOLAX /MIRALAX ) 17 GM/SCOOP powder Reorder   Vitamin D , Ergocalciferol , (DRISDOL ) 1.25 MG (50000 UNIT) CAPS capsule Reorder     Meds ordered this encounter  Medications   tirzepatide  (MOUNJARO ) 12.5 MG/0.5ML Pen    Sig: Inject 12.5 mg into the skin once a week.    Dispense:  6 mL    Refill:  0   Vitamin D , Ergocalciferol , (DRISDOL ) 1.25 MG (50000 UNIT) CAPS capsule    Sig: Take 1 capsule by mouth  every 10 days    Dispense:  9 capsule    Refill:  0   polyethylene glycol powder (GLYCOLAX /MIRALAX ) 17 GM/SCOOP powder    Sig: Twice daily to regulate stools, then daily / prn      Follow up:   Return 06/20/2024 at 4:00 PM  She was informed of the importance of frequent follow up visits to maximize her success with intensive lifestyle modifications for her multiple health conditions.   Weight Summary and Biometrics   Weight Lost Since Last Visit: 2lb  Weight Gained Since Last Visit: 0lb    Vitals Temp: 98.5 F (36.9 C) BP: 116/73 Pulse Rate: 79 SpO2: 98 %   Anthropometric Measurements Height: 5' 6 (1.676 m) Weight: 227 lb (103 kg) BMI (Calculated): 36.66 Weight at Last Visit: 229lb Weight Lost Since Last Visit: 2lb Weight Gained Since Last Visit: 0lb Starting Weight: 255lb Total Weight Loss (lbs): 28 lb (12.7 kg)  Peak Weight: 260lb   Body Composition  Body Fat %: 49.4 % Fat Mass (lbs): 112.4 lbs Muscle Mass (lbs): 109.2 lbs Visceral Fat Rating : 14   Other Clinical Data Fasting: no Labs: no Today's Visit #: 36 Starting Date: 08/22/21    Objective:   PHYSICAL EXAM: Blood pressure 116/73, pulse 79, temperature 98.5 F (36.9 C), height 5' 6 (1.676 m), weight 227 lb (103 kg), SpO2 98%. Body mass index is 36.64 kg/m.  General: she is overweight, cooperative and in no acute distress. PSYCH: Has normal mood, affect and thought process.   HEENT: EOMI, sclerae are anicteric. Lungs: Normal breathing effort, no conversational dyspnea. Extremities: Moves * 4 Neurologic: A and O * 3, good insight  DIAGNOSTIC DATA REVIEWED: BMET    Component Value Date/Time   NA 138 01/14/2024 0827   K 4.3 01/14/2024 0827   CL 100 01/14/2024 0827   CO2 23 01/14/2024 0827   GLUCOSE 88 01/14/2024 0827   GLUCOSE 125 (H) 11/28/2017 2010   BUN 18 01/14/2024 0827   CREATININE 0.74 01/14/2024 0827   CALCIUM  9.9 01/14/2024 0827   GFRNONAA >60 11/28/2017 2010   GFRAA >60  11/28/2017 2010   Lab Results  Component Value Date   HGBA1C 5.9 (H) 01/14/2024   HGBA1C 7.4 (H) 02/28/2021   No results found for: INSULIN  Lab Results  Component Value Date   TSH 1.140 05/01/2023   CBC    Component Value Date/Time   WBC 4.7 05/01/2023 1602   WBC 6.6 11/28/2017 2010   RBC 4.01 05/01/2023 1602   RBC 4.31 11/28/2017 2010   HGB 11.8 05/01/2023 1602   HCT 37.2 05/01/2023 1602   PLT 225 05/01/2023 1602   MCV 93 05/01/2023 1602   MCH 29.4 05/01/2023 1602   MCH 29.0 11/28/2017 2010   MCHC 31.7 05/01/2023 1602   MCHC 30.9 11/28/2017 2010   RDW 12.9 05/01/2023 1602   Iron Studies No results found for: IRON, TIBC, FERRITIN, IRONPCTSAT Lipid Panel     Component Value Date/Time   CHOL 141 01/14/2024 0827   TRIG 97 01/14/2024 0827   HDL 62 01/14/2024 0827   CHOLHDL 2.3 01/14/2024 0827   LDLCALC 61 01/14/2024 0827   Hepatic Function Panel     Component Value Date/Time   PROT 7.2 01/14/2024 0827   ALBUMIN 4.7 01/14/2024 0827   AST 20 01/14/2024 0827   ALT 20 01/14/2024 0827   ALKPHOS 56 01/14/2024 0827   BILITOT 0.2 01/14/2024 0827   BILIDIR 0.2 03/24/2016 0650   IBILI 0.5 03/24/2016 0650      Component Value Date/Time   TSH 1.140 05/01/2023 1602   Nutritional Lab Results  Component Value Date   VD25OH 44.8 02/01/2024   VD25OH CANCELED 01/14/2024   VD25OH 42.9 07/16/2023    Attestations:   I, Sonny Laroche, acting as a Stage manager for Terri Jenkins, DO., have compiled all relevant documentation for today's office visit on behalf of Terri Jenkins, DO, while in the presence of [employer], DO.  I have spent 40 minutes in the care of the patient today including 30 minutes face-to-face assessing and reviewing listed medical problems above as outlined in office visit note and providing nutritional and behavioral counseling as outlined in obesity care plan.   I have reviewed the above documentation for accuracy and completeness,  and I agree with the above. Terri JINNY [name], D.O.  The 21st Century Cures Act was signed into law in 2016 which includes the topic  of electronic health records.  This provides immediate access to information in MyChart.  This includes consultation notes, operative notes, office notes, lab results and pathology reports.  If you have any questions about what you read please let us  know at your next visit so we can discuss your concerns and take corrective action if need be.  We are right here with you.

## 2024-05-27 ENCOUNTER — Other Ambulatory Visit (HOSPITAL_COMMUNITY): Payer: Self-pay

## 2024-05-28 ENCOUNTER — Other Ambulatory Visit (HOSPITAL_COMMUNITY): Payer: Self-pay

## 2024-06-14 ENCOUNTER — Ambulatory Visit

## 2024-06-14 DIAGNOSIS — I152 Hypertension secondary to endocrine disorders: Secondary | ICD-10-CM | POA: Diagnosis not present

## 2024-06-14 DIAGNOSIS — E559 Vitamin D deficiency, unspecified: Secondary | ICD-10-CM

## 2024-06-14 DIAGNOSIS — Z7984 Long term (current) use of oral hypoglycemic drugs: Secondary | ICD-10-CM

## 2024-06-14 DIAGNOSIS — E1159 Type 2 diabetes mellitus with other circulatory complications: Secondary | ICD-10-CM | POA: Diagnosis not present

## 2024-06-14 DIAGNOSIS — E785 Hyperlipidemia, unspecified: Secondary | ICD-10-CM

## 2024-06-14 DIAGNOSIS — E669 Obesity, unspecified: Secondary | ICD-10-CM

## 2024-06-14 NOTE — Progress Notes (Signed)
 Established Patient Office Visit  Subjective   Patient ID: Terri [name], female    DOB: [birth year]  Age: 58 y.o. MRN: [id number]  Chief Complaint  Patient presents with   Medical Management of Chronic Issues    6 month follow up     HPI Discussed the use of AI scribe software for clinical note transcription with the patient, who gave verbal consent to proceed.  History of Present Illness    Terri [name] is a 58 year old female with hypertension and diabetes who presents for a six-month follow-up visit.  Hypertension - Currently taking amlodipine  and valsartan  with hydrochlorothiazide  for blood pressure control - Blood pressure readings have been stable and lower than in the past year, with recent measurements of 122/70 mmHg and 116/73 mmHg - Prefers to discontinue one antihypertensive medication, favoring the one she feels is most effective - Monitors blood pressure at home  Peripheral edema - Experiences occasional leg swelling, which she associates with amlodipine  - Leg swelling improves with exercise and adequate hydration - Wears compression socks to work for management of leg swelling  Diabetes mellitus type 2 - Continues metformin  twice daily for glycemic control - Last recorded hemoglobin A1c was 5.9%  Obesity and weight management - Participating in a weight management program for approximately 1.5 years - Significant weight loss from 270-280 lbs to 228 lbs - Currently taking Majora for weight management  Environmental exposure - Recent concern for mold exposure due to a leak behind her washing machine - No symptoms related to mold exposure     Patient Active Problem List   Diagnosis Date Noted   Type 2 diabetes mellitus associated with morbid obesity (HCC) 05/26/2024   Nocturia 03/18/2024   Mixed stress and urge urinary incontinence 03/18/2024   Vaginal atrophy 03/18/2024   Female genital prolapse 12/31/2023   Contact dermatitis 10/21/2023    Other constipation 10/14/2023   It band syndrome, right 05/01/2023   Need for influenza vaccination 05/01/2023   Skin lesion of neck 01/23/2023   Morbid obesity (HCC) 09/08/2022   BMI 39.0-39.9,adult 09/08/2022   Type 2 diabetes mellitus in patient with obesity (HCC) 07/28/2022   Hypertension, essential 07/07/2022   H/O total hysterectomy 05/06/2022   Vitamin D  deficiency 01/21/2022   Arthritis 07/13/2021   Class 3 severe obesity with serious comorbidity and body mass index (BMI) of 40.0 to 44.9 in adult [facility]) 07/12/2021   Encounter for well adult exam with abnormal findings 02/14/2021   HLD (hyperlipidemia) 02/14/2021   Diabetes mellitus (HCC) 03/20/2016   Hypertension associated with type 2 diabetes mellitus (HCC)       ROS    Objective:     BP 122/75   Pulse 92   Ht 5' 6 (1.676 m)   Wt 228 lb 1.9 oz (103.5 kg)   SpO2 95%   BMI 36.82 kg/m  BP Readings from Last 3 Encounters:  06/14/24 122/75  05/26/24 116/73  04/28/24 123/71   Wt Readings from Last 3 Encounters:  06/14/24 228 lb 1.9 oz (103.5 kg)  05/26/24 227 lb (103 kg)  04/28/24 229 lb (103.9 kg)     Physical Exam Vitals and nursing note reviewed.  Constitutional:      Appearance: Normal appearance.  HENT:     Head: Normocephalic.     Right Ear: Tympanic membrane, ear canal and external ear normal.     Left Ear: Tympanic membrane, ear canal and external ear normal.     Nose: Nose  normal.     Mouth/Throat:     Mouth: Mucous membranes are moist.     Pharynx: Oropharynx is clear.  Eyes:     Extraocular Movements: Extraocular movements intact.     Pupils: Pupils are equal, round, and reactive to light.  Cardiovascular:     Rate and Rhythm: Normal rate and regular rhythm.  Pulmonary:     Effort: Pulmonary effort is normal.     Breath sounds: Normal breath sounds.  Musculoskeletal:     Cervical back: Normal range of motion and neck supple.  Skin:    General: Skin is warm and dry.  Neurological:      Mental Status: She is alert and oriented to person, place, and time.  Psychiatric:        Mood and Affect: Mood normal.        Thought Content: Thought content normal.    No results found for any visits on 06/14/24.  Last CBC Lab Results  Component Value Date   WBC 4.7 05/01/2023   HGB 11.8 05/01/2023   HCT 37.2 05/01/2023   MCV 93 05/01/2023   MCH 29.4 05/01/2023   RDW 12.9 05/01/2023   PLT 225 05/01/2023   Last metabolic panel Lab Results  Component Value Date   GLUCOSE 88 01/14/2024   NA 138 01/14/2024   K 4.3 01/14/2024   CL 100 01/14/2024   CO2 23 01/14/2024   BUN 18 01/14/2024   CREATININE 0.74 01/14/2024   EGFR 94 01/14/2024   CALCIUM  9.9 01/14/2024   PHOS 2.3 (L) 03/22/2016   PROT 7.2 01/14/2024   ALBUMIN 4.7 01/14/2024   LABGLOB 2.5 01/14/2024   AGRATIO 1.8 03/18/2022   BILITOT 0.2 01/14/2024   ALKPHOS 56 01/14/2024   AST 20 01/14/2024   ALT 20 01/14/2024   ANIONGAP 11 11/28/2017   Last lipids Lab Results  Component Value Date   CHOL 141 01/14/2024   HDL 62 01/14/2024   LDLCALC 61 01/14/2024   TRIG 97 01/14/2024   CHOLHDL 2.3 01/14/2024   Last hemoglobin A1c Lab Results  Component Value Date   HGBA1C 5.9 (H) 01/14/2024   Last thyroid functions Lab Results  Component Value Date   TSH 1.140 05/01/2023   FREET4 1.13 05/01/2023   Last vitamin D  Lab Results  Component Value Date   VD25OH 44.8 02/01/2024   Last vitamin B12 and Folate Lab Results  Component Value Date   VITAMINB12 835 02/01/2024   FOLATE 7.6 05/01/2023      The 10-year ASCVD risk score (Arnett DK, et al., 2019) is: 7.4%    Assessment & Plan:   Problem List Items Addressed This Visit       Cardiovascular and Mediastinum   Hypertension associated with type 2 diabetes mellitus (HCC)   Well-controlled with valsartan  and hydrochlorothiazide . Occasional leg swelling likely due to amlodipine . - Discontinued amlodipine . - Continue valsartan  and hydrochlorothiazide . -  Monitor blood pressure at home, target <130/80. - Provided blood pressure guidelines. - Consider reintroducing amlodipine  if BP >120/80 consistently.        Endocrine   Type 2 diabetes mellitus in patient with obesity (HCC)   Good control with metformin  and weight management. A1c at 5.9, significant weight loss noted. - Continue metformin  twice daily. - Continue weight management program. - Monitor A1c; consider reducing metformin  if A1c decreases further.        Other   Vitamin D  deficiency   Currently prescribed vitamin D  supplementation (50,000 IU) every 3 weeks.  Repeat vitamin D  level ordered today.      Return in about 6 months (around 12/12/2024) for chronic follow-up with PCP.    Leita Longs, FNP

## 2024-06-16 ENCOUNTER — Ambulatory Visit: Admitting: Obstetrics

## 2024-06-19 NOTE — Assessment & Plan Note (Signed)
 Well-controlled with valsartan  and hydrochlorothiazide . Occasional leg swelling likely due to amlodipine . - Discontinued amlodipine . - Continue valsartan  and hydrochlorothiazide . - Monitor blood pressure at home, target <130/80. - Provided blood pressure guidelines. - Consider reintroducing amlodipine  if BP >120/80 consistently.

## 2024-06-19 NOTE — Assessment & Plan Note (Signed)
 Good control with metformin  and weight management. A1c at 5.9, significant weight loss noted. - Continue metformin  twice daily. - Continue weight management program. - Monitor A1c; consider reducing metformin  if A1c decreases further.

## 2024-06-19 NOTE — Assessment & Plan Note (Signed)
Currently prescribed vitamin D supplementation (50,000 IU) every 3 weeks.  Repeat vitamin D level ordered today.

## 2024-06-20 ENCOUNTER — Encounter (INDEPENDENT_AMBULATORY_CARE_PROVIDER_SITE_OTHER): Payer: Self-pay | Admitting: Family Medicine

## 2024-06-20 ENCOUNTER — Ambulatory Visit (INDEPENDENT_AMBULATORY_CARE_PROVIDER_SITE_OTHER): Payer: Self-pay | Admitting: Family Medicine

## 2024-06-20 DIAGNOSIS — E538 Deficiency of other specified B group vitamins: Secondary | ICD-10-CM | POA: Diagnosis not present

## 2024-06-20 DIAGNOSIS — E119 Type 2 diabetes mellitus without complications: Secondary | ICD-10-CM

## 2024-06-20 DIAGNOSIS — Z636 Dependent relative needing care at home: Secondary | ICD-10-CM

## 2024-06-20 DIAGNOSIS — Z7985 Long-term (current) use of injectable non-insulin antidiabetic drugs: Secondary | ICD-10-CM

## 2024-06-20 DIAGNOSIS — I152 Hypertension secondary to endocrine disorders: Secondary | ICD-10-CM | POA: Diagnosis not present

## 2024-06-20 DIAGNOSIS — E559 Vitamin D deficiency, unspecified: Secondary | ICD-10-CM

## 2024-06-20 DIAGNOSIS — Z7984 Long term (current) use of oral hypoglycemic drugs: Secondary | ICD-10-CM

## 2024-06-20 DIAGNOSIS — E1159 Type 2 diabetes mellitus with other circulatory complications: Secondary | ICD-10-CM | POA: Diagnosis not present

## 2024-06-20 DIAGNOSIS — Z6836 Body mass index (BMI) 36.0-36.9, adult: Secondary | ICD-10-CM

## 2024-06-20 NOTE — Progress Notes (Signed)
 [name], D.O.  ABFM, ABOM Specializing in Clinical Bariatric Medicine  Office located at: [address] [place], [place]  [address]      A) FOR THE CHRONIC DISEASE OF OBESITY:  Chief complaint: Obesity Terri [name] is here to discuss her progress with her obesity treatment plan.   History of present illness / Interval history:  Terri [name] is here today for her follow-up office visit.  Since last OV on 05/26/2024, pt is down 1 lb.    05/26/24 14:00 06/20/24 15:00   Body Fat % 49.4 % 48.9 %  Muscle Mass (lbs) 109.2 lbs 110.6 lbs  Fat Mass (lbs) 112.4 lbs 111.2 lbs  Total Body Water (lbs)  90.2 lbs  Visceral Fat Rating  14 14  Counseling done on how various foods will affect these numbers and how to maximize success   Total lbs lost to date: -28 lbs Total Fat Mass in lbs lost to date: -16.8 Total weight loss percentage to date: -10.98 %   Nutrition Therapy She is journaling 1350-1450 calories and 90++ grams of protein per day  and states she is following her eating plan approximately 80 % of the time.   - Tracking Calories/Macros: yes  - Eating More Whole Foods: yes  - Adequate Protein Intake: yes  - Adequate Water Intake: yes  - Skipping Meals: no -    - Sleeping 7-9 Hours/ Night: yes   Terri [name] is currently in the action stage of change. As such, her goal is to continue weight management plan.  She has agreed to: switch to journaling 1300-1400 calories and 90++ per grams per day.   Physical Activity Pt is walking 30  minutes 4 days per week   Saiya has been advised to work up to 300-450 minutes of moderate intensity aerobic activity a week and strengthening exercises 2-3 times per week for cardiovascular health, weight loss maintenance and preservation of muscle mass.  She has agreed to : Think about enjoyable ways to increase daily physical activity and overcoming barriers to exercise, Increase physical activity in their day and reduce  sedentary time (increase NEAT)., Increase volume of physical activity to a goal of 240 minutes a week, and Combine aerobic and strengthening exercises for efficiency and improved cardiometabolic health.   Behavioral Modifications Evidence-based interventions for health behavior change were utilized today including the discussion of  1) self monitoring techniques:  journaling 2) problem-solving barriers:  at home and work stress 3) self care:  exercise,  listening to jazz music  4) SMART goals for next OV:  Increase walking 30 minutes 5 days a week Regarding patient's less desirable eating habits and patterns, we employed the technique of small changes.   We discussed the following today: increasing lean protein intake to established goals, decreasing simple carbohydrates , increasing fiber rich foods, increasing water intake , work on meal planning and preparation, work on tracking and journaling calories using tracking application, work on managing stress, creating time for self-care and relaxation, continue to work on implementation of reduced calorie nutritional plan, and focusing on food with a 10:1 ratio of calories: grams of protein, high protein pasta Additional resources provided today: Tips and tricks for Thanksgiving.   Medical Interventions/ Pharmacotherapy Previous Bariatric surgery: none Pharmacotherapy for weight loss: She is currently taking Metformin  500 mg twice daily and Mounjaro  12.5 mg weekly for medical weight loss.     We discussed various medication options to help Terri [name] with her weight loss efforts  and we both agreed to : Continue with current nutritional and behavioral strategies   B) OBESITY RELATED CONDITIONS ADDRESSED TODAY:  Morbid obesity (HCC)-start bmi 41.16/date 08/22/21 BMI 36.0-36.9,adult current 36.66    Type 2 diabetes mellitus in patient with obesity [facility]) Assessment & Plan Lab Results  Component Value Date   HGBA1C 5.9 (H) 01/14/2024   HGBA1C  6.0 (H) 07/16/2023   HGBA1C 6.2 (H) 02/09/2023  Currently on Mounjaro  12.5 mg weekly and Metformin  500 mg twice daily with good compliance and tolerance. Hunger and cravings have been well controlled. Reports some constipation when she first started 12.5 mg Mounjaro  dose, but she increased her Miralax  which helped to resolve constipation. Encouraged to increase water and fiber intake.  A1c is above goal at 5.9; optimal <5.6 .Cont adherence to medication. Cont decreasing simple carbs/sugars and increasing lean protein. Increase exercise as able.    Hypertension associated with type 2 diabetes mellitus [facility]) Assessment & Plan BP Readings from Last 3 Encounters:  06/20/24 125/74  06/14/24 122/75  05/26/24 116/73   The 10-year ASCVD risk score (Arnett DK, et al., 2019) is: 8%  Lab Results  Component Value Date   CREATININE 0.74 01/14/2024  BP is well controlled today. Currently on valsartan -hydrochlorothiazide  1 tablet daily. PCP discontinued amlodipine  on 11/4 and BP has been well controlled at home. No acute concerns today. Cont to decrease salt intake and increase water intake. Cont to monitor BP at home. Encouraged to f/u with PCP as needed    Caregiver stress Assessment & Plan Reports stress has been better well controlled and declines any need for medications. Has been practicing 4-7-8 breathing and reports that it has been beneficial for her mental well being. Cont implementing 4-7-8 breathing, obtaining adequate sleep, and increasing exercise as able to help manage stress.     Vitamin D  deficiency Assessment & Plan Lab Results  Component Value Date   VD25OH 44.8 02/01/2024   VD25OH CANCELED 01/14/2024   VD25OH 42.9 07/16/2023  Currently on Ergo 50 K units once every 10 days with good compliance and tolerance. Vit D levels slightly below goal at 44.8. Optimal 50-70.  Cont regimen. At next OV we will possible non-fasting labs to obtain in the near future.    B12 deficiency  due to diet Assessment & Plan - B12 level is 835, at goal of over 500.  This diagnosis was reviewed with the patient and education was provided.  Lab Results  Component Value Date   VITAMINB12 835 02/01/2024  Currently on OTC Vit B12 500 mcg daily with good compliance and tolerance. - Continue prudent nutritional plan and focus on b12 rich foods such as lean red meats; poultry; eggs; seafood; beans, peas, and lentils; nuts and seeds; and soy products - Will continue to monitor as deemed clinically necessary.    There are no discontinued medications.   No orders of the defined types were placed in this encounter.     Follow up:   No follow-ups on file. *** She was informed of the importance of frequent follow up visits to maximize her success with intensive lifestyle modifications for her multiple health conditions.   Weight Summary and Biometrics   Weight Lost Since Last Visit: 0  Weight Gained Since Last Visit: 0    Vitals Temp: 98.1 F (36.7 C) BP: 125/74 Pulse Rate: 84 SpO2: 97 %   Anthropometric Measurements Height: 5' 6 (1.676 m) Weight: 227 lb (103 kg) BMI (Calculated): 36.66 Weight at Last Visit: 227lb  Weight Lost Since Last Visit: 0 Weight Gained Since Last Visit: 0 Starting Weight: 255lb Total Weight Loss (lbs): 28 lb (12.7 kg) Peak Weight: 260lb   Body Composition  Body Fat %: 48.9 % Fat Mass (lbs): 111.2 lbs Muscle Mass (lbs): 110.6 lbs Total Body Water (lbs): 90.2 lbs Visceral Fat Rating : 14   Other Clinical Data Fasting: no Labs: no Today's Visit #: 37 Starting Date: 08/22/21    Objective:   PHYSICAL EXAM: Blood pressure 125/74, pulse 84, temperature 98.1 F (36.7 C), height 5' 6 (1.676 m), weight 227 lb (103 kg), SpO2 97%. Body mass index is 36.64 kg/m.  General: she is overweight, cooperative and in no acute distress. PSYCH: Has normal mood, affect and thought process.   HEENT: EOMI, sclerae are anicteric. Lungs: Normal  breathing effort, no conversational dyspnea. Extremities: Moves * 4 Neurologic: A and O * 3, good insight  DIAGNOSTIC DATA REVIEWED: BMET    Component Value Date/Time   NA 138 01/14/2024 0827   K 4.3 01/14/2024 0827   CL 100 01/14/2024 0827   CO2 23 01/14/2024 0827   GLUCOSE 88 01/14/2024 0827   GLUCOSE 125 (H) 11/28/2017 2010   BUN 18 01/14/2024 0827   CREATININE 0.74 01/14/2024 0827   CALCIUM  9.9 01/14/2024 0827   GFRNONAA >60 11/28/2017 2010   GFRAA >60 11/28/2017 2010   Lab Results  Component Value Date   HGBA1C 5.9 (H) 01/14/2024   HGBA1C 7.4 (H) 02/28/2021   No results found for: INSULIN  Lab Results  Component Value Date   TSH 1.140 05/01/2023   CBC    Component Value Date/Time   WBC 4.7 05/01/2023 1602   WBC 6.6 11/28/2017 2010   RBC 4.01 05/01/2023 1602   RBC 4.31 11/28/2017 2010   HGB 11.8 05/01/2023 1602   HCT 37.2 05/01/2023 1602   PLT 225 05/01/2023 1602   MCV 93 05/01/2023 1602   MCH 29.4 05/01/2023 1602   MCH 29.0 11/28/2017 2010   MCHC 31.7 05/01/2023 1602   MCHC 30.9 11/28/2017 2010   RDW 12.9 05/01/2023 1602   Iron Studies No results found for: IRON, TIBC, FERRITIN, IRONPCTSAT Lipid Panel     Component Value Date/Time   CHOL 141 01/14/2024 0827   TRIG 97 01/14/2024 0827   HDL 62 01/14/2024 0827   CHOLHDL 2.3 01/14/2024 0827   LDLCALC 61 01/14/2024 0827   Hepatic Function Panel     Component Value Date/Time   PROT 7.2 01/14/2024 0827   ALBUMIN 4.7 01/14/2024 0827   AST 20 01/14/2024 0827   ALT 20 01/14/2024 0827   ALKPHOS 56 01/14/2024 0827   BILITOT 0.2 01/14/2024 0827   BILIDIR 0.2 03/24/2016 0650   IBILI 0.5 03/24/2016 0650      Component Value Date/Time   TSH 1.140 05/01/2023 1602   Nutritional Lab Results  Component Value Date   VD25OH 44.8 02/01/2024   VD25OH CANCELED 01/14/2024   VD25OH 42.9 07/16/2023    Attestations:   I, ***, acting as a stage manager for Barnie Jenkins, DO., have compiled all  relevant documentation for today's office visit on behalf of Barnie Jenkins, DO, while in the presence of Terri & Mclennan, DO.  I have spent *** minutes in the care of the patient today including *** minutes face-to-face assessing and reviewing listed medical problems above as outlined in office visit note and providing nutritional and behavioral counseling as outlined in obesity care plan.   I have reviewed the above documentation for accuracy  and completeness, and I agree with the above. Barnie JINNY Jenkins, D.O.  The 21st Century Cures Act was signed into law in 2016 which includes the topic of electronic health records.  This provides immediate access to information in MyChart.  This includes consultation notes, operative notes, office notes, lab results and pathology reports.  If you have any questions about what you read please let us  know at your next visit so we can discuss your concerns and take corrective action if need be.  We are right here with you.

## 2024-07-12 ENCOUNTER — Ambulatory Visit: Admitting: Obstetrics and Gynecology

## 2024-07-12 ENCOUNTER — Other Ambulatory Visit: Payer: Self-pay | Admitting: Internal Medicine

## 2024-07-12 ENCOUNTER — Encounter: Payer: Self-pay | Admitting: Obstetrics and Gynecology

## 2024-07-12 VITALS — BP 123/74 | HR 89 | Ht 64.5 in | Wt 229.0 lb

## 2024-07-12 DIAGNOSIS — N813 Complete uterovaginal prolapse: Secondary | ICD-10-CM | POA: Diagnosis not present

## 2024-07-12 DIAGNOSIS — N993 Prolapse of vaginal vault after hysterectomy: Secondary | ICD-10-CM

## 2024-07-12 DIAGNOSIS — E785 Hyperlipidemia, unspecified: Secondary | ICD-10-CM

## 2024-07-12 MED ORDER — ESTRADIOL 0.01 % VA CREA: 0.5000 g | TOPICAL_CREAM | VAGINAL | 11 refills | Status: AC

## 2024-07-12 NOTE — Progress Notes (Signed)
 [facility]   Subjective:     Chief Complaint: Follow-up Terri [name] is a 58 y.o. female here today for female organ prolapse.)  History of Present Illness: Terri [name] is a 58 y.o. female with stage III pelvic organ prolapse who presents today for a pessary fitting.    Past Medical History: Patient  has a past medical history of Arthritis, Back pain, Diabetes mellitus without complication (HCC), Edema, lower extremity, Hip pain, Hyperlipidemia, Hypertension, Joint pain, and Knee pain.   Past Surgical History: She  has a past surgical history that includes Ankle Fusion (Right); Cholecystectomy (N/A, 03/21/2016); Appendectomy; and Vaginal hysterectomy.   Medications: She has a current medication list which includes the following prescription(s): cyanocobalamin , onetouch verio, metformin , mirabegron  er, polyethylene glycol powder, rosuvastatin , tirzepatide , UNABLE TO FIND, valsartan -hydrochlorothiazide , and vitamin d  (ergocalciferol ).   Allergies: Patient is allergic to penicillins.   Social History: Patient  reports that she has never smoked. She has never used smokeless tobacco. She reports that she does not drink alcohol and does not use drugs.      Objective:    BP 123/74   Pulse 89  Gen: No apparent distress, A&O x 3. Pelvic Exam: Normal external female genitalia; Bartholin's and Skene's glands normal in appearance; urethral meatus normal in appearance, no urethral masses or discharge.   Attempted a #4 Cube pessary that was a little too small and slid down with urination/defecation attempt.   A size #5 cube pessary ([id number]) was fitted. It was comfortable, stayed in place with valsalva and was an appropriate size on examination, with one finger fitting between the pessary and the vaginal walls. The cube has a string to it. Patient was able to place pessary and reports she can remove at home. We discussed removing the pessary for intercourse  as well.   Assessment/Plan:    Assessment: Terri [name] is a 58 y.o. with stage III pelvic organ prolapse who presents for a pessary fitting. Plan: She was fitted with a #5 cube pessary. She will keep the pessary in place until next visit. She will use estrogen.   Follow-up in 6 weeks for a pessary check or sooner as needed. Patient reports she thinks she wants a surgical repair. She will follow up with Dr. Guadlupe for further discussion on surgery.    All questions were answered.    [name], NP

## 2024-07-22 ENCOUNTER — Ambulatory Visit: Admitting: Obstetrics

## 2024-07-25 ENCOUNTER — Ambulatory Visit (INDEPENDENT_AMBULATORY_CARE_PROVIDER_SITE_OTHER): Payer: Self-pay | Admitting: Family Medicine

## 2024-07-25 ENCOUNTER — Other Ambulatory Visit (HOSPITAL_COMMUNITY): Payer: Self-pay

## 2024-07-25 ENCOUNTER — Encounter (INDEPENDENT_AMBULATORY_CARE_PROVIDER_SITE_OTHER): Payer: Self-pay | Admitting: Family Medicine

## 2024-07-25 DIAGNOSIS — Z7985 Long-term (current) use of injectable non-insulin antidiabetic drugs: Secondary | ICD-10-CM | POA: Diagnosis not present

## 2024-07-25 DIAGNOSIS — Z7984 Long term (current) use of oral hypoglycemic drugs: Secondary | ICD-10-CM

## 2024-07-25 DIAGNOSIS — Z6836 Body mass index (BMI) 36.0-36.9, adult: Secondary | ICD-10-CM

## 2024-07-25 DIAGNOSIS — E669 Obesity, unspecified: Secondary | ICD-10-CM

## 2024-07-25 DIAGNOSIS — E1169 Type 2 diabetes mellitus with other specified complication: Secondary | ICD-10-CM

## 2024-07-25 DIAGNOSIS — E559 Vitamin D deficiency, unspecified: Secondary | ICD-10-CM

## 2024-07-25 DIAGNOSIS — I152 Hypertension secondary to endocrine disorders: Secondary | ICD-10-CM

## 2024-07-25 DIAGNOSIS — E785 Hyperlipidemia, unspecified: Secondary | ICD-10-CM | POA: Diagnosis not present

## 2024-07-25 DIAGNOSIS — I10 Essential (primary) hypertension: Secondary | ICD-10-CM

## 2024-07-25 DIAGNOSIS — E1159 Type 2 diabetes mellitus with other circulatory complications: Secondary | ICD-10-CM

## 2024-07-25 MED ORDER — TIRZEPATIDE 15 MG/0.5ML ~~LOC~~ SOAJ
15.0000 mg | SUBCUTANEOUS | 1 refills | Status: AC
  Filled 2024-07-25: qty 6, 84d supply, fill #0

## 2024-07-25 NOTE — Progress Notes (Signed)
 "  [name], D.O.  ABFM, ABOM Specializing in Clinical Bariatric Medicine  Office located at: [address] [place], [place]  [address]      A) FOR THE CHRONIC DISEASE OF OBESITY: Morbid obesity (HCC)-start bmi 41.16/date 08/22/21 BMI 36.0-36.9,adult current 36.49  Chief complaint: Obesity Terri [name] is here to discuss her progress with her obesity treatment plan.   History of present illness / Interval history:  LADREA HOLLADAY is here today for her follow-up office visit.  Since last OV on 06/20/2024, pt is down 1 lb.  Pt has been compliant with meal plan. Averages from about 1160-1499 calories and 90-140g of protein.   06/20/24 15:00 07/25/24 15:00   Body Fat % 48.9 % 47.9 %  Muscle Mass (lbs) 110.6 lbs 111.8 lbs  Fat Mass (lbs) 111.2 lbs 108.4 lbs  Total Body Water (lbs) 90.2 lbs 85.6 lbs  Visceral Fat Rating  14 14  Counseling done on how various foods will affect these numbers and how to maximize success   Total lbs lost to date: -29 lbs Total Fat Mass in lbs lost to date:  Total weight loss percentage to date: -11.37 %   Nutrition Therapy She is journaling 1300-1400 calories and 90++ per grams per day and states she is following her eating plan approximately 80 % of the time.   - Tracking Calories/Macros: yes  - Eating More Whole Foods: yes  - Adequate Protein Intake: yes  - Adequate Water Intake: yes  - Skipping Meals: no -    - Sleeping 7-9 Hours/ Night: yes   Mackensie is currently in the action stage of change. As such, her goal is to continue weight management plan.  She has agreed to: continue current plan   Physical Activity Pt is walking 30  minutes 3-4 days per week   Monserat has been advised to work up to 300-450 minutes of moderate intensity aerobic activity a week and strengthening exercises 2-3 times per week for cardiovascular health, weight loss maintenance and preservation of muscle mass.  She has agreed to : Increase  physical activity in their day and reduce sedentary time (increase NEAT)., Increase the intensity, frequency or duration of strengthening exercises , Increase volume of physical activity to a goal of 240 minutes a week, and Combine aerobic and strengthening exercises for efficiency and improved cardiometabolic health.   Behavioral Modifications Evidence-based interventions for health behavior change were utilized today including the discussion of  1) self monitoring techniques:  journaling 2) problem-solving barriers:  none 3) self care:  exercise 4) SMART goals for next OV:  Increase exercise to 1 hour 3 days a week and on off days try to do some resistance training for 20-30 minutes. Maintain weight during the holidays. Regarding patient's less desirable eating habits and patterns, we employed the technique of small changes.   We discussed the following today: avoiding skipping meals, increasing water intake , work on tracking and journaling calories using tracking application, and continue to practice mindfulness when eating Additional resources provided today: Handout on December Goals   Medical Interventions/ Pharmacotherapy Previous Bariatric surgery: none Pharmacotherapy for weight loss: She is currently taking Metformin  500 mg twice daily and  Mounjaro  12.5 mg once weekly for medical weight loss.    We discussed various medication options to help Smt. with her weight loss efforts and we both agreed to : See T2DM note   B) OBESITY RELATED CONDITIONS ADDRESSED TODAY:  Type 2 diabetes mellitus in patient  with obesity [facility]) Assessment & Plan Lab Results  Component Value Date   HGBA1C 5.9 (H) 01/14/2024   HGBA1C 6.0 (H) 07/16/2023   HGBA1C 6.2 (H) 02/09/2023  Currently on Metformin  500 mg twice daily and Mounjaro  12.5 mg once weekly with good compliance and tolerance. Hunger and cravings are well controlled. Denies any side effects. A1c is well controlled. No acute concerns.  Despite pt increasing exercise, diligence with journaling  and vast majority of the time being at goal with her dietary intake, she is struggling to shed fat. Since she is tolerating Mounjaro  well with no side effects we will increase Mounjaro  dose to 15 mg once weekly. Cont decreasing simple carbs/sugars and increasing lean protein. Increase exercise as able/ F/u with PCP as needed.     Hyperlipidemia associated with type 2 diabetes mellitus [facility]) Assessment & Plan Lab Results  Component Value Date   CHOL 141 01/14/2024   HDL 62 01/14/2024   LDLCALC 61 01/14/2024   TRIG 97 01/14/2024   CHOLHDL 2.3 01/14/2024  Currently on Crestor  10 mg once daily with good compliance and tolerance. LDL, HDL, and TRIGs are WNL. No acute concerns today. Cont medication. Cont decreasing trans and saturated fats. Increase exercise as able.     Hypertension associated with type 2 diabetes mellitus [facility]) Assessment & Plan BP Readings from Last 3 Encounters:  07/25/24 126/77  07/12/24 123/74  06/20/24 125/74   The 10-year ASCVD risk score (Arnett DK, et al., 2019) is: 8.2%  Lab Results  Component Value Date   CREATININE 0.74 01/14/2024  Previously on amlodipine  but discontinued on 11/4. Is currently on valsartan -hydrochlorothiazide  1 tablet daily. BP is well controlled today. Cont to low-sodium, heart-healthy diet and increase water intake. Encouraged to f/u with PCP as needed.     Vitamin D  deficiency Assessment & Plan Lab Results  Component Value Date   VD25OH 44.8 02/01/2024   VD25OH CANCELED 01/14/2024   VD25OH 42.9 07/16/2023  Currently on Ergo 50 K units once every 10 days with good compliance and tolerance. Vit D levels are slightly sub optimal at 44.8; optimal 50-70. Cont regimen. Will recheck levels as necessary.       Medications Discontinued During This Encounter  Medication Reason   tirzepatide  (MOUNJARO ) 12.5 MG/0.5ML Pen      Meds ordered this encounter  Medications    tirzepatide  (MOUNJARO ) 15 MG/0.5ML Pen    Sig: Inject 15 mg into the skin once a week.    Dispense:  6 mL    Refill:  1      Follow up:   Return 08/22/2024 3:40 PM.  She was informed of the importance of frequent follow up visits to maximize her success with intensive lifestyle modifications for her multiple health conditions.   Weight Summary and Biometrics   Weight Lost Since Last Visit: 1lb  Weight Gained Since Last Visit: 0lb    Vitals Temp: 98 F (36.7 C) BP: 126/77 Pulse Rate: 82 SpO2: 98 %   Anthropometric Measurements Height: 5' 6 (1.676 m) Weight: 226 lb (102.5 kg) BMI (Calculated): 36.49 Weight at Last Visit: 227lb Weight Lost Since Last Visit: 1lb Weight Gained Since Last Visit: 0lb Starting Weight: 255lb Total Weight Loss (lbs): 29 lb (13.2 kg) Peak Weight: 260lb   Body Composition  Body Fat %: 47.9 % Fat Mass (lbs): 108.4 lbs Muscle Mass (lbs): 111.8 lbs Total Body Water (lbs): 85.6 lbs Visceral Fat Rating : 14   Other Clinical Data Fasting: no Labs: no Today's  Visit #: 57 Starting Date: 08/22/21    Objective:   PHYSICAL EXAM: Blood pressure 126/77, pulse 82, temperature 98 F (36.7 C), height 5' 6 (1.676 m), weight 226 lb (102.5 kg), SpO2 98%. Body mass index is 36.48 kg/m.  General: she is overweight, cooperative and in no acute distress. PSYCH: Has normal mood, affect and thought process.   HEENT: EOMI, sclerae are anicteric. Lungs: Normal breathing effort, no conversational dyspnea. Extremities: Moves * 4 Neurologic: A and O * 3, good insight  DIAGNOSTIC DATA REVIEWED: BMET    Component Value Date/Time   NA 138 01/14/2024 0827   K 4.3 01/14/2024 0827   CL 100 01/14/2024 0827   CO2 23 01/14/2024 0827   GLUCOSE 88 01/14/2024 0827   GLUCOSE 125 (H) 11/28/2017 2010   BUN 18 01/14/2024 0827   CREATININE 0.74 01/14/2024 0827   CALCIUM  9.9 01/14/2024 0827   GFRNONAA >60 11/28/2017 2010   GFRAA >60 11/28/2017 2010   Lab  Results  Component Value Date   HGBA1C 5.9 (H) 01/14/2024   HGBA1C 7.4 (H) 02/28/2021   No results found for: INSULIN  Lab Results  Component Value Date   TSH 1.140 05/01/2023   CBC    Component Value Date/Time   WBC 4.7 05/01/2023 1602   WBC 6.6 11/28/2017 2010   RBC 4.01 05/01/2023 1602   RBC 4.31 11/28/2017 2010   HGB 11.8 05/01/2023 1602   HCT 37.2 05/01/2023 1602   PLT 225 05/01/2023 1602   MCV 93 05/01/2023 1602   MCH 29.4 05/01/2023 1602   MCH 29.0 11/28/2017 2010   MCHC 31.7 05/01/2023 1602   MCHC 30.9 11/28/2017 2010   RDW 12.9 05/01/2023 1602   Iron Studies No results found for: IRON, TIBC, FERRITIN, IRONPCTSAT Lipid Panel     Component Value Date/Time   CHOL 141 01/14/2024 0827   TRIG 97 01/14/2024 0827   HDL 62 01/14/2024 0827   CHOLHDL 2.3 01/14/2024 0827   LDLCALC 61 01/14/2024 0827   Hepatic Function Panel     Component Value Date/Time   PROT 7.2 01/14/2024 0827   ALBUMIN 4.7 01/14/2024 0827   AST 20 01/14/2024 0827   ALT 20 01/14/2024 0827   ALKPHOS 56 01/14/2024 0827   BILITOT 0.2 01/14/2024 0827   BILIDIR 0.2 03/24/2016 0650   IBILI 0.5 03/24/2016 0650      Component Value Date/Time   TSH 1.140 05/01/2023 1602   Nutritional Lab Results  Component Value Date   VD25OH 44.8 02/01/2024   VD25OH CANCELED 01/14/2024   VD25OH 42.9 07/16/2023    Attestations:   I, Feliciano Mingle, acting as a stage manager for [employer], DO., have compiled all relevant documentation for today's office visit on behalf of Barnie Jenkins, DO, while in the presence of [employer], DO.    I have reviewed the above documentation for accuracy and completeness, and I agree with the above. Barnie JINNY Jenkins, D.O.  The 21st Century Cures Act was signed into law in 2016 which includes the topic of electronic health records.  This provides immediate access to information in MyChart.  This includes consultation notes, operative notes, office notes,  lab results and pathology reports.  If you have any questions about what you read please let us  know at your next visit so we can discuss your concerns and take corrective action if need be.  We are right here with you.  "

## 2024-07-26 ENCOUNTER — Other Ambulatory Visit (HOSPITAL_COMMUNITY): Payer: Self-pay

## 2024-07-26 ENCOUNTER — Telehealth (HOSPITAL_COMMUNITY): Payer: Self-pay

## 2024-07-26 ENCOUNTER — Other Ambulatory Visit: Payer: Self-pay

## 2024-07-26 NOTE — Telephone Encounter (Signed)
 Pharmacy Patient Advocate Encounter   Received notification from Patient Pharmacy that prior authorization for Mounjaro  15MG /0.5ML auto-injectors  is required/requested.   Insurance verification completed.   The patient is insured through [facility].   Per test claim: PA required; PA submitted to above mentioned insurance via Latent Key/confirmation #/EOC [id number] Status is pending

## 2024-07-27 ENCOUNTER — Other Ambulatory Visit: Payer: Self-pay | Admitting: Internal Medicine

## 2024-07-27 ENCOUNTER — Other Ambulatory Visit (HOSPITAL_COMMUNITY): Payer: Self-pay

## 2024-07-27 DIAGNOSIS — L309 Dermatitis, unspecified: Secondary | ICD-10-CM

## 2024-07-27 NOTE — Telephone Encounter (Signed)
 Pharmacy Patient Advocate Encounter  Received notification from [facility] that Prior Authorization for Mounjaro  15MG /0.5ML auto-injectors  has been APPROVED from 07/26/24 to 07/27/27. Ran test claim, Copay is $25. This test claim was processed through [facility]- copay amounts may vary at other pharmacies due to pharmacy/plan contracts, or as the patient moves through the different stages of their insurance plan.   PA #/Case ID/Reference #: [id number]

## 2024-08-22 ENCOUNTER — Other Ambulatory Visit (HOSPITAL_COMMUNITY): Payer: Self-pay

## 2024-08-22 ENCOUNTER — Encounter (INDEPENDENT_AMBULATORY_CARE_PROVIDER_SITE_OTHER): Payer: Self-pay | Admitting: Family Medicine

## 2024-08-22 ENCOUNTER — Ambulatory Visit (INDEPENDENT_AMBULATORY_CARE_PROVIDER_SITE_OTHER): Admitting: Family Medicine

## 2024-08-22 DIAGNOSIS — Z7984 Long term (current) use of oral hypoglycemic drugs: Secondary | ICD-10-CM | POA: Diagnosis not present

## 2024-08-22 DIAGNOSIS — Z6836 Body mass index (BMI) 36.0-36.9, adult: Secondary | ICD-10-CM | POA: Diagnosis not present

## 2024-08-22 DIAGNOSIS — E1169 Type 2 diabetes mellitus with other specified complication: Secondary | ICD-10-CM

## 2024-08-22 DIAGNOSIS — I10 Essential (primary) hypertension: Secondary | ICD-10-CM | POA: Diagnosis not present

## 2024-08-22 DIAGNOSIS — E559 Vitamin D deficiency, unspecified: Secondary | ICD-10-CM

## 2024-08-22 DIAGNOSIS — E1159 Type 2 diabetes mellitus with other circulatory complications: Secondary | ICD-10-CM | POA: Diagnosis not present

## 2024-08-22 DIAGNOSIS — Z7985 Long-term (current) use of injectable non-insulin antidiabetic drugs: Secondary | ICD-10-CM | POA: Diagnosis not present

## 2024-08-22 DIAGNOSIS — E669 Obesity, unspecified: Secondary | ICD-10-CM

## 2024-08-22 MED ORDER — VITAMIN D (ERGOCALCIFEROL) 1.25 MG (50000 UNIT) PO CAPS
50000.0000 [IU] | ORAL_CAPSULE | ORAL | 0 refills | Status: AC
  Filled 2024-08-22: qty 9, 90d supply, fill #0

## 2024-08-22 NOTE — Progress Notes (Signed)
 "  [name], D.O.  ABFM, ABOM Specializing in Clinical Bariatric Medicine  Office located at: [address] [place], [place]  [address]      Orders Placed This Encounter  Procedures   VITAMIN D  25 Hydroxy (Vit-D Deficiency, Fractures)   TSH   T4, free   Comprehensive metabolic panel with GFR   Lipid panel    Medications Discontinued During This Encounter  Medication Reason   Vitamin D , Ergocalciferol , (DRISDOL ) 1.25 MG (50000 UNIT) CAPS capsule Reorder     Meds ordered this encounter  Medications   Vitamin D , Ergocalciferol , (DRISDOL ) 1.25 MG (50000 UNIT) CAPS capsule    Sig: Take 1 capsule by mouth every 10 days    Dispense:  9 capsule    Refill:  0      A) FOR THE CHRONIC DISEASE OF OBESITY:  Morbid obesity (HCC)-start bmi 41.16 BMI 36.0-36.9,adult current 36.33  Chief complaint: Obesity Terri [name] is here to discuss her progress with her obesity treatment plan.   History of present illness / Interval history:  Terri [name] is here today for her follow-up office visit.  Since last OV on 07/25/2024, pt is down 1 lb.    07/25/24 15:00 08/22/24 15:00   Body Fat % 47.9 % 47.7 %  Muscle Mass (lbs) 111.8 lbs 112 lbs  Fat Mass (lbs) 108.4 lbs 107.6 lbs  Total Body Water (lbs) 85.6 lbs 85.4 lbs  Visceral Fat Rating  14 14  Counseling done on how various foods will affect these numbers and how to maximize success   Total lbs lost to date: -30 lbs Total Fat Mass in lbs lost to date: -20.4 Total weight loss percentage to date: -11.76 %   Nutrition Therapy She is journaling 1300-1400 calories and 90++ per grams per day and states she is following her eating plan approximately 80 % of the time.   - Tracking Calories/Macros: yes  - Eating More Whole Foods: yes  - Adequate Protein Intake: yes  - Adequate Water Intake: yes; gets in about 80 oz of water  - Skipping Meals: no  - Sleeping 7-9 Hours/ Night: yes   Ayen is currently in the  action stage of change. As such, her goal is to continue weight management plan.  She has agreed to: continue current plan   Physical Activity Pt is walking 30-45  minutes 3 days per week   Cardelia has been advised to work up to 300-450 minutes of moderate intensity aerobic activity a week and strengthening exercises 2-3 times per week for cardiovascular health, weight loss maintenance and preservation of muscle mass.  She has agreed to : Continue to gradually increase the amount and intensity of exercise routine, Increase volume of physical activity to a goal of 240 minutes a week, and Combine aerobic and strengthening exercises for efficiency and improved cardiometabolic health.   Behavioral Modifications Evidence-based interventions for health behavior change were utilized today including the discussion of  1) self monitoring techniques:  Journaling, meal plan and prepping. 2) self care:  exercise 3) SMART goals for next OV:  Add strength training to exercise regimen and increase water intake.   Regarding patient's less desirable eating habits and patterns, we employed the technique of small changes.   We discussed the following today: increasing lean protein intake to established goals, work on meal planning and preparation, keeping healthy foods at home, continue to work on implementation of reduced calorie nutritional plan, and continue to practice mindfulness when  eating Additional resources provided today: Handout on Daily Food Journaling Log   Medical Interventions/ Pharmacotherapy Previous Bariatric surgery: none Pharmacotherapy for weight loss: She is currently taking Metformin  500 mg twice daily and Mounjaro  15 mg once weekly for medical weight loss.    We discussed various medication options to help Terri [name] with her weight loss efforts and we both agreed to : Continue with current nutritional and behavioral strategies   B) OBESITY RELATED CONDITIONS ADDRESSED  TODAY:  CHECK CMP, VITD, TSH, AND LIPID PANEL AT NEXT OV  Type 2 diabetes mellitus in patient with obesity Terri [name]) Assessment & Plan  Lab Results  Component Value Date   HGBA1C 5.9 (H) 01/14/2024   HGBA1C 6.0 (H) 07/16/2023   HGBA1C 6.2 (H) 02/09/2023  Currently on Metformin  500 mg twice daily and Mounjaro  15 mg once weekly with good compliance and tolerance. Denies side effects. Hunger and cravings are well controlled and reports that when she eats on-plan food she feels satiated. No acute concerns today. Cont decreasing simple carbs/sugars and increasing lean protein. Increase exercise as able.    Hypertension associated with type 2 diabetes mellitus (HCC) Assessment & Plan Last 3 blood pressure readings in our office are as follows: BP Readings from Last 3 Encounters:  08/22/24 110/73  07/25/24 126/77  07/12/24 123/74   The 10-year ASCVD risk score (Arnett DK, et al., 2019) is: 5.4%  Lab Results  Component Value Date   CREATININE 0.74 01/14/2024  Is currently on valsartan -hydrochlorothiazide  1 tablet daily. BP is well controlled today. Cont low-sodium, heart-healthy diet. Cont adequate hydration. F/u with PCP as needed.     Vitamin D  deficiency Assessment & Plan Lab Results  Component Value Date   VD25OH 44.8 02/01/2024   VD25OH CANCELED 01/14/2024   VD25OH 42.9 07/16/2023  Currently on Ergo 50 K units once every 10 days with good compliance and tolerance. No acute concerns. Cont regimen(refill today). Will recheck levels as necessary.     Medications Discontinued During This Encounter  Medication Reason   Vitamin D , Ergocalciferol , (DRISDOL ) 1.25 MG (50000 UNIT) CAPS capsule Reorder     Meds ordered this encounter  Medications   Vitamin D , Ergocalciferol , (DRISDOL ) 1.25 MG (50000 UNIT) CAPS capsule    Sig: Take 1 capsule by mouth every 10 days    Dispense:  9 capsule    Refill:  0      Follow up:   Return 09/26/2024 3:40 PM.  She was informed of the  importance of frequent follow up visits to maximize her success with intensive lifestyle modifications for her multiple health conditions.  Terri [name] is aware that we will review all of her lab results at our next visit together in person.  She is aware that if anything is critical/ life threatening with the results, we will be contacting her via MyChart or by my CMA will be calling them prior to the office visit to discuss acute management.     Weight Summary and Biometrics   Weight Lost Since Last Visit: 1lb  Weight Gained Since Last Visit: 0lb    Vitals Temp: 97.9 F (36.6 C) BP: 110/73 Pulse Rate: 85 SpO2: 98 %   Anthropometric Measurements Height: 5' 6 (1.676 m) Weight: 225 lb (102.1 kg) BMI (Calculated): 36.33 Weight at Last Visit: 226lb Weight Lost Since Last Visit: 1lb Weight Gained Since Last Visit: 0lb Starting Weight: 255lb Total Weight Loss (lbs): 30 lb (13.6 kg) Peak Weight: 260lb   Body Composition  Body Fat %:  47.7 % Fat Mass (lbs): 107.6 lbs Muscle Mass (lbs): 112 lbs Total Body Water (lbs): 85.4 lbs Visceral Fat Rating : 14   Other Clinical Data Fasting: no Labs: no Today's Visit #: 39 Starting Date: 08/22/21    Objective:   PHYSICAL EXAM: Blood pressure 110/73, pulse 85, temperature 97.9 F (36.6 C), height 5' 6 (1.676 m), weight 225 lb (102.1 kg), SpO2 98%. Body mass index is 36.32 kg/m.  General: she is overweight, cooperative and in no acute distress. PSYCH: Has normal mood, affect and thought process.   HEENT: EOMI, sclerae are anicteric. Lungs: Normal breathing effort, no conversational dyspnea. Extremities: Moves * 4 Neurologic: A and O * 3, good insight  DIAGNOSTIC DATA REVIEWED: BMET    Component Value Date/Time   NA 138 01/14/2024 0827   K 4.3 01/14/2024 0827   CL 100 01/14/2024 0827   CO2 23 01/14/2024 0827   GLUCOSE 88 01/14/2024 0827   GLUCOSE 125 (H) 11/28/2017 2010   BUN 18 01/14/2024 0827    CREATININE 0.74 01/14/2024 0827   CALCIUM  9.9 01/14/2024 0827   GFRNONAA >60 11/28/2017 2010   GFRAA >60 11/28/2017 2010   Lab Results  Component Value Date   HGBA1C 5.9 (H) 01/14/2024   HGBA1C 7.4 (H) 02/28/2021   No results found for: INSULIN  Lab Results  Component Value Date   TSH 1.140 05/01/2023   CBC    Component Value Date/Time   WBC 4.7 05/01/2023 1602   WBC 6.6 11/28/2017 2010   RBC 4.01 05/01/2023 1602   RBC 4.31 11/28/2017 2010   HGB 11.8 05/01/2023 1602   HCT 37.2 05/01/2023 1602   PLT 225 05/01/2023 1602   MCV 93 05/01/2023 1602   MCH 29.4 05/01/2023 1602   MCH 29.0 11/28/2017 2010   MCHC 31.7 05/01/2023 1602   MCHC 30.9 11/28/2017 2010   RDW 12.9 05/01/2023 1602   Iron Studies No results found for: IRON, TIBC, FERRITIN, IRONPCTSAT Lipid Panel     Component Value Date/Time   CHOL 141 01/14/2024 0827   TRIG 97 01/14/2024 0827   HDL 62 01/14/2024 0827   CHOLHDL 2.3 01/14/2024 0827   LDLCALC 61 01/14/2024 0827   Hepatic Function Panel     Component Value Date/Time   PROT 7.2 01/14/2024 0827   ALBUMIN 4.7 01/14/2024 0827   AST 20 01/14/2024 0827   ALT 20 01/14/2024 0827   ALKPHOS 56 01/14/2024 0827   BILITOT 0.2 01/14/2024 0827   BILIDIR 0.2 03/24/2016 0650   IBILI 0.5 03/24/2016 0650      Component Value Date/Time   TSH 1.140 05/01/2023 1602   Nutritional Lab Results  Component Value Date   VD25OH 44.8 02/01/2024   VD25OH CANCELED 01/14/2024   VD25OH 42.9 07/16/2023    Attestations:   I, Feliciano Mingle, acting as a stage manager for [employer], DO., have compiled all relevant documentation for today's office visit on behalf of Barnie Jenkins, DO, while in the presence of [employer], DO.   I have reviewed the above documentation for accuracy and completeness, and I agree with the above. Barnie JINNY Jenkins, D.O.  The 21st Century Cures Act was signed into law in 2016 which includes the topic of electronic health  records.  This provides immediate access to information in MyChart.  This includes consultation notes, operative notes, office notes, lab results and pathology reports.  If you have any questions about what you read please let us  know at your next visit so we  can discuss your concerns and take corrective action if need be.  We are right here with you.  "

## 2024-09-19 ENCOUNTER — Ambulatory Visit (INDEPENDENT_AMBULATORY_CARE_PROVIDER_SITE_OTHER): Admitting: Family Medicine

## 2024-09-20 ENCOUNTER — Ambulatory Visit: Admitting: Obstetrics

## 2024-09-26 ENCOUNTER — Ambulatory Visit (INDEPENDENT_AMBULATORY_CARE_PROVIDER_SITE_OTHER): Admitting: Family Medicine

## 2024-10-24 ENCOUNTER — Ambulatory Visit (INDEPENDENT_AMBULATORY_CARE_PROVIDER_SITE_OTHER): Admitting: Family Medicine

## 2024-12-12 ENCOUNTER — Ambulatory Visit
